# Patient Record
Sex: Female | Born: 1960
Health system: Southern US, Community
[De-identification: ages and names within clinical notes are randomized; demographics above are authoritative.]

## PROBLEM LIST (undated history)

## (undated) DIAGNOSIS — G43909 Migraine, unspecified, not intractable, without status migrainosus: Secondary | ICD-10-CM

## (undated) DIAGNOSIS — K219 Gastro-esophageal reflux disease without esophagitis: Secondary | ICD-10-CM

## (undated) DIAGNOSIS — F5104 Psychophysiologic insomnia: Secondary | ICD-10-CM

## (undated) DIAGNOSIS — IMO0001 Reserved for inherently not codable concepts without codable children: Secondary | ICD-10-CM

## (undated) HISTORY — DX: Reserved for inherently not codable concepts without codable children: IMO0001

## (undated) HISTORY — DX: Migraine, unspecified, not intractable, without status migrainosus: G43.909

## (undated) HISTORY — DX: Psychophysiologic insomnia: F51.04

## (undated) HISTORY — DX: Gastro-esophageal reflux disease without esophagitis: K21.9

## (undated) HISTORY — PX: APPENDECTOMY: SHX54

## (undated) HISTORY — PX: CHOLECYSTECTOMY: SHX55

---

## 2001-01-28 ENCOUNTER — Encounter: Payer: Self-pay | Admitting: Orthopaedic Surgery

## 2001-01-28 ENCOUNTER — Emergency Department (HOSPITAL_COMMUNITY): Admission: EM | Admit: 2001-01-28 | Discharge: 2001-01-28 | Payer: Self-pay | Admitting: *Deleted

## 2001-01-28 ENCOUNTER — Encounter: Payer: Self-pay | Admitting: Internal Medicine

## 2001-05-07 ENCOUNTER — Other Ambulatory Visit: Admission: RE | Admit: 2001-05-07 | Discharge: 2001-05-07 | Payer: Self-pay | Admitting: Obstetrics and Gynecology

## 2002-03-01 ENCOUNTER — Ambulatory Visit (HOSPITAL_COMMUNITY): Admission: RE | Admit: 2002-03-01 | Discharge: 2002-03-01 | Payer: Self-pay | Admitting: Family Medicine

## 2002-03-01 ENCOUNTER — Encounter: Payer: Self-pay | Admitting: Family Medicine

## 2002-03-15 ENCOUNTER — Ambulatory Visit (HOSPITAL_COMMUNITY): Admission: RE | Admit: 2002-03-15 | Discharge: 2002-03-15 | Payer: Self-pay | Admitting: Family Medicine

## 2002-03-15 ENCOUNTER — Encounter: Payer: Self-pay | Admitting: Family Medicine

## 2003-07-26 ENCOUNTER — Encounter: Payer: Self-pay | Admitting: Family Medicine

## 2003-07-26 ENCOUNTER — Ambulatory Visit (HOSPITAL_COMMUNITY): Admission: RE | Admit: 2003-07-26 | Discharge: 2003-07-26 | Payer: Self-pay | Admitting: Family Medicine

## 2004-10-14 HISTORY — PX: VAGINAL HYSTERECTOMY: SUR661

## 2004-10-17 ENCOUNTER — Observation Stay (HOSPITAL_COMMUNITY): Admission: RE | Admit: 2004-10-17 | Discharge: 2004-10-18 | Payer: Self-pay | Admitting: Obstetrics & Gynecology

## 2006-05-06 ENCOUNTER — Ambulatory Visit (HOSPITAL_COMMUNITY): Admission: RE | Admit: 2006-05-06 | Discharge: 2006-05-06 | Payer: Self-pay | Admitting: Family Medicine

## 2006-05-06 IMAGING — CR DG SHOULDER 2+V*L*
3 series · 3 of 3 positions shown · non-contrast
Comparison: none

CLINICAL DATA: Pain in left shoulder. 
 LEFT SHOULDER ? 3 VIEW:

[view not recorded (1 of 3)]
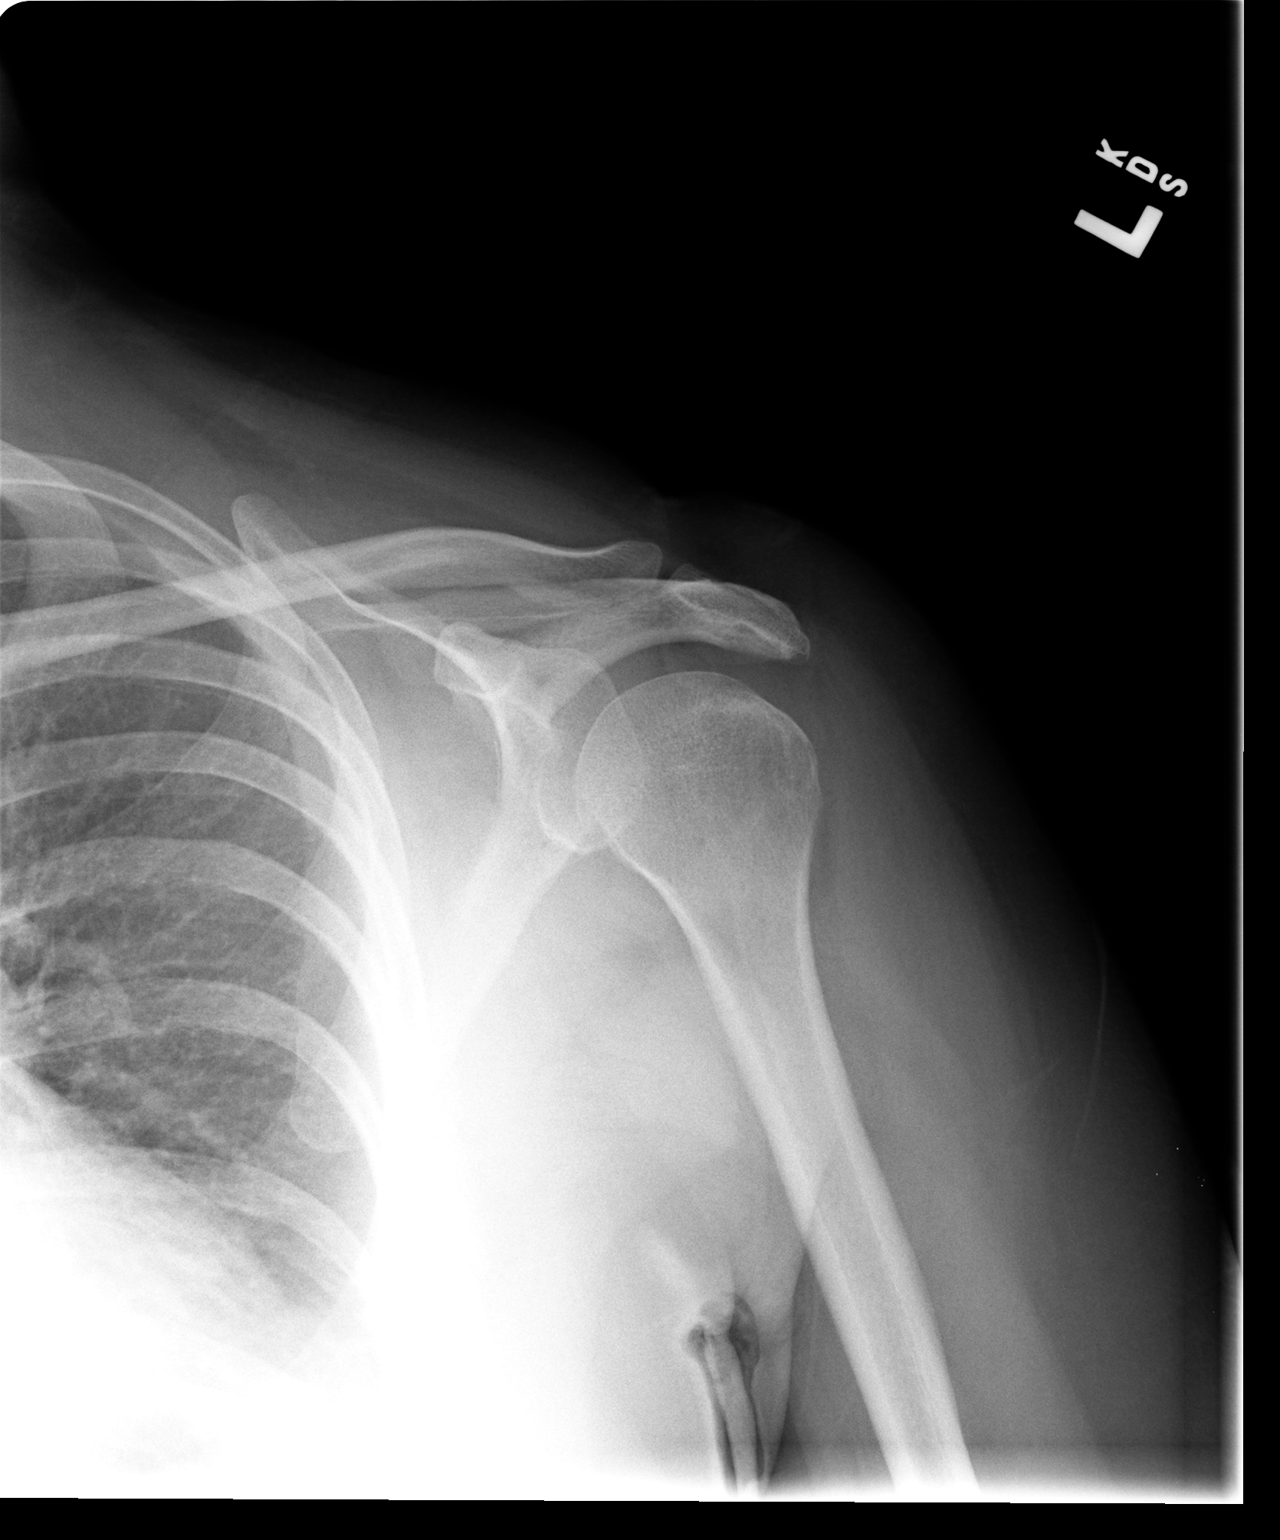

[view not recorded (2 of 3)]
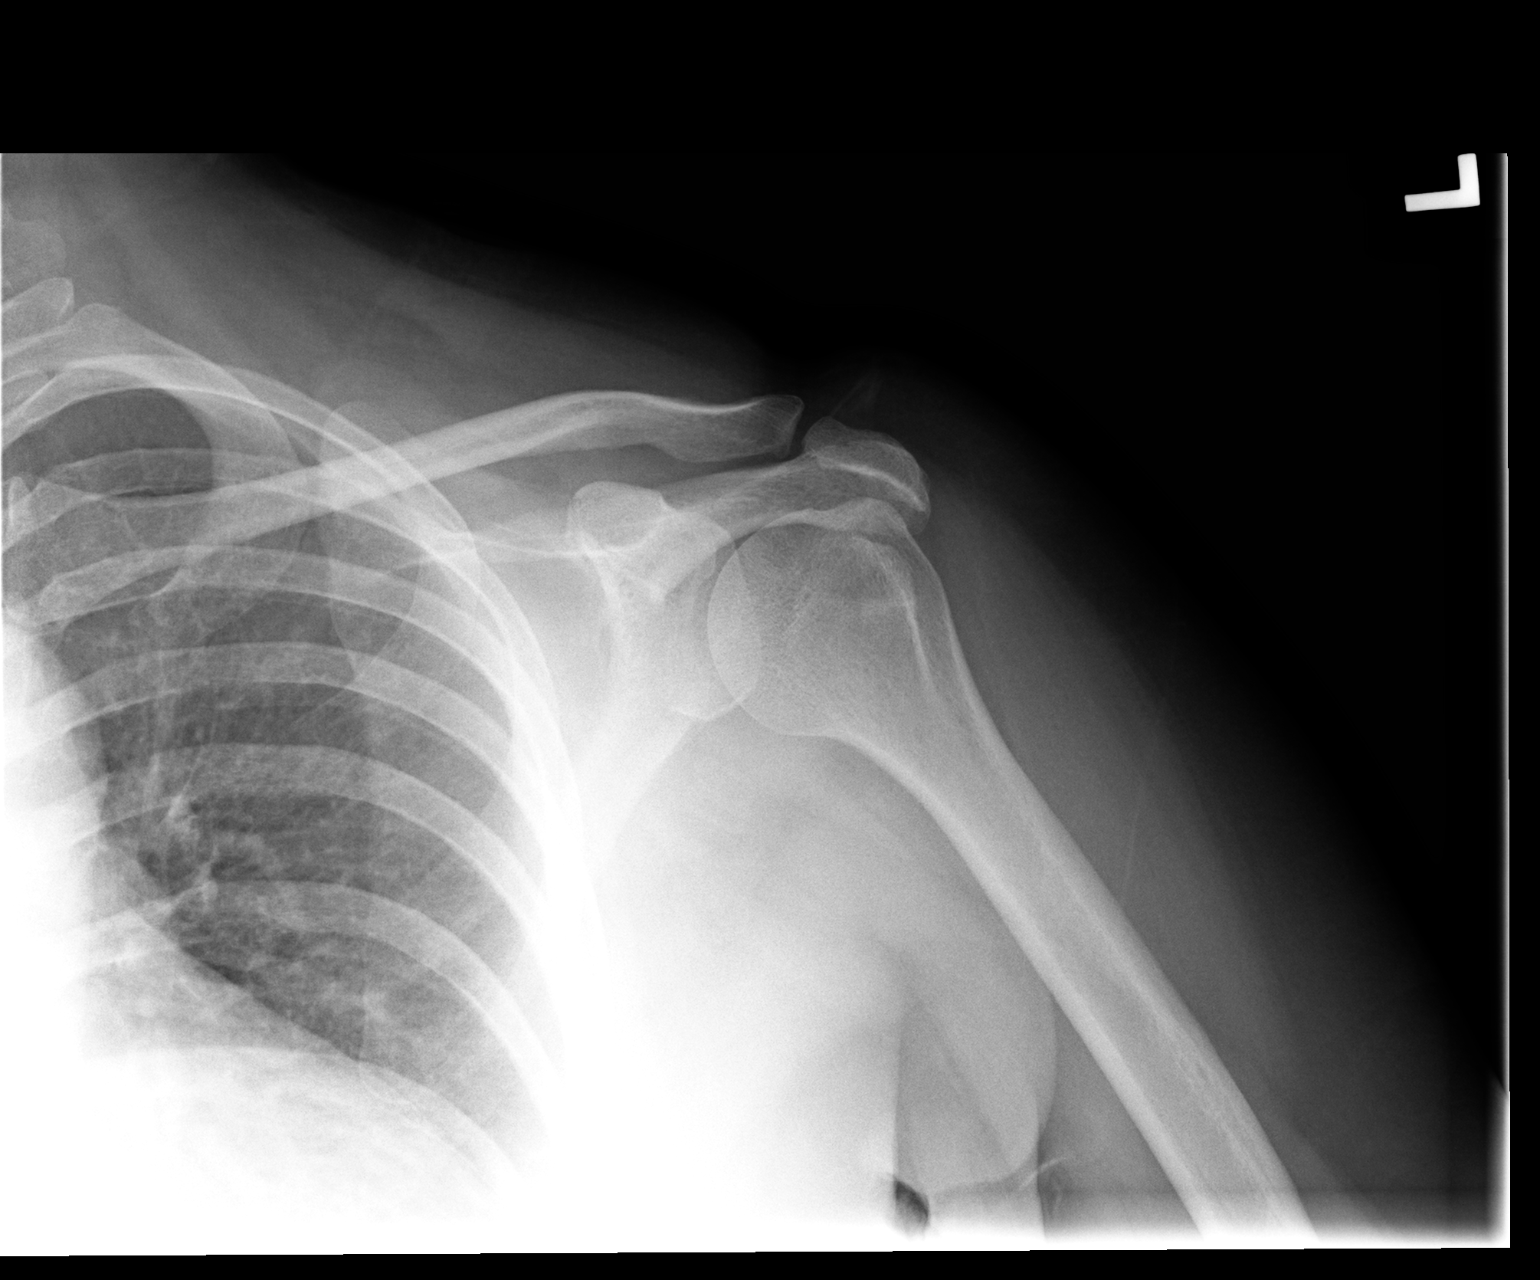

[view not recorded (3 of 3)]
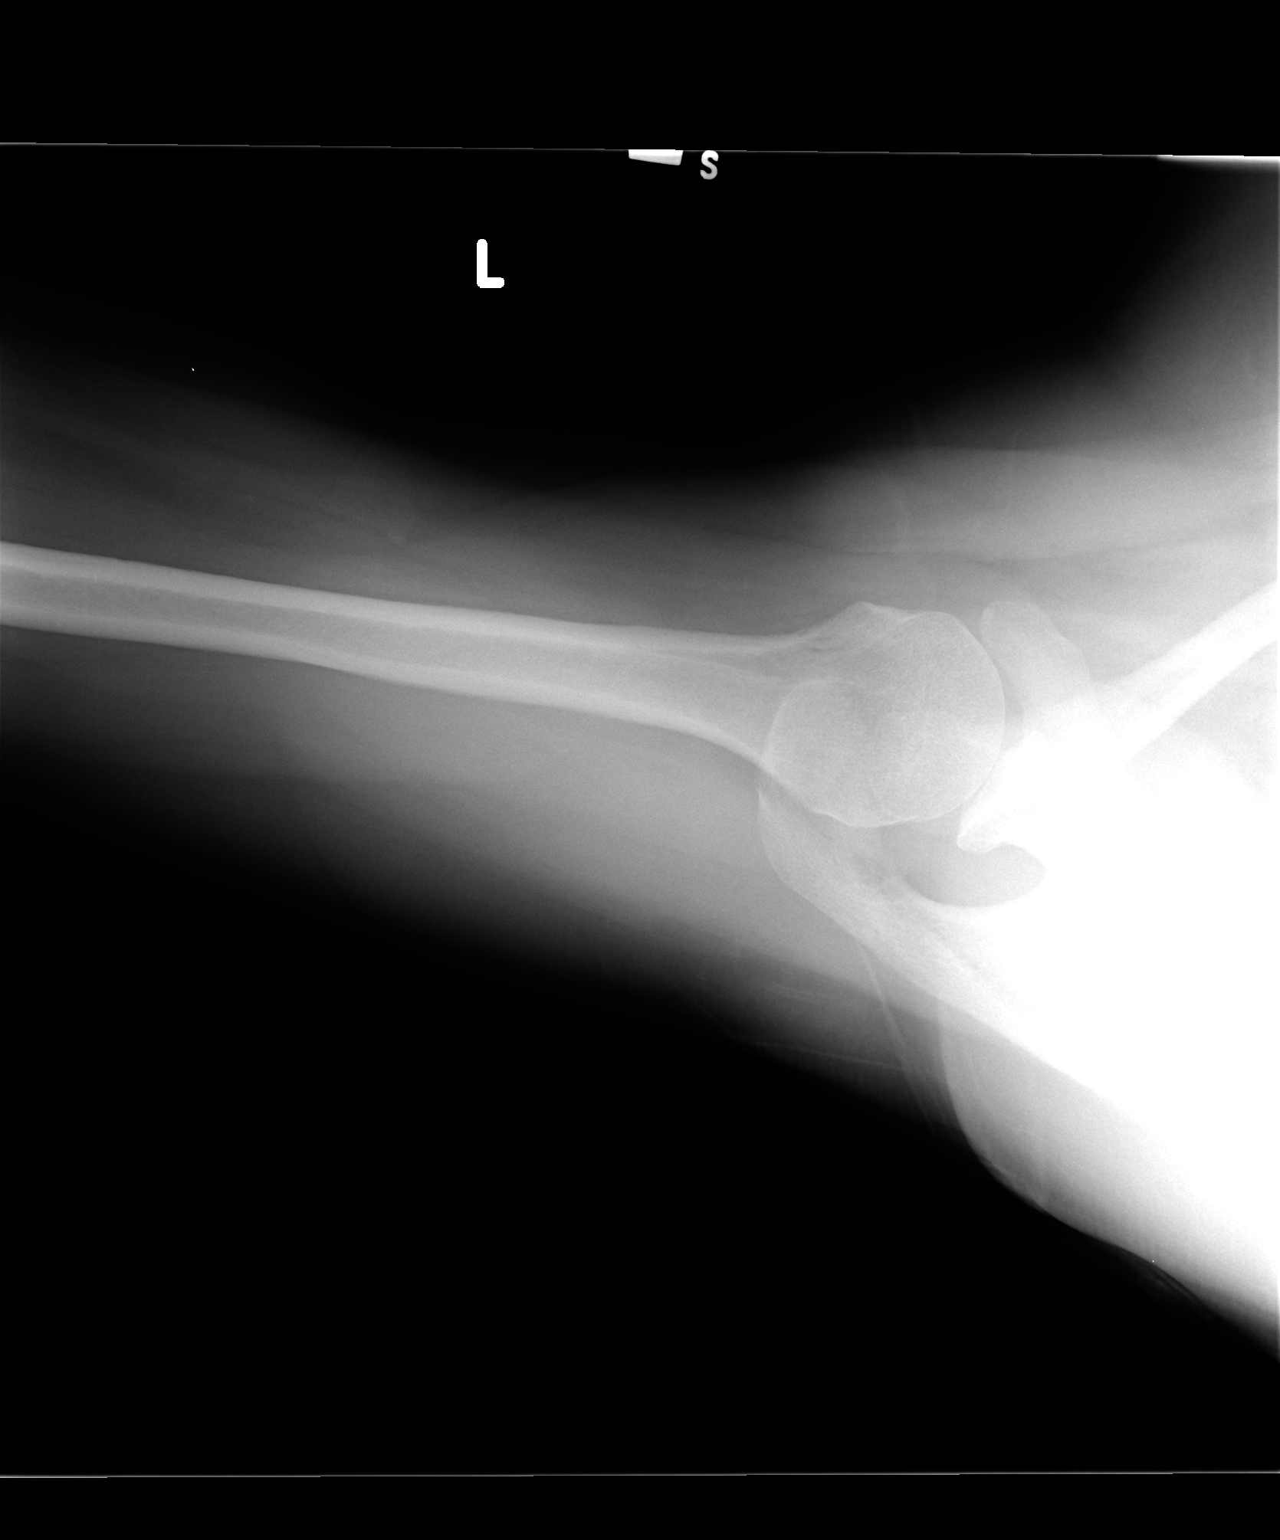

[3 of 3 positions shown; findings below may reference images not displayed]

FINDINGS: The humerus is located.  No fracture is identified.  Imaged ribs and lung are clear.  Some mild sclerosis in the humeral head at the greater tuberosity suggesting rotator cuff disease.  Mild acromioclavicular degenerative changes also noted.
IMPRESSION: No acute finding with mild acromioclavicular degenerative change and evidence of rotator cuff tendinopathy.

## 2006-09-29 ENCOUNTER — Inpatient Hospital Stay (HOSPITAL_COMMUNITY): Admission: EM | Admit: 2006-09-29 | Discharge: 2006-10-01 | Payer: Self-pay | Admitting: Emergency Medicine

## 2006-09-29 IMAGING — US US ABDOMEN COMPLETE
1 series · 14 of 25 positions shown · non-contrast
Comparison: None.

CLINICAL DATA: Chest pain, right upper quadrant pain.  Nausea, rule out gallbladder disease.
 ABDOMEN ULTRASOUND:
TECHNIQUE: Complete abdominal ultrasound examination was performed including evaluation of the liver, gallbladder, bile ducts, pancreas, kidneys, spleen, IVC, and abdominal aorta.

[Series 1: unknown · 0.34mm/px · 14 of 84 slices shown]
[im 1/84]
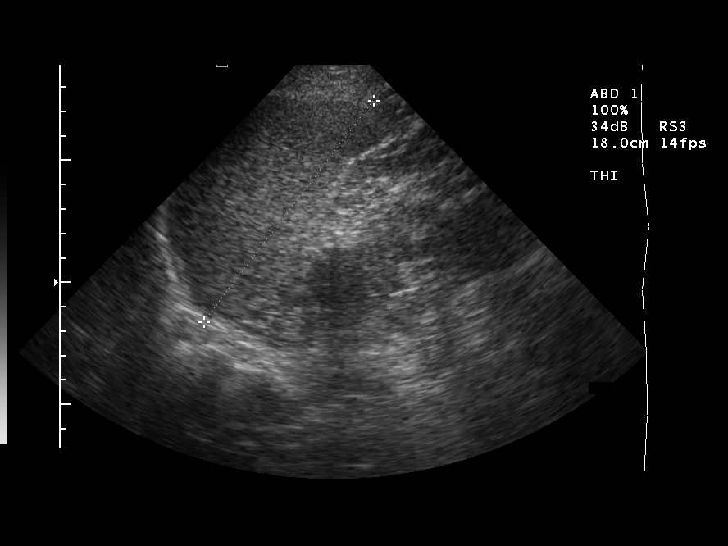
[im 7/84]
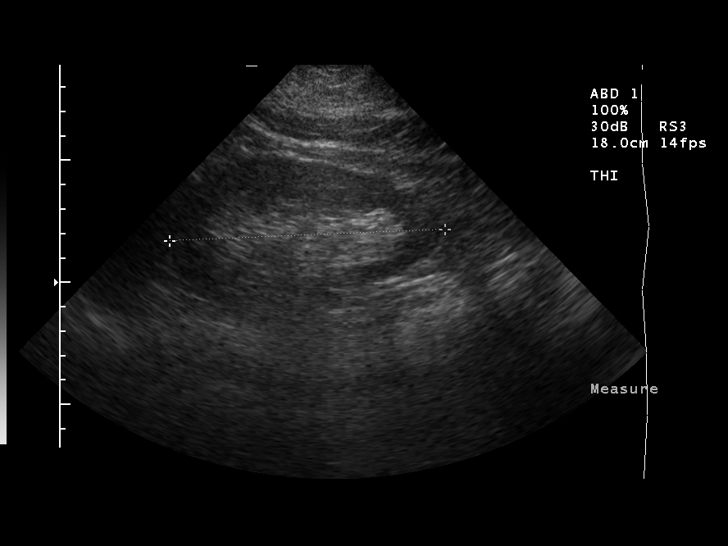
[im 14/84]
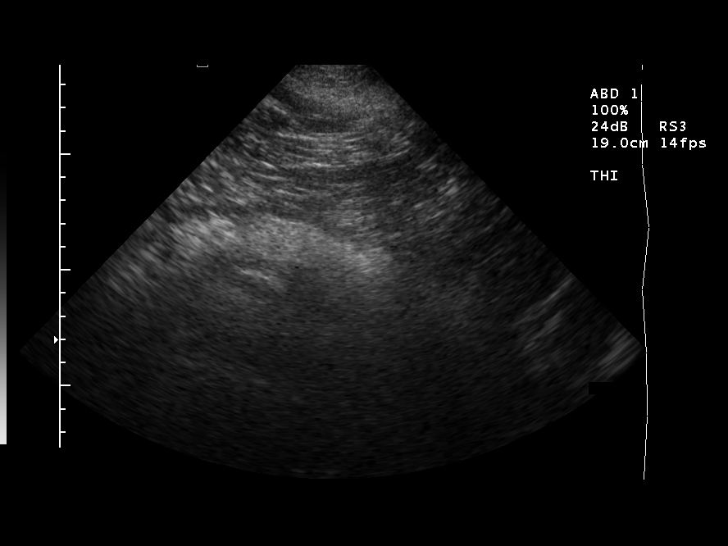
[im 21/84]
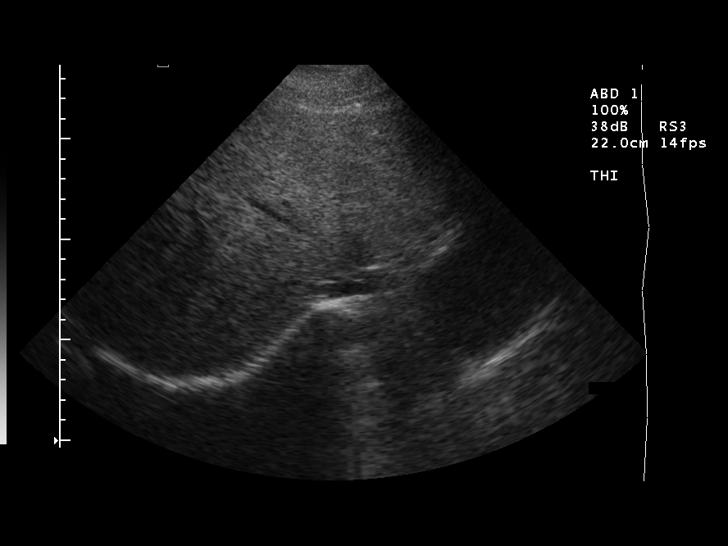
[im 28/84]
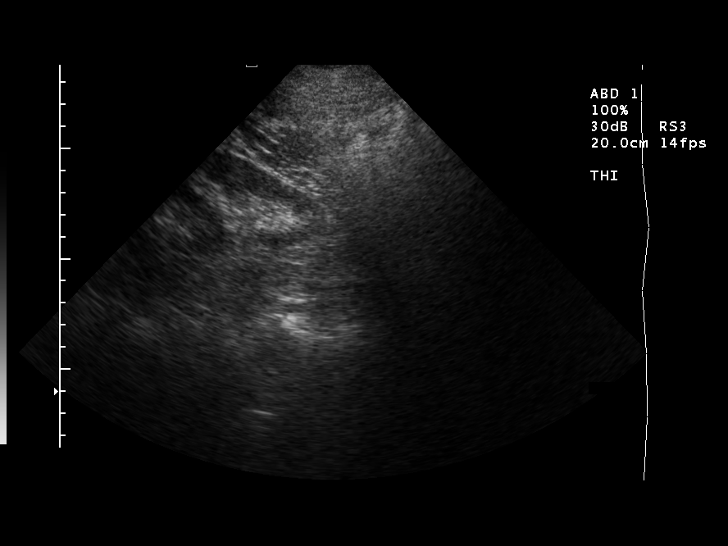
[im 32/84]
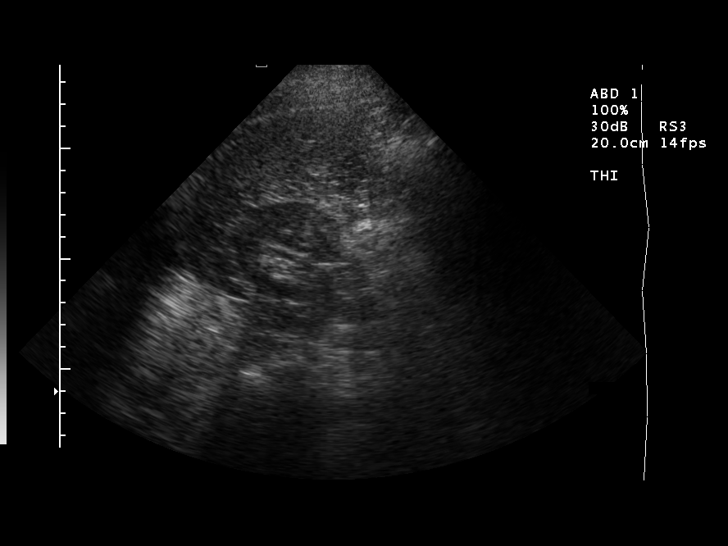
[im 39/84]
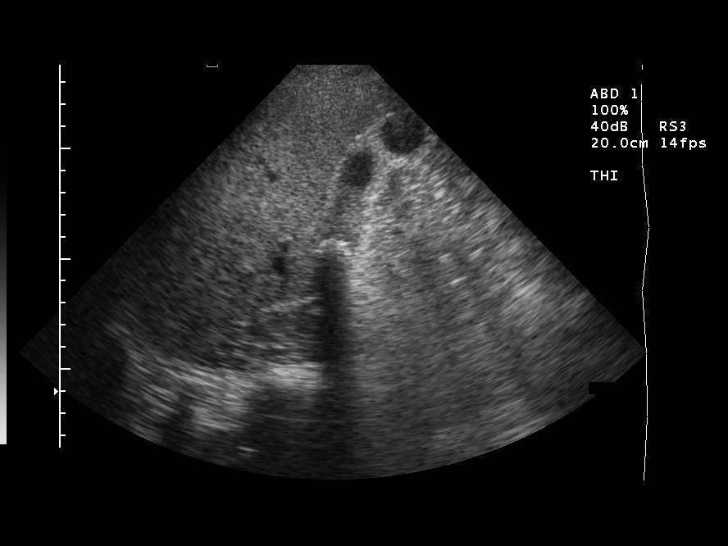
[im 45/84]
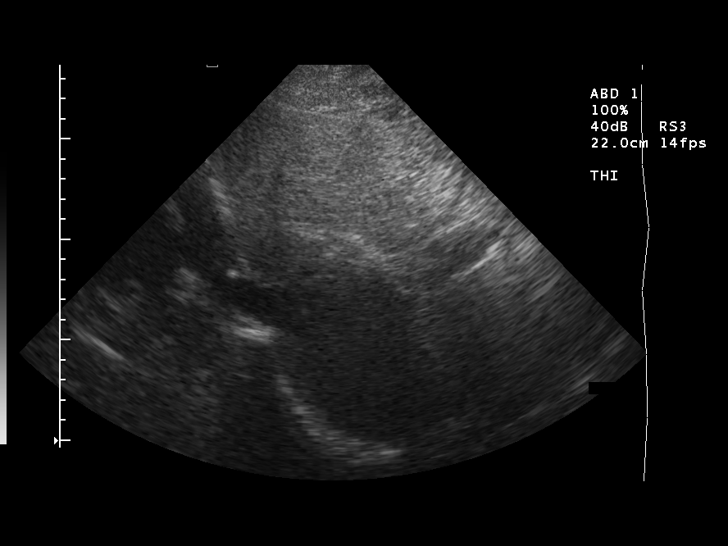
[im 52/84]
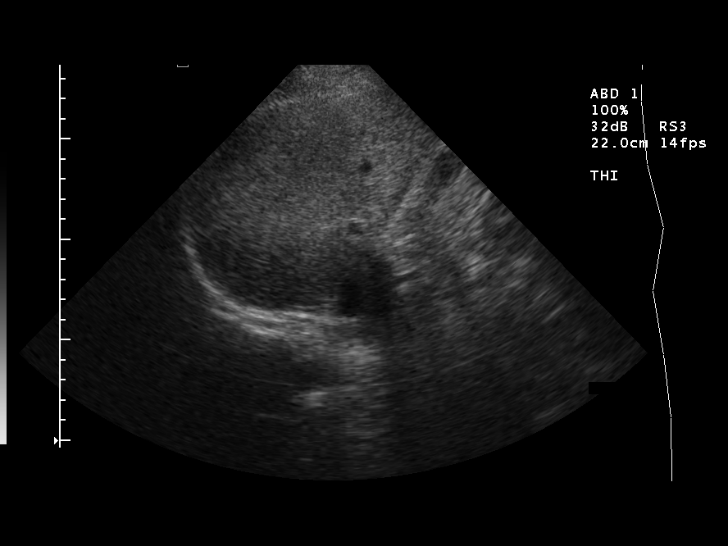
[im 56/84]
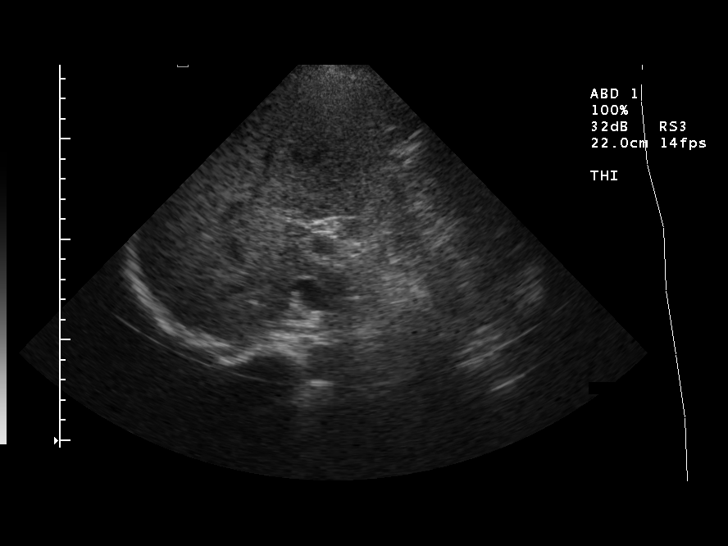
[im 63/84]
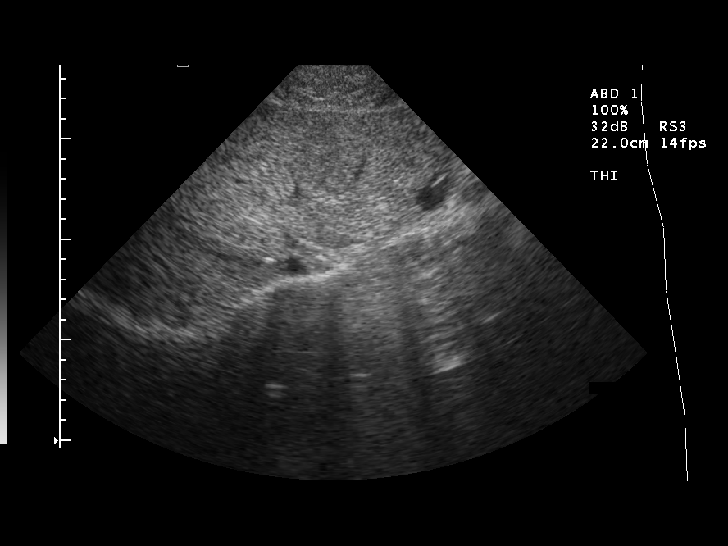
[im 70/84]
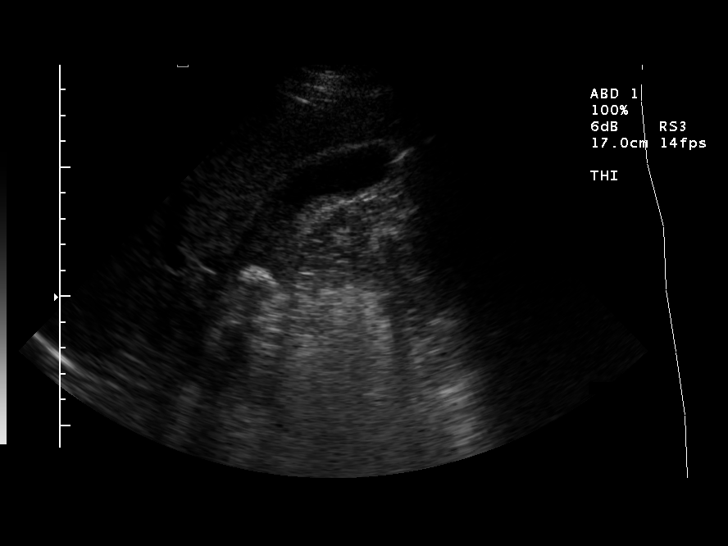
[im 77/84]
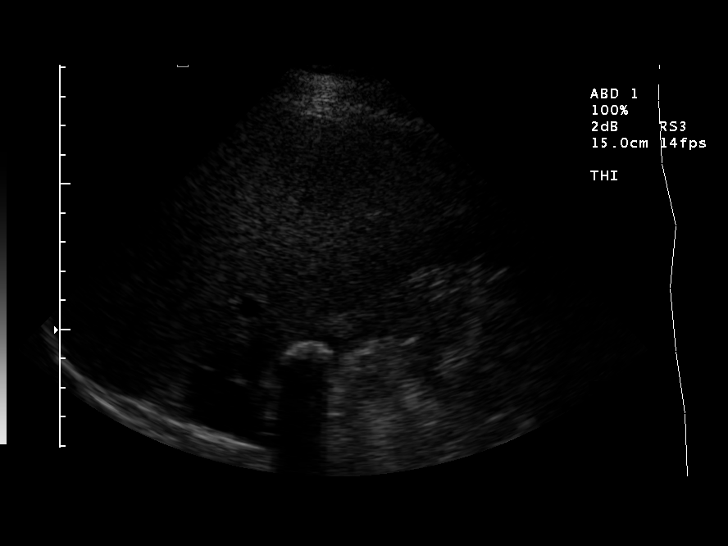
[im 84/84]
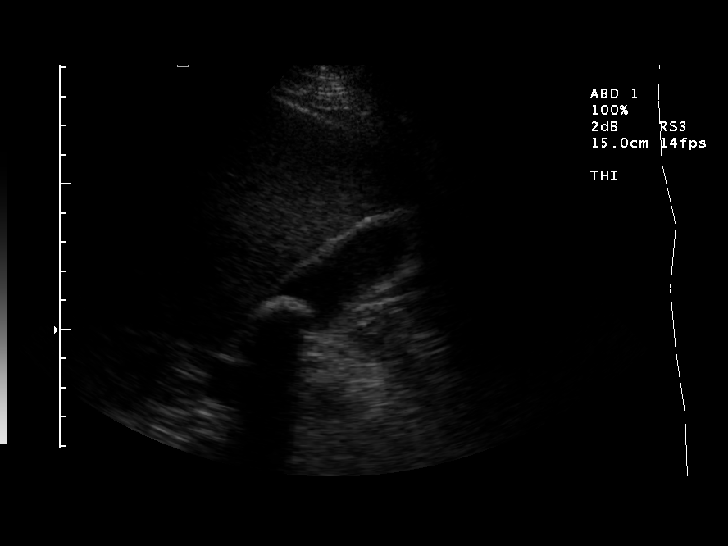

[14 of 25 positions shown; findings below may reference images not displayed]

FINDINGS: There is a large, approximately 2 cm, stone within the gallbladder neck.  There is no evidence of wall thickening, pericholecystic fluid, or sonographic Murphy?s sign. The common duct is at the upper limits of normal measuring 5 mm.
 Liver is of increased echogenicity, consistent with fatty infiltration.  There is left liver lobe 1.7 cm hepatic cyst.  
 The IVC is normal.  Pancreatic head and proximal body are normal.  The pancreatic tail is poorly visualized.  
 The spleen is normal in size and echo texture. 
 The right kidney is 11.0 and the left kidney is 11.3 cm.  No hydronephrosis.  
 The abdominal aorta is non aneurysmal.
IMPRESSION: 1.  Cholelithiasis without cholecystitis.  A 2 cm stone is seen within the gallbladder neck.
 2.  Fatty infiltration of the liver with a left liver lobe cyst.  
 3.  No biliary ductal dilatation.

## 2006-09-29 IMAGING — CR DG CHEST 1V PORT
1 series · 1 of 1 positions shown · non-contrast
Comparison: None available.

CLINICAL DATA: Chest pain.
 PORTABLE CHEST - 1 VIEW [DATE]:

[view not recorded]
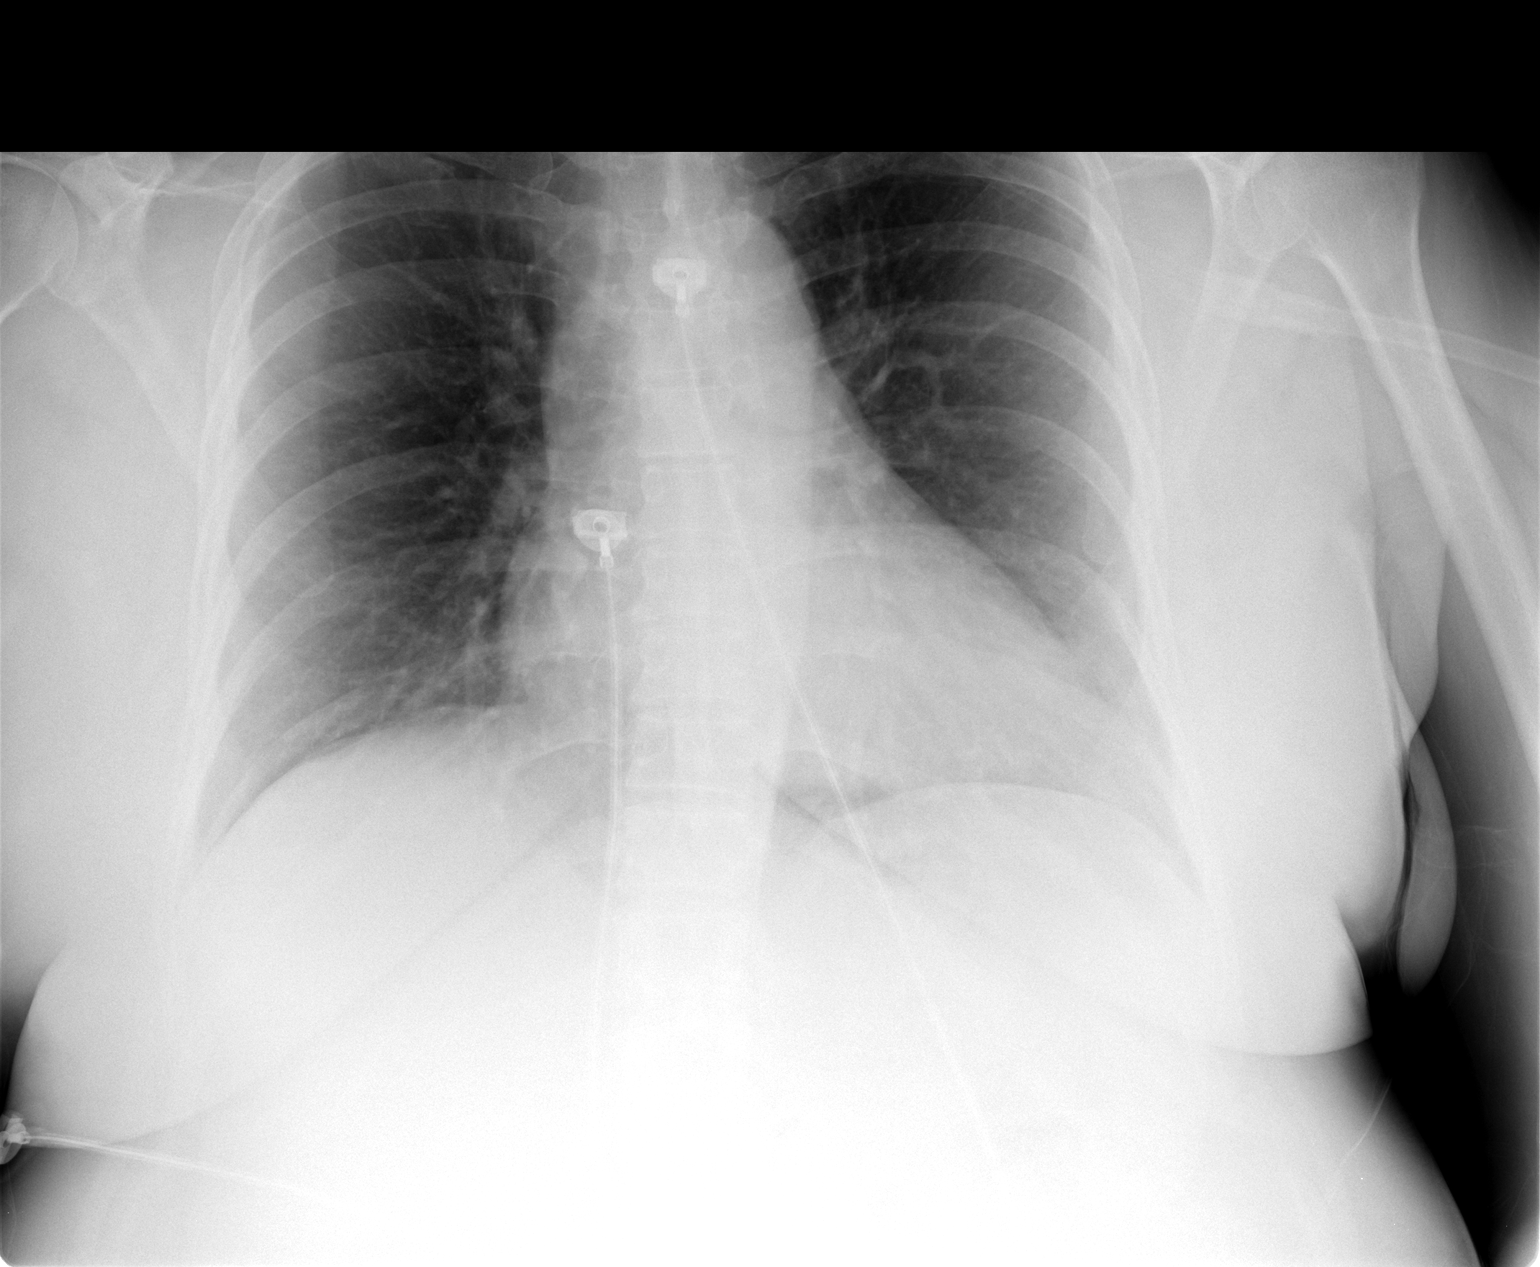

[1 of 1 positions shown; findings below may reference images not displayed]

FINDINGS: The lungs are clear and the costophrenic angles are sharp.  Heart size is normal.  No focal bony abnormality.
IMPRESSION: No acute disease.

## 2011-01-10 ENCOUNTER — Other Ambulatory Visit (HOSPITAL_COMMUNITY): Payer: Self-pay | Admitting: Family Medicine

## 2011-01-10 DIAGNOSIS — D171 Benign lipomatous neoplasm of skin and subcutaneous tissue of trunk: Secondary | ICD-10-CM

## 2011-01-16 ENCOUNTER — Encounter (HOSPITAL_COMMUNITY): Payer: Self-pay

## 2011-01-16 ENCOUNTER — Ambulatory Visit (HOSPITAL_COMMUNITY)
Admission: RE | Admit: 2011-01-16 | Discharge: 2011-01-16 | Disposition: A | Discharge status: Home / Self Care | Payer: Managed Care, Other (non HMO) | Source: Ambulatory Visit | Attending: Family Medicine | Admitting: Family Medicine

## 2011-01-16 ENCOUNTER — Other Ambulatory Visit (HOSPITAL_COMMUNITY): Payer: Self-pay | Admitting: Family Medicine

## 2011-01-16 DIAGNOSIS — D171 Benign lipomatous neoplasm of skin and subcutaneous tissue of trunk: Secondary | ICD-10-CM

## 2011-01-16 DIAGNOSIS — N63 Unspecified lump in unspecified breast: Secondary | ICD-10-CM | POA: Insufficient documentation

## 2011-01-16 IMAGING — US US BREAST*R*
1 series · 11 of 11 positions shown · non-contrast
Comparison: None.

CLINICAL DATA: Due for annual mammogram.   Palpable mass right
lateral chest wall, probable lipoma.

DIGITAL DIAGNOSTIC BILATERAL MAMMOGRAM WITH CAD AND RIGHT BREAST
ULTRASOUND:

[Series 1: us breast*right* · 0.08mm/px · 11 of 11 slices shown]
[im 1/11]
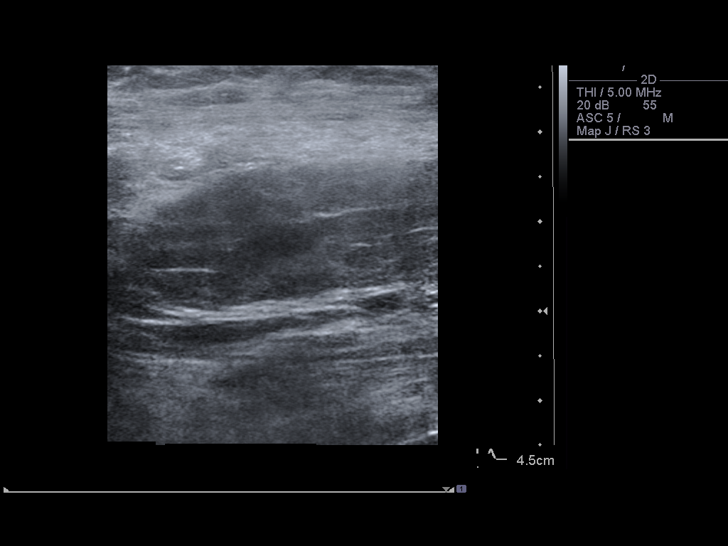
[im 2/11]
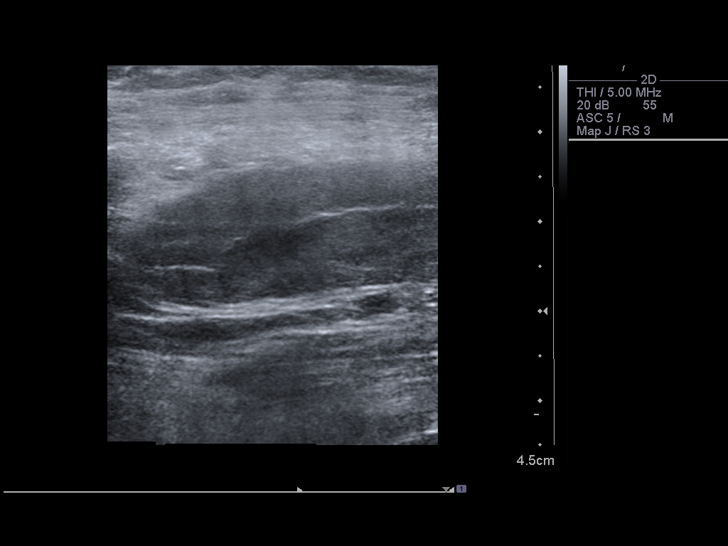
[im 3/11]
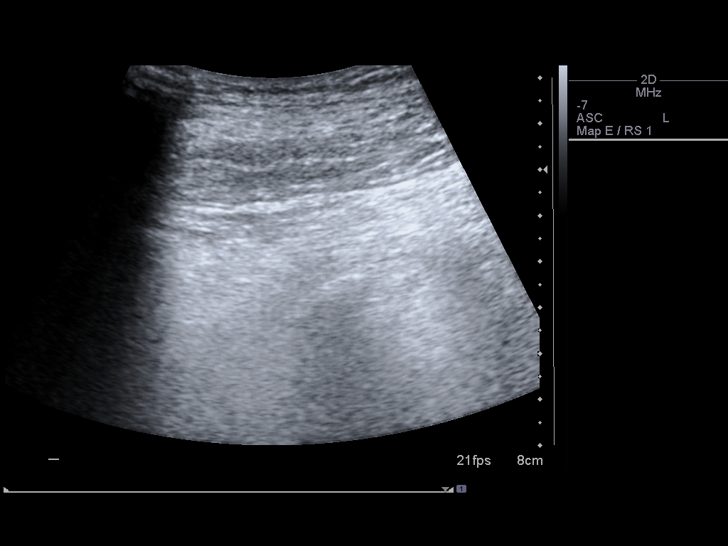
[im 4/11]
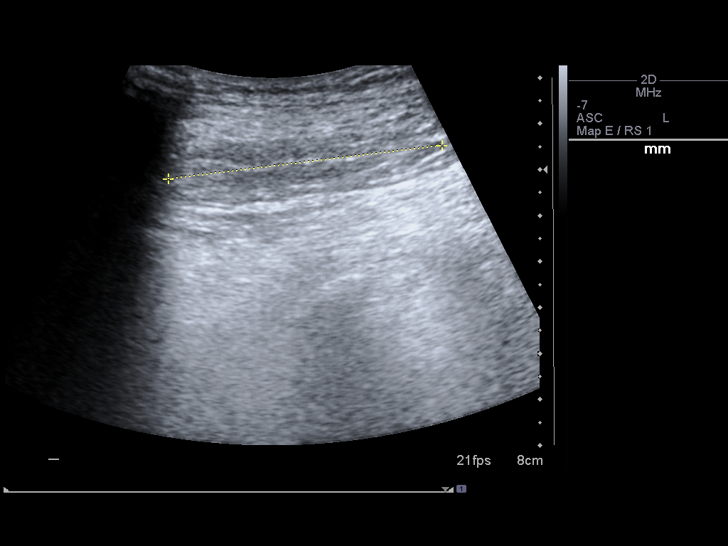
[im 5/11]
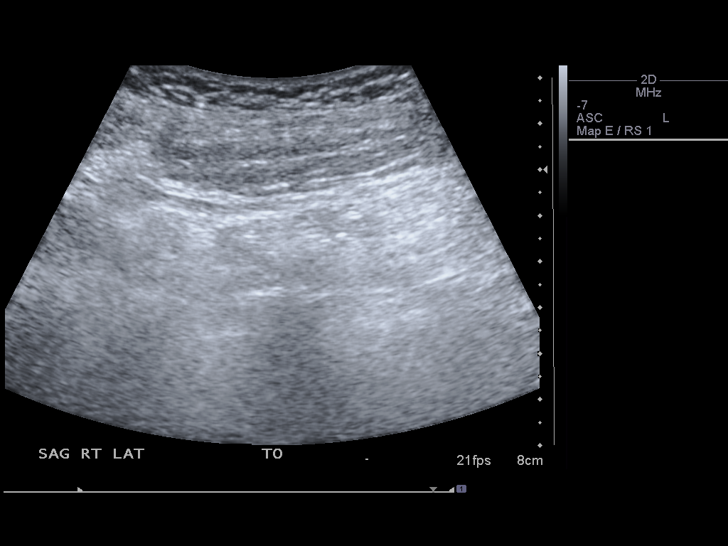
[im 6/11]
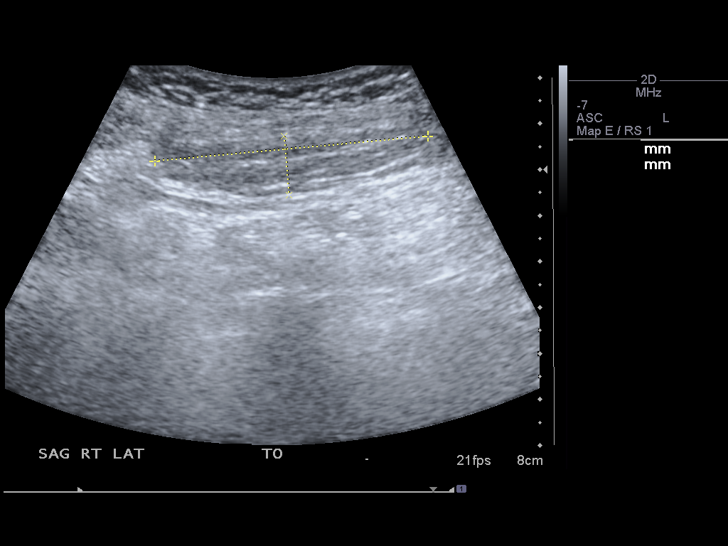
[im 7/11]
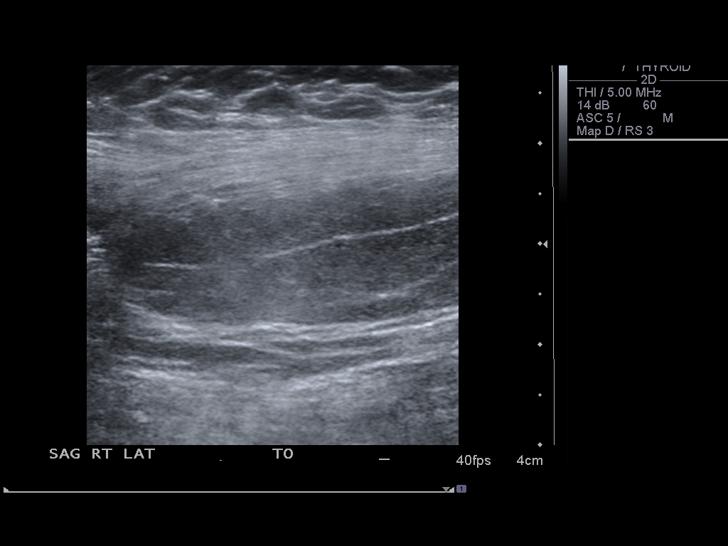
[im 8/11]
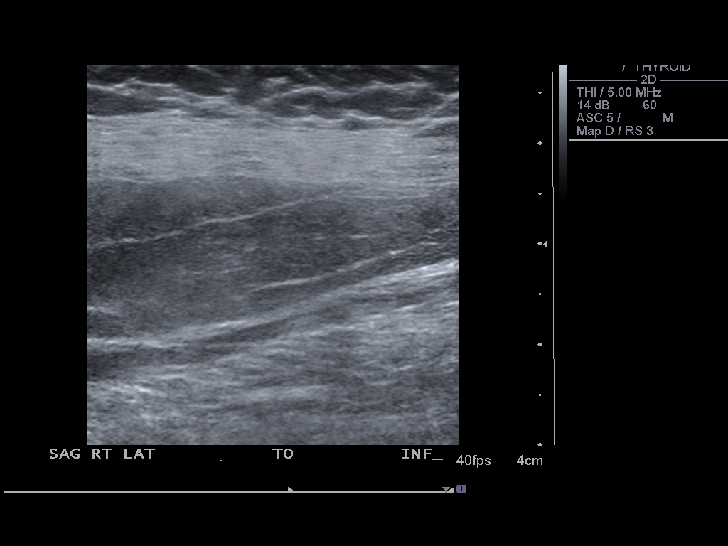
[im 9/11]
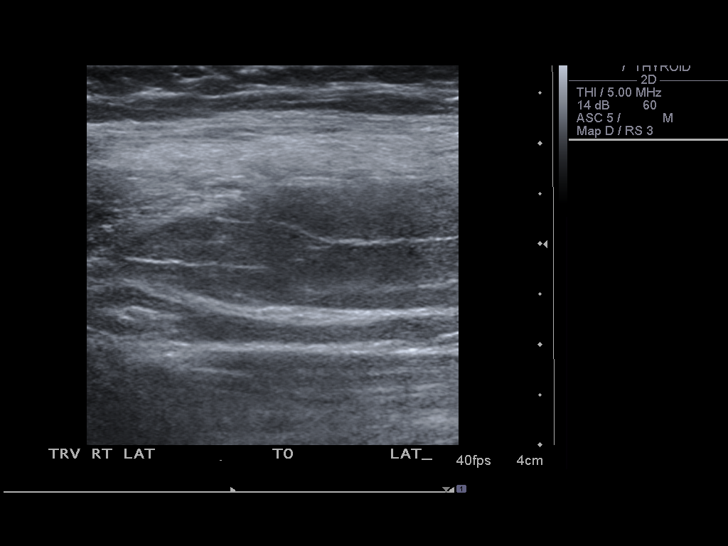
[im 10/11]
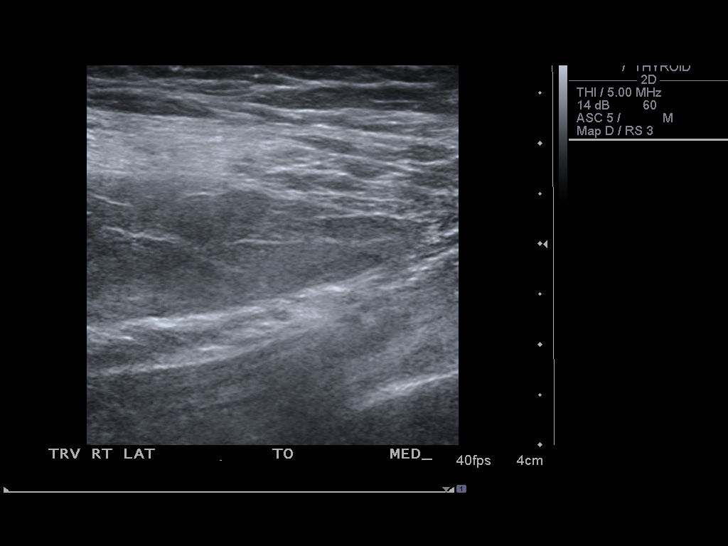
[im 11/11]
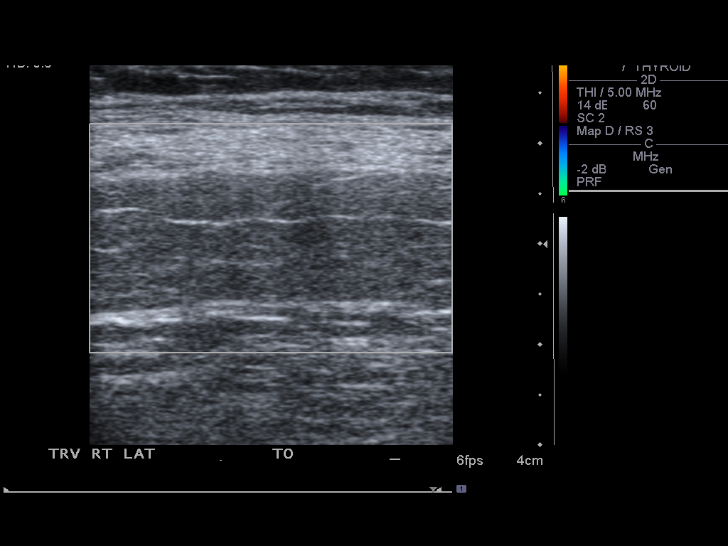

[11 of 11 positions shown; findings below may reference images not displayed]

FINDINGS: The breast parenchyma is heterogeneously dense
bilaterally.  No mass, distortion, or suspicious microcalcification
is identified in either breast to suggest malignancy. The area of
patient palpable concern along the right chest wall is not included
in the mammogram, due to its far lateral position.

Mammographic images were processed with CAD.

On physical exam, I palpate a discrete oval smoothly marginated and
mobile mass in the subcutaneous tissues of the lateral right chest
wall, directly inferior to the lateral right axilla. On physical
examination, the mass measures 6 to 7 cm.

Ultrasound is performed, showing an oval smoothly marginated mass,
isoechoic to subcutaneous fat within the subcutaneous tissues of
the right lateral chest wall.  The mass is oriented parallel to the
chest wall and contains internal echogenic bands.  The mass is
estimated be 6.0 x 6.0 x 1.3 cm.  No internal vascular flow is
identified.
IMPRESSION: 1.  No evidence of malignancy in either breast.
2.  Palpable mass lateral right chest wall measures approximately
6.0 cm and has ultrasound features compatible with a benign lipoma.

Bilateral screening mammogram in one years recommended.

BI-RADS CATEGORY 2:  Benign finding(s).

## 2012-10-14 HISTORY — PX: LAPAROSCOPIC BILATERAL SALPINGO OOPHERECTOMY: SHX5890

## 2013-03-17 ENCOUNTER — Encounter: Payer: Self-pay | Admitting: *Deleted

## 2013-03-18 ENCOUNTER — Encounter: Payer: Self-pay | Admitting: Nurse Practitioner

## 2013-03-18 ENCOUNTER — Ambulatory Visit (INDEPENDENT_AMBULATORY_CARE_PROVIDER_SITE_OTHER): Payer: Managed Care, Other (non HMO) | Admitting: Nurse Practitioner

## 2013-03-18 VITALS — BP 116/80 | Temp 98.8°F | Wt 201.8 lb

## 2013-03-18 DIAGNOSIS — R7301 Impaired fasting glucose: Secondary | ICD-10-CM

## 2013-03-18 DIAGNOSIS — G43909 Migraine, unspecified, not intractable, without status migrainosus: Secondary | ICD-10-CM | POA: Insufficient documentation

## 2013-03-18 DIAGNOSIS — R300 Dysuria: Secondary | ICD-10-CM

## 2013-03-18 DIAGNOSIS — R81 Glycosuria: Secondary | ICD-10-CM

## 2013-03-18 LAB — POCT URINALYSIS DIPSTICK
Glucose, UA: 50
Spec Grav, UA: >=1.03
pH, UA: 5

## 2013-03-18 LAB — POCT UA - MICROSCOPIC ONLY
Bacteria, U Microscopic: 0
Crystals, Ur, HPF, POC: 0
Epithelial cells, urine per micros: 0
Mucus, UA: 0
RBC, urine, microscopic: 0
WBC, Ur, HPF, POC: 0

## 2013-03-18 LAB — GLUCOSE, POCT (MANUAL RESULT ENTRY): POC Glucose: 193 mg/dl — AB (ref 70–99)

## 2013-03-18 LAB — POCT GLYCOSYLATED HEMOGLOBIN (HGB A1C): Hemoglobin A1C: 5.7

## 2013-03-18 MED ORDER — NITROFURANTOIN MONOHYD MACRO 100 MG PO CAPS: 100.0000 mg | ORAL_CAPSULE | Freq: Two times a day (BID) | ORAL | Status: DC

## 2013-03-18 NOTE — Progress Notes (Signed)
Subjective:  Presents with complaints of urinary pressure frequency and urgency that began about a week ago. Patient had some antibiotics at home which she took for several days, symptoms have almost resolved. Thinks the medicine was clindamycin. Some low back pain. No fever. No CVA area tenderness. No history of recent UTI. Same sexual partner. Less than 2 hours ago patient had a chicken wrap and sweet tea. Note she has had an appendectomy.  Objective:   BP 116/80  Temp(Src) 98.8 F (37.1 C) (Oral)  Wt 201 lb 12.8 oz (91.536 kg) NAD. Alert, oriented. Significant central obesity noted. Lungs clear. No CVA area tenderness. Heart regular rate rhythm. Abdomen soft nondistended with suprapubic area tenderness on exam. Urine dipstick shows positive for glucose. Nonfasting blood sugar 193, hemoglobin A1c 5.7. Urine microscopic negative.  Assessment:Dysuria - Plan: POCT urinalysis dipstick, POCT UA - Microscopic Only  Impaired fasting glucose - Plan: POCT glycosylated hemoglobin (Hb A1C), POCT glucose (manual entry)  Glycosuria  possible impaired fasting glucose, recommend FBS Plan: Macrobid 100 mg 1 by mouth twice a day x5 days. Discussed importance of weight loss particularly around the mid section. Increase activity. Healthy diet including decreasing sugar and simple carbs. Recommend checking a fasting blood sugar sometime in the next few months.

## 2013-03-25 ENCOUNTER — Telehealth: Payer: Self-pay | Admitting: *Deleted

## 2013-03-25 NOTE — Telephone Encounter (Signed)
Not available

## 2013-04-08 DIAGNOSIS — Z029 Encounter for administrative examinations, unspecified: Secondary | ICD-10-CM

## 2013-04-22 ENCOUNTER — Emergency Department (HOSPITAL_COMMUNITY)
Admission: EM | Admit: 2013-04-22 | Discharge: 2013-04-22 | Disposition: A | Discharge status: Home / Self Care | Payer: Managed Care, Other (non HMO) | Attending: Emergency Medicine | Admitting: Emergency Medicine

## 2013-04-22 ENCOUNTER — Emergency Department (HOSPITAL_COMMUNITY): Payer: Managed Care, Other (non HMO)

## 2013-04-22 ENCOUNTER — Encounter (HOSPITAL_COMMUNITY): Payer: Self-pay

## 2013-04-22 DIAGNOSIS — Z90710 Acquired absence of both cervix and uterus: Secondary | ICD-10-CM | POA: Insufficient documentation

## 2013-04-22 DIAGNOSIS — R110 Nausea: Secondary | ICD-10-CM | POA: Insufficient documentation

## 2013-04-22 DIAGNOSIS — Z7982 Long term (current) use of aspirin: Secondary | ICD-10-CM | POA: Insufficient documentation

## 2013-04-22 DIAGNOSIS — Z8719 Personal history of other diseases of the digestive system: Secondary | ICD-10-CM | POA: Insufficient documentation

## 2013-04-22 DIAGNOSIS — N83209 Unspecified ovarian cyst, unspecified side: Secondary | ICD-10-CM

## 2013-04-22 DIAGNOSIS — G43909 Migraine, unspecified, not intractable, without status migrainosus: Secondary | ICD-10-CM | POA: Insufficient documentation

## 2013-04-22 DIAGNOSIS — K219 Gastro-esophageal reflux disease without esophagitis: Secondary | ICD-10-CM | POA: Insufficient documentation

## 2013-04-22 DIAGNOSIS — G4700 Insomnia, unspecified: Secondary | ICD-10-CM | POA: Insufficient documentation

## 2013-04-22 DIAGNOSIS — Z79899 Other long term (current) drug therapy: Secondary | ICD-10-CM | POA: Insufficient documentation

## 2013-04-22 LAB — CBC WITH DIFFERENTIAL/PLATELET
Basophils Absolute: 0 10*3/uL (ref 0.0–0.1)
Basophils Relative: 0 % (ref 0–1)
Eosinophils Absolute: 0.1 10*3/uL (ref 0.0–0.7)
Eosinophils Relative: 2 % (ref 0–5)
HCT: 39.4 % (ref 36.0–46.0)
Hemoglobin: 13.4 g/dL (ref 12.0–15.0)
Lymphocytes Relative: 32 % (ref 12–46)
Lymphs Abs: 2.3 10*3/uL (ref 0.7–4.0)
MCH: 29.3 pg (ref 26.0–34.0)
MCHC: 34 g/dL (ref 30.0–36.0)
MCV: 86.2 fL (ref 78.0–100.0)
Monocytes Absolute: 0.4 10*3/uL (ref 0.1–1.0)
Monocytes Relative: 6 % (ref 3–12)
Neutro Abs: 4.4 10*3/uL (ref 1.7–7.7)
Neutrophils Relative %: 60 % (ref 43–77)
Platelets: 310 10*3/uL (ref 150–400)
RBC: 4.57 MIL/uL (ref 3.87–5.11)
RDW: 12.8 % (ref 11.5–15.5)
WBC: 7.3 10*3/uL (ref 4.0–10.5)

## 2013-04-22 LAB — URINALYSIS, ROUTINE W REFLEX MICROSCOPIC
Bilirubin Urine: NEGATIVE
Glucose, UA: NEGATIVE mg/dL
Hgb urine dipstick: NEGATIVE
Ketones, ur: NEGATIVE mg/dL
Leukocytes, UA: NEGATIVE
Nitrite: NEGATIVE
Protein, ur: NEGATIVE mg/dL
Specific Gravity, Urine: >1.03 — ABNORMAL HIGH (ref 1.005–1.030)
Urobilinogen, UA: 0.2 mg/dL (ref 0.0–1.0)
pH: 5.5 (ref 5.0–8.0)

## 2013-04-22 LAB — COMPREHENSIVE METABOLIC PANEL
ALT: 26 U/L (ref 0–35)
AST: 16 U/L (ref 0–37)
Albumin: 3.8 g/dL (ref 3.5–5.2)
Alkaline Phosphatase: 59 U/L (ref 39–117)
BUN: 15 mg/dL (ref 6–23)
CO2: 28 mEq/L (ref 19–32)
Calcium: 9.2 mg/dL (ref 8.4–10.5)
Chloride: 104 mEq/L (ref 96–112)
Creatinine, Ser: 0.68 mg/dL (ref 0.50–1.10)
GFR calc Af Amer: >90 mL/min (ref >90)
GFR calc non Af Amer: >90 mL/min (ref >90)
Glucose, Bld: 117 mg/dL — ABNORMAL HIGH (ref 70–99)
Potassium: 3.7 mEq/L (ref 3.5–5.1)
Sodium: 138 mEq/L (ref 135–145)
Total Bilirubin: 0.5 mg/dL (ref 0.3–1.2)
Total Protein: 7 g/dL (ref 6.0–8.3)

## 2013-04-22 IMAGING — CT CT ABD-PELV W/ CM
2 of 4 series · 17 of 46 positions shown, 19 images · IV contrast (Omnipaque 300)
Comparison: No comparison CT.  [DATE] ultrasound.

CLINICAL DATA: Right lower quadrant pain for 2 weeks.  History
cholecystectomy, appendectomy and hysterectomy.

CT ABDOMEN AND PELVIS WITH CONTRAST
TECHNIQUE: Multidetector CT imaging of the abdomen and pelvis was
performed following the standard protocol during bolus
administration of intravenous contrast.
Contrast: 50mL OMNIPAQUE IOHEXOL 300 MG/ML  SOLN, 100mL OMNIPAQUE
IOHEXOL 300 MG/ML  SOLN

[Series 2: abd_pel_with 5.0 b40f · axial · 0.79mm/px · z∈[-473,-73]mm · 14 of 90 slices shown, 16 images]
[im 5/90  soft-tissue]
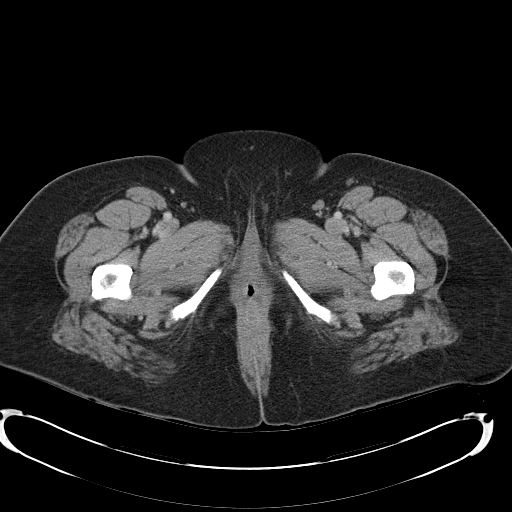
[im 5/90  bone]
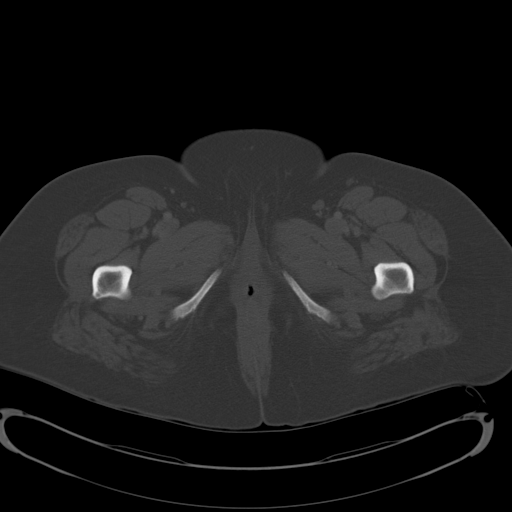
[im 13/90  soft-tissue]
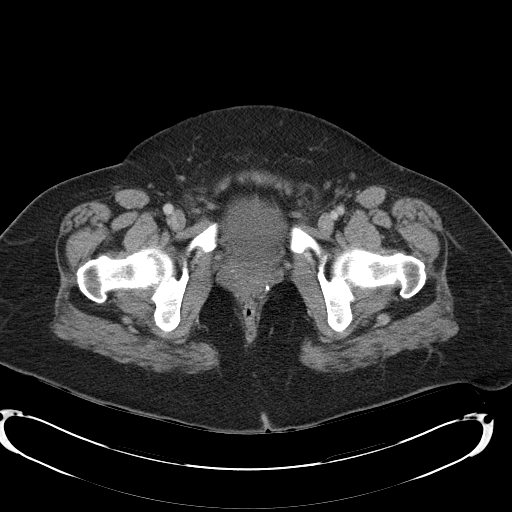
[im 17/90  soft-tissue]
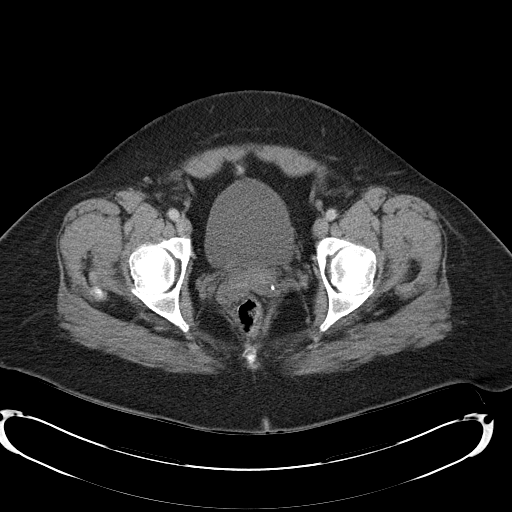
[im 26/90  soft-tissue]
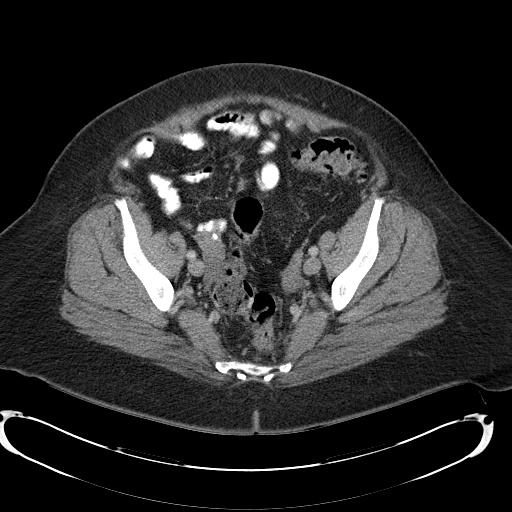
[im 30/90  soft-tissue]
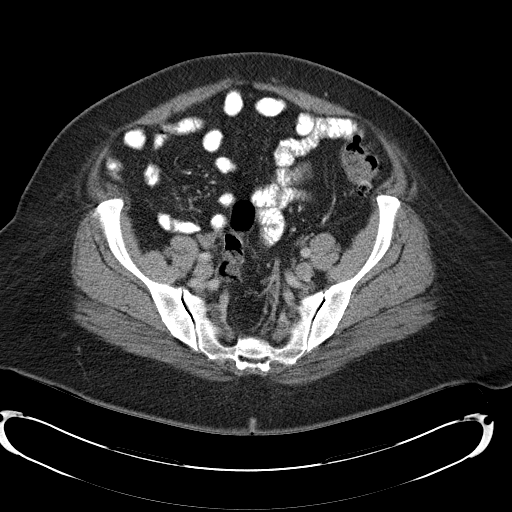
[im 34/90  soft-tissue]
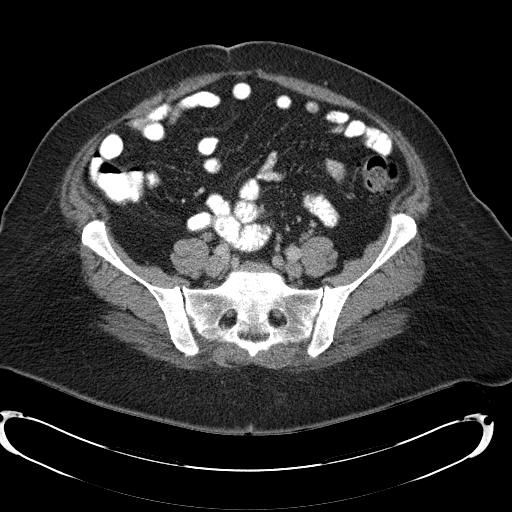
[im 43/90  soft-tissue]
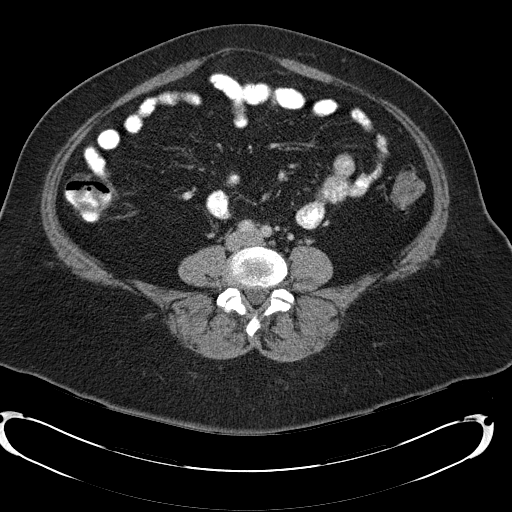
[im 47/90  soft-tissue]
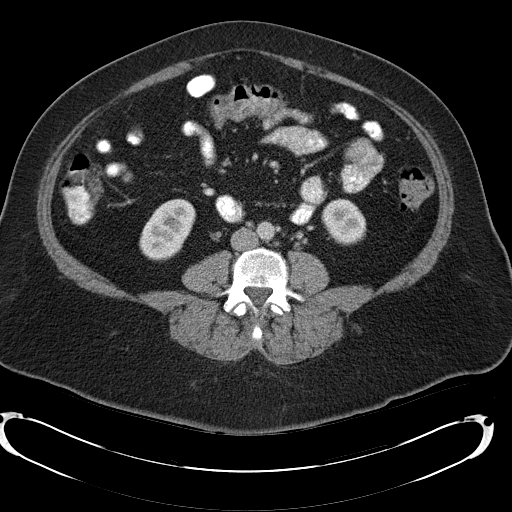
[im 56/90  soft-tissue]
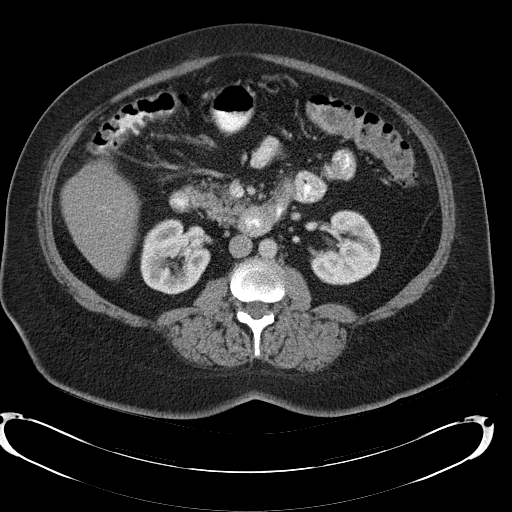
[im 56/90  bone]
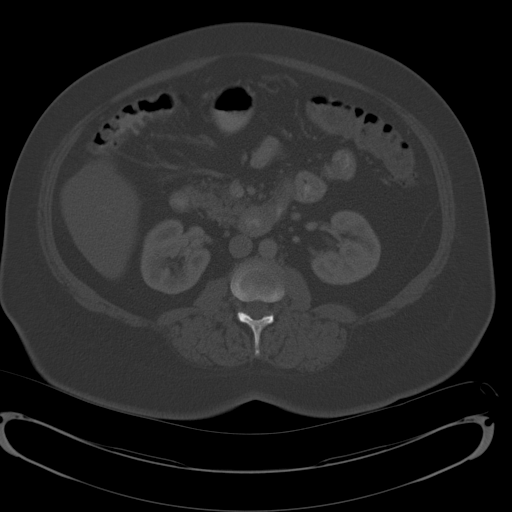
[im 60/90  soft-tissue]
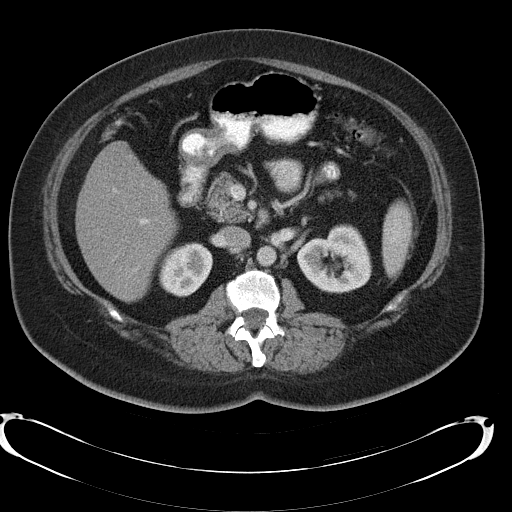
[im 68/90  soft-tissue]
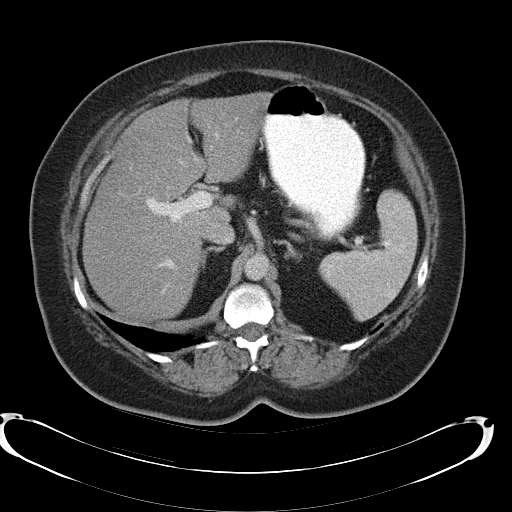
[im 73/90  soft-tissue]
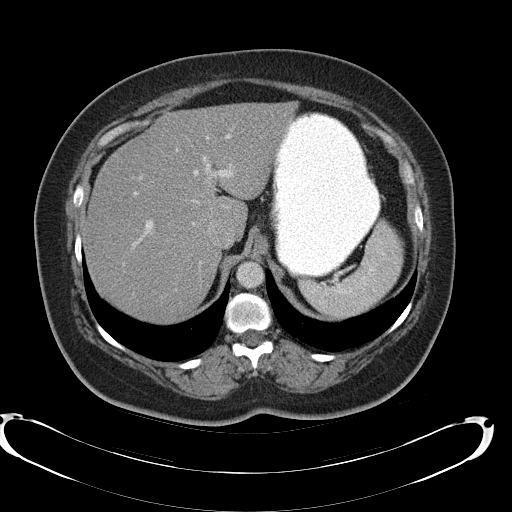
[im 77/90  soft-tissue]
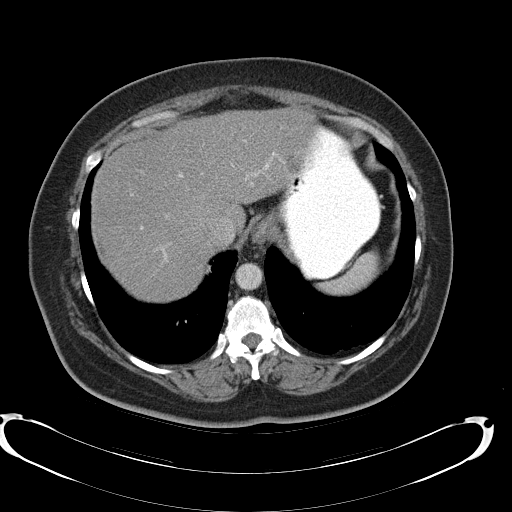
[im 85/90  soft-tissue]
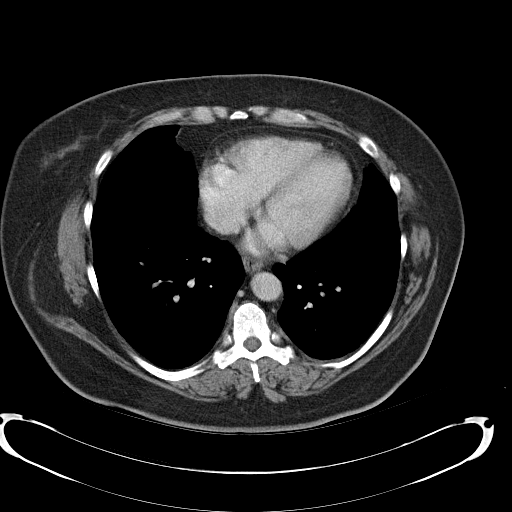

[Series 4: abd_pel_with 3.0 spo cor · coronal · 0.79mm/px · 3 of 93 slices shown]
[im 31/93  soft-tissue]
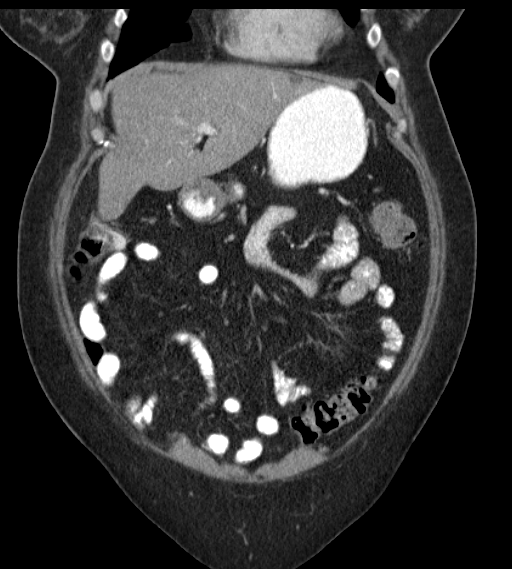
[im 41/93  soft-tissue]
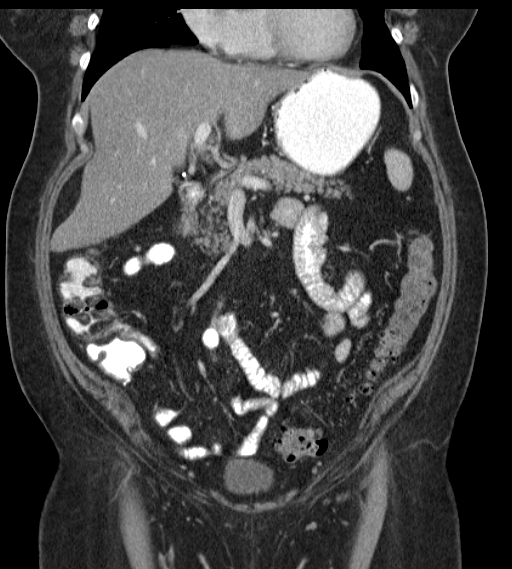
[im 52/93  soft-tissue]
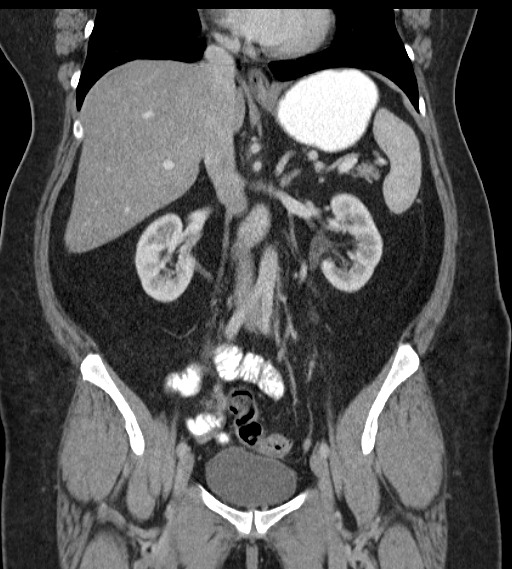

[17 of 46 positions shown; findings below may reference images not displayed]

FINDINGS: Minimal left base pleural thickening otherwise lung bases
clear.

Diffuse fatty infiltration of the liver.  Within the left lobe,
there is a 2.2 cm cyst.

Post cholecystectomy, appendectomy and hysterectomy.

Prominent diverticula most notable sigmoid colon.  No extraluminal
bowel inflammatory process, free fluid or free air.

Ovarian cysts larger on the right measuring up to 3.1 cm.  Small
coarse calcification associated with the right adnexa.

No worrisome splenic, pancreatic, renal or adrenal lesion.

No abdominal aortic aneurysm.

No adenopathy.

No bony destructive lesion.

Noncontrast filled views of the urinary bladder unremarkable.

Mild diastasis rectus muscle without bowel containing hernia.
IMPRESSION: Post cholecystectomy, appendectomy and hysterectomy.

Prominent diverticula most notable sigmoid colon.  No extraluminal
bowel inflammatory process, free fluid or free air.

Ovarian cysts larger on the right measuring up to 3.1 cm.  Small
coarse calcification associated with the right adnexa.

Fatty liver.

## 2013-04-22 MED ORDER — SODIUM CHLORIDE 0.9 % IV SOLN: INTRAVENOUS | Status: DC

## 2013-04-22 MED ORDER — SODIUM CHLORIDE 0.9 % IV BOLUS (SEPSIS)
250.0000 mL | Freq: Once | INTRAVENOUS | Status: AC
  Administered 2013-04-22: 250 mL via INTRAVENOUS

## 2013-04-22 MED ORDER — ONDANSETRON HCL 4 MG/2ML IJ SOLN
4.0000 mg | Freq: Once | INTRAMUSCULAR | Status: AC
  Administered 2013-04-22: 4 mg via INTRAVENOUS
  Filled 2013-04-22: qty 2

## 2013-04-22 MED ORDER — IOHEXOL 300 MG/ML  SOLN
100.0000 mL | Freq: Once | INTRAMUSCULAR | Status: AC | PRN
  Administered 2013-04-22: 100 mL via INTRAVENOUS

## 2013-04-22 MED ORDER — HYDROCODONE-ACETAMINOPHEN 5-325 MG PO TABS: 1.0000 | ORAL_TABLET | Freq: Four times a day (QID) | ORAL | Status: DC | PRN

## 2013-04-22 MED ORDER — TRAMADOL HCL 50 MG PO TABS: 50.0000 mg | ORAL_TABLET | Freq: Four times a day (QID) | ORAL | Status: DC | PRN

## 2013-04-22 MED ORDER — IOHEXOL 300 MG/ML  SOLN
100.0000 mL | Freq: Once | INTRAMUSCULAR | Status: AC | PRN
  Administered 2013-04-22: 50 mL via INTRAVENOUS

## 2013-04-22 MED ORDER — ONDANSETRON 4 MG PO TBDP: 4.0000 mg | ORAL_TABLET | Freq: Three times a day (TID) | ORAL | Status: DC | PRN

## 2013-04-22 NOTE — ED Provider Notes (Signed)
History  This chart was scribed for Shelda Jakes, MD by Bennett Scrape, ED Scribe. This patient was seen in room APA11/[facility]11 and the patient's care was started at 7:14 AM.  CSN: [id number]  Arrival date & time 04/22/13  0253   First MD Initiated Contact with Patient 04/22/13 [phone]     Chief Complaint  Patient presents with  . Abdominal Pain    Patient is a 52 y.o. female presenting with abdominal pain. The history is provided by the patient. No language interpreter was used.  Abdominal Pain This is a new problem. The current episode started more than 1 week ago. The problem occurs constantly (since yesterday). The problem has been gradually worsening. Associated symptoms include abdominal pain. Pertinent negatives include no chest pain, no headaches and no shortness of breath.    HPI Comments: Molly Ryan is a 52 y.o. female who presents to the Emergency Department complaining of 2 weeks of intermittent RLQ abdominal pain described as sharp that became constant yesterday and worse last night. She denies any radiation of the pain and reports associated nausea and questionable fevers that are now resolved. The pain is aggravated by movement, but she denies any alleviating factors. She denies emesis, diarrhea, dysuria and hematuria as associated symptoms.   PCP is Dr. Lilyan Punt  Past Medical History  Diagnosis Date  . Reflux   . Migraine headache   . Chronic insomnia    Past Surgical History  Procedure Laterality Date  . Vaginal hysterectomy  2006  . Appendectomy     Family History  Problem Relation Age of Onset  . Hypertension Brother    History  Substance Use Topics  . Smoking status: Never Smoker   . Smokeless tobacco: Not on file  . Alcohol Use: No   No OB history provided.   Review of Systems  Constitutional: Negative for fever and chills.  HENT: Negative for congestion, sore throat and neck pain.   Eyes: Negative for visual disturbance.   Respiratory: Negative for cough and shortness of breath.   Cardiovascular: Negative for chest pain and leg swelling.  Gastrointestinal: Positive for nausea and abdominal pain. Negative for vomiting and diarrhea.  Genitourinary: Negative for dysuria and hematuria.  Musculoskeletal: Negative for myalgias and back pain.  Skin: Negative for rash.  Neurological: Negative for headaches.  Hematological: Does not bruise/bleed easily.  Psychiatric/Behavioral: Negative for confusion.    Allergies  Morphine and related and Amoxicillin  Home Medications   Current Outpatient Rx  Name  Route  Sig  Dispense  Refill  . aspirin-acetaminophen-caffeine (EXCEDRIN MIGRAINE) 250-250-65 MG per tablet   Oral   Take 2 tablets by mouth every 6 (six) hours as needed for pain.         . citalopram (CELEXA) 20 MG tablet   Oral   Take 20 mg by mouth daily.         . eszopiclone (LUNESTA) 2 MG TABS   Oral   Take 2 mg by mouth at bedtime. Take immediately before bedtime         . rizatriptan (MAXALT) 10 MG tablet   Oral   Take 10 mg by mouth as needed for migraine. May repeat in 2 hours if needed         . HYDROcodone-acetaminophen (NORCO/VICODIN) 5-325 MG per tablet   Oral   Take 1-2 tablets by mouth every 6 (six) hours as needed for pain.   10 tablet   0   . ondansetron (  ZOFRAN ODT) 4 MG disintegrating tablet   Oral   Take 1 tablet (4 mg total) by mouth every 8 (eight) hours as needed.   10 tablet   0   . traMADol (ULTRAM) 50 MG tablet   Oral   Take 1 tablet (50 mg total) by mouth every 6 (six) hours as needed.   20 tablet   0    Triage Vitals: BP 122/77  Pulse 58  Temp(Src) 98.9 F (37.2 C) (Oral)  Resp 18  Ht 5\' 1"  (1.549 m)  Wt 200 lb (90.719 kg)  BMI 37.81 kg/m2  SpO2 100%  Physical Exam  Nursing note and vitals reviewed. Constitutional: She is oriented to person, place, and time. She appears well-developed and well-nourished. No distress.  HENT:  Head:  Normocephalic and atraumatic.  Mouth/Throat: Oropharynx is clear and moist.  Eyes: Conjunctivae and EOM are normal. Pupils are equal, round, and reactive to light.  Sclera are clear  Neck: Neck supple. No tracheal deviation present.  Cardiovascular: Normal rate and regular rhythm.   No murmur heard. Pulses:      Dorsalis pedis pulses are 2+ on the right side, and 2+ on the left side.  Pulmonary/Chest: Effort normal and breath sounds normal. No respiratory distress. She has no wheezes.  Abdominal: Soft. Bowel sounds are normal. She exhibits no distension. There is tenderness (to the RLQ).  Musculoskeletal: Normal range of motion. She exhibits no edema (no ankle swelling).  Lymphadenopathy:    She has no cervical adenopathy.  Neurological: She is alert and oriented to person, place, and time. No cranial nerve deficit.  Pt able to move both sets of fingers and toes  Skin: Skin is warm and dry. No rash noted.  Psychiatric: She has a normal mood and affect. Her behavior is normal.    ED Course  Procedures (including critical care time)  DIAGNOSTIC STUDIES: Oxygen Saturation is 100% on room air, normal by my interpretation.    COORDINATION OF CARE: 7:26 AM-Discussed treatment plan which includes CT of abdomen, medications, CBC panel, CMP and UA with pt at bedside and pt agreed to plan.   9:40 AM-Pt rechecked and informed of computer related pain medication issue. Pt declines pain medication stating that her pain is mild currently.   Labs Reviewed  URINALYSIS, ROUTINE W REFLEX MICROSCOPIC - Abnormal; Notable for the following:    Specific Gravity, Urine >1.030 (*)    All other components within normal limits  COMPREHENSIVE METABOLIC PANEL - Abnormal; Notable for the following:    Glucose, Bld 117 (*)    All other components within normal limits  CBC WITH DIFFERENTIAL   Ct Abdomen Pelvis W Contrast  04/22/2013   *RADIOLOGY REPORT*  Clinical Data: Right lower quadrant pain for 2  weeks.  History cholecystectomy, appendectomy and hysterectomy.  CT ABDOMEN AND PELVIS WITH CONTRAST  Technique:  Multidetector CT imaging of the abdomen and pelvis was performed following the standard protocol during bolus administration of intravenous contrast.  Contrast: 50mL OMNIPAQUE IOHEXOL 300 MG/ML  SOLN, OMNIPAQUE IOHEXOL 300 MG/ML  SOLN  Comparison: No comparison CT.  09/29/2006 ultrasound.  Findings: Minimal left base pleural thickening otherwise lung bases clear.  Diffuse fatty infiltration of the liver.  Within the left lobe, there is a 2.2 cm cyst.  Post cholecystectomy, appendectomy and hysterectomy.  Prominent diverticula most notable sigmoid colon.  No extraluminal bowel inflammatory process, free fluid or free air.  Ovarian cysts larger on the right measuring up to 3.1  cm.  Small coarse calcification associated with the right adnexa.  No worrisome splenic, pancreatic, renal or adrenal lesion.  No abdominal aortic aneurysm.  No adenopathy.  No bony destructive lesion.  Noncontrast filled views of the urinary bladder unremarkable.  Mild diastasis rectus muscle without bowel containing hernia.  IMPRESSION: Post cholecystectomy, appendectomy and hysterectomy.  Prominent diverticula most notable sigmoid colon.  No extraluminal bowel inflammatory process, free fluid or free air.  Ovarian cysts larger on the right measuring up to 3.1 cm.  Small coarse calcification associated with the right adnexa.  Fatty liver.   Original Report Authenticated By: Lacy Duverney, M.D.    Results for orders placed during the hospital encounter of 04/22/13  URINALYSIS, ROUTINE W REFLEX MICROSCOPIC      Result Value Range   Color, Urine YELLOW  YELLOW   APPearance CLEAR  CLEAR   Specific Gravity, Urine >1.030 (*) 1.005 - 1.030   pH 5.5  5.0 - 8.0   Glucose, UA NEGATIVE  NEGATIVE mg/dL   Hgb urine dipstick NEGATIVE  NEGATIVE   Bilirubin Urine NEGATIVE  NEGATIVE   Ketones, ur NEGATIVE  NEGATIVE mg/dL    Protein, ur NEGATIVE  NEGATIVE mg/dL   Urobilinogen, UA 0.2  0.0 - 1.0 mg/dL   Nitrite NEGATIVE  NEGATIVE   Leukocytes, UA NEGATIVE  NEGATIVE  CBC WITH DIFFERENTIAL      Result Value Range   WBC 7.3  4.0 - 10.5 K/uL   RBC 4.57  3.87 - 5.11 MIL/uL   Hemoglobin 13.4  12.0 - 15.0 g/dL   HCT 78.2  95.6 - 21.3 %   MCV 86.2  78.0 - 100.0 fL   MCH 29.3  26.0 - 34.0 pg   MCHC 34.0  30.0 - 36.0 g/dL   RDW 08.6  57.8 - 46.9 %   Platelets 310  150 - 400 K/uL   Neutrophils Relative % 60  43 - 77 %   Neutro Abs 4.4  1.7 - 7.7 K/uL   Lymphocytes Relative 32  12 - 46 %   Lymphs Abs 2.3  0.7 - 4.0 K/uL   Monocytes Relative 6  3 - 12 %   Monocytes Absolute 0.4  0.1 - 1.0 K/uL   Eosinophils Relative 2  0 - 5 %   Eosinophils Absolute 0.1  0.0 - 0.7 K/uL   Basophils Relative 0  0 - 1 %   Basophils Absolute 0.0  0.0 - 0.1 K/uL  COMPREHENSIVE METABOLIC PANEL      Result Value Range   Sodium 138  135 - 145 mEq/L   Potassium 3.7  3.5 - 5.1 mEq/L   Chloride 104  96 - 112 mEq/L   CO2 28  19 - 32 mEq/L   Glucose, Bld 117 (*) 70 - 99 mg/dL   BUN 15  6 - 23 mg/dL   Creatinine, Ser 6.29  0.50 - 1.10 mg/dL   Calcium 9.2  8.4 - 52.8 mg/dL   Total Protein 7.0  6.0 - 8.3 g/dL   Albumin 3.8  3.5 - 5.2 g/dL   AST 16  0 - 37 U/L   ALT 26  0 - 35 U/L   Alkaline Phosphatase 59  39 - 117 U/L   Total Bilirubin 0.5  0.3 - 1.2 mg/dL   GFR calc non Af Amer >90  >90 mL/min   GFR calc Af Amer >90  >90 mL/min     1. Ovarian cyst     MDM  The patient with bilateral ovarian cysts right greater than left symptoms are predominantly on the right. Treat with pain medication patient has followup with primary care Dr. also followup on Tuesday with [facility] here locally they can follow the cyst and make sure that it resolves it is just physiologic and not anything of many more concerned.  I personally performed the services described in this documentation, which was scribed in my presence. The recorded  information has been reviewed and is accurate.     Shelda Jakes, MD 04/22/13 [phone]

## 2013-04-22 NOTE — ED Notes (Signed)
Pain to right lower abd and at times shooting pains.

## 2013-04-27 ENCOUNTER — Encounter: Payer: Self-pay | Admitting: Obstetrics & Gynecology

## 2013-04-27 ENCOUNTER — Ambulatory Visit (INDEPENDENT_AMBULATORY_CARE_PROVIDER_SITE_OTHER): Payer: Managed Care, Other (non HMO) | Admitting: Obstetrics & Gynecology

## 2013-04-27 VITALS — BP 132/90 | Ht 61.0 in | Wt 198.0 lb

## 2013-04-27 DIAGNOSIS — R1031 Right lower quadrant pain: Secondary | ICD-10-CM

## 2013-04-27 DIAGNOSIS — N83209 Unspecified ovarian cyst, unspecified side: Secondary | ICD-10-CM

## 2013-04-27 NOTE — Progress Notes (Signed)
Patient ID: Molly Ryan, female   DOB: 07-Jun-1961, 52 y.o.   MRN: [id number] Mikisha is an for followup from the emergency room She is having a right lower quadrant pain over the past couple weeks and it got significantly worse on the 10th Ultimately she had a CT scan which shows a 3.1 cm right ovarian cyst appears to be simple but of course limited because of CT There is no free fluid or any other intraperitoneal abnormalities She's had her appendix out She's had a hysterectomy  I evaluated the labs in the CT scan we did do a CA 125 and a sonogram in the office for evaluation Then regroup and decide what to do She is also having pain with intercourse which makes me think this is probably adherent right ovary That is becoming increasingly symptomatic at this point She denies having any other episodes similar to this in the last 8 years or since her hysterectomy which I did  CA 125 was drawn the day See her back next week for an ultrasound and evaluation of both of those pieces of data Probably proceed with laparoscopic removal of ovaries and lysis of adhesions

## 2013-04-27 NOTE — Patient Instructions (Addendum)
Ovarian Cyst  The ovaries are small organs that are on each side of the uterus. The ovaries are the organs that produce the female hormones, estrogen and progesterone. An ovarian cyst is a sac filled with fluid that can vary in its size. It is normal for a small cyst to form in women who are in the childbearing age and who have menstrual periods. This type of cyst is called a follicle cyst that becomes an ovulation cyst (corpus luteum cyst) after it produces the women's egg. It later goes away on its own if the woman does not become pregnant. There are other kinds of ovarian cysts that may cause problems and may need to be treated. The most serious problem is a cyst with cancer. It should be noted that menopausal women who have an ovarian cyst are at a higher risk of it being a cancer cyst. They should be evaluated very quickly, thoroughly and followed closely. This is especially true in menopausal women because of the high rate of ovarian cancer in women in menopause.  CAUSES AND TYPES OF OVARIAN CYSTS:   FUNCTIONAL CYST: The follicle/corpus luteum cyst is a functional cyst that occurs every month during ovulation with the menstrual cycle. They go away with the next menstrual cycle if the woman does not get pregnant. Usually, there are no symptoms with a functional cyst.   ENDOMETRIOMA CYST: This cyst develops from the lining of the uterus tissue. This cyst gets in or on the ovary. It grows every month from the bleeding during the menstrual period. It is also called a "chocolate cyst" because it becomes filled with blood that turns brown. This cyst can cause pain in the lower abdomen during intercourse and with your menstrual period.   CYSTADENOMA CYST: This cyst develops from the cells on the outside of the ovary. They usually are not cancerous. They can get very big and cause lower abdomen pain and pain with intercourse. This type of cyst can twist on itself, cut off its blood supply and cause severe pain. It  also can easily rupture and cause a lot of pain.   DERMOID CYST: This type of cyst is sometimes found in both ovaries. They are found to have different kinds of body tissue in the cyst. The tissue includes skin, teeth, hair, and/or cartilage. They usually do not have symptoms unless they get very big. Dermoid cysts are rarely cancerous.   POLYCYSTIC OVARY: This is a rare condition with hormone problems that produces many small cysts on both ovaries. The cysts are follicle-like cysts that never produce an egg and become a corpus luteum. It can cause an increase in body weight, infertility, acne, increase in body and facial hair and lack of menstrual periods or rare menstrual periods. Many women with this problem develop type 2 diabetes. The exact cause of this problem is unknown. A polycystic ovary is rarely cancerous.   THECA LUTEIN CYST: Occurs when too much hormone (human chorionic gonadotropin) is produced and over-stimulates the ovaries to produce an egg. They are frequently seen when doctors stimulate the ovaries for invitro-fertilization (test tube babies).   LUTEOMA CYST: This cyst is seen during pregnancy. Rarely it can cause an obstruction to the birth canal during labor and delivery. They usually go away after delivery.  SYMPTOMS    Pelvic pain or pressure.   Pain during sexual intercourse.   Increasing girth (swelling) of the abdomen.   Abnormal menstrual periods.   Increasing pain with menstrual periods.     You stop having menstrual periods and you are not pregnant.  DIAGNOSIS   The diagnosis can be made during:   Routine or annual pelvic examination (common).   Ultrasound.   X-ray of the pelvis.   CT Scan.   MRI.   Blood tests.  TREATMENT    Treatment may only be to follow the cyst monthly for 2 to 3 months with your caregiver. Many go away on their own, especially functional cysts.   May be aspirated (drained) with a long needle with ultrasound, or by laparoscopy (inserting a tube into  the pelvis through a small incision).   The whole cyst can be removed by laparoscopy.   Sometimes the cyst may need to be removed through an incision in the lower abdomen.   Hormone treatment is sometimes used to help dissolve certain cysts.   Birth control pills are sometimes used to help dissolve certain cysts.  HOME CARE INSTRUCTIONS   Follow your caregiver's advice regarding:   Medicine.   Follow up visits to evaluate and treat the cyst.   You may need to come back or make an appointment with another caregiver, to find the exact cause of your cyst, if your caregiver is not a gynecologist.   Get your yearly and recommended pelvic examinations and Pap tests.   Let your caregiver know if you have had an ovarian cyst in the past.  SEEK MEDICAL CARE IF:    Your periods are late, irregular, they stop, or are painful.   Your stomach (abdomen) or pelvic pain does not go away.   Your stomach becomes larger or swollen.   You have pressure on your bladder or trouble emptying your bladder completely.   You have painful sexual intercourse.   You have feelings of fullness, pressure, or discomfort in your stomach.   You lose weight for no apparent reason.   You feel generally ill.   You become constipated.   You lose your appetite.   You develop acne.   You have an increase in body and facial hair.   You are gaining weight, without changing your exercise and eating habits.   You think you are pregnant.  SEEK IMMEDIATE MEDICAL CARE IF:    You have increasing abdominal pain.   You feel sick to your stomach (nausea) and/or vomit.   You develop a fever that comes on suddenly.   You develop abdominal pain during a bowel movement.   Your menstrual periods become heavier than usual.  Document Released: 09/30/2005 Document Revised: 12/23/2011 Document Reviewed: 08/03/2009  [employer] Patient Information 2014 [employer].

## 2013-04-30 ENCOUNTER — Ambulatory Visit (INDEPENDENT_AMBULATORY_CARE_PROVIDER_SITE_OTHER): Payer: Managed Care, Other (non HMO) | Admitting: Obstetrics & Gynecology

## 2013-04-30 ENCOUNTER — Ambulatory Visit (INDEPENDENT_AMBULATORY_CARE_PROVIDER_SITE_OTHER): Payer: Managed Care, Other (non HMO)

## 2013-04-30 ENCOUNTER — Encounter: Payer: Self-pay | Admitting: Obstetrics & Gynecology

## 2013-04-30 VITALS — BP 120/80 | Wt 198.0 lb

## 2013-04-30 DIAGNOSIS — R1031 Right lower quadrant pain: Secondary | ICD-10-CM

## 2013-04-30 DIAGNOSIS — N83209 Unspecified ovarian cyst, unspecified side: Secondary | ICD-10-CM

## 2013-04-30 NOTE — Patient Instructions (Signed)
Laparoscopic Ovarian Torsion Surgery °The ovaries are female reproductive organs that produce eggs. Ovarian torsion is when an ovary becomes twisted and cuts off its own blood supply. If the ovary becomes twisted, it cannot get blood and the ovary swells. This swelling can cause extreme pelvic pain that can come and go. Laparoscopic ovarian torsion surgery is needed to untwist the ovary and restore blood flow to the ovary.  °LET YOUR CAREGIVER KNOW ABOUT:  °· Allergies to food or medicine. °· Medicines taken, including vitamins, herbs, eyedrops, over-the-counter medicines, and creams. °· Use of steroids (by mouth or creams). °· Previous problems with anesthetics or numbing medicines. °· History of bleeding problems or blood clots. °· Previous surgery. °· Other health problems, including hypertension, diabetes, and kidney problems. °· Possibility of pregnancy. °RISKS AND COMPLICATIONS °· Allergies to medicines. °· Difficulty breathing. °· Bleeding. °· Infection. °· Damage to other structures near the ovaries. °BEFORE THE PROCEDURE °· If possible, ask your caregiver about changing or stopping your regular medicines. °· If possible, do not eat or drink anything for at least 8 hours before your surgery. °· If possible, stop smoking (if you smoke). Stopping will improve your health after surgery. °· Arrange for a ride home after surgery and for help at home during recovery. °PROCEDURE °· An intravenous (IV) access tube will be put into one of your vein in order to give you fluids and medicines. °· You will receive medicines to relax you and medicines that make you sleep (general anesthetic). °· You may have a flexible tube (catheter) put into your bladder to drain urine. °· You may have a tube put through your nose or mouth into your stomach (nasogastric tube). The nasogastric tube removes digestive fluids and prevents you from feeling sick to your stomach (nauseous) and throwing up (vomiting). °· Three to four small  incisions (laparoscopically) or an open incision will be made in the abdomen. Your surgeon will decide the appropriate approach. °· The ovary will be untwisted with small instruments inserted through the laparoscopic incisions. °· The ovary is then observed to see if blood flow returns. If blood flow cannot be restored to the ovary, it may have to be surgically removed. °AFTER THE PROCEDURE  °· Plan to be in the hospital for 1 day or less. °· You may have abdominal cramping and a sore throat. Your pain will be controlled with medicine. °· You may have a liquid diet temporarily. You will most likely return to, and tolerate, your usual diet the day after surgery. °· You will be passing urine through a catheter. The catheter will be removed after surgery. °· Your temperature, breathing rate, heart rate, blood pressure, and oxygen level will be monitored regularly. °· You will still wear compression stockings on your legs until you are able to move around. °· You will use a device or do breathing exercises to keep your lungs clear. °· You will be encouraged to walk as soon as possible. °· Expect a full recovery in 4 to 6 weeks after surgery. °Document Released: 12/23/2011 Document Reviewed: 12/23/2011 °[employer]® Patient Information ©2014 [employer]. ° °

## 2013-04-30 NOTE — Progress Notes (Signed)
Patient ID: Molly Ryan, female   DOB: March 18, 1961, 52 y.o.   MRN: [id number] Patient continued to have from the bothersome is right lower quadrant pain Sonogram today reveals a 2-1/2 cm right ovarian cyst which is at all thank is probably the source of her pain placed and the way it hurting am thinking is due to adhesions of the right adnexa may BE to the bowel We discussed conservative management versus laparoscopic removal and Molly Ryan wants to proceed with a laparoscopic removal of both ovaries That is planned for August 13 We will be as scheduled in call her and let her know the preoperative dates for that

## 2013-05-01 LAB — CA 125: CA 125: 11.1 U/mL (ref 0.0–30.2)

## 2013-05-06 ENCOUNTER — Telehealth: Payer: Self-pay

## 2013-05-06 NOTE — Telephone Encounter (Signed)
The patient called and stated that she would have to cancel her surgery scheduled on 05/26/13 due to out of network benefits, she works for [employer].  She will let us know who she chooses as an Therapist, sports provider so we can provide them with her medical records.  I called Clydie Braun, the surgery scheduler at [facility] and cancelled surgery and preoperative appointment.

## 2013-05-21 ENCOUNTER — Other Ambulatory Visit (HOSPITAL_COMMUNITY): Payer: Managed Care, Other (non HMO)

## 2013-05-26 ENCOUNTER — Ambulatory Visit: Admit: 2013-05-26 | Payer: Managed Care, Other (non HMO) | Admitting: Obstetrics & Gynecology

## 2013-05-26 SURGERY — SALPINGO-OOPHORECTOMY, LAPAROSCOPIC
Anesthesia: General | Site: Vagina | Laterality: Bilateral

## 2013-06-02 ENCOUNTER — Encounter: Payer: Managed Care, Other (non HMO) | Admitting: Obstetrics & Gynecology

## 2013-07-12 ENCOUNTER — Encounter: Payer: Self-pay | Admitting: Nurse Practitioner

## 2013-07-12 ENCOUNTER — Ambulatory Visit (INDEPENDENT_AMBULATORY_CARE_PROVIDER_SITE_OTHER): Payer: Managed Care, Other (non HMO) | Admitting: Nurse Practitioner

## 2013-07-12 ENCOUNTER — Encounter: Payer: Self-pay | Admitting: Family Medicine

## 2013-07-12 VITALS — BP 124/84 | Ht 61.0 in | Wt 198.8 lb

## 2013-07-12 DIAGNOSIS — G43909 Migraine, unspecified, not intractable, without status migrainosus: Secondary | ICD-10-CM

## 2013-07-12 DIAGNOSIS — G4700 Insomnia, unspecified: Secondary | ICD-10-CM

## 2013-07-12 DIAGNOSIS — F5104 Psychophysiologic insomnia: Secondary | ICD-10-CM

## 2013-07-12 DIAGNOSIS — F341 Dysthymic disorder: Secondary | ICD-10-CM

## 2013-07-12 DIAGNOSIS — G43109 Migraine with aura, not intractable, without status migrainosus: Secondary | ICD-10-CM

## 2013-07-12 DIAGNOSIS — F418 Other specified anxiety disorders: Secondary | ICD-10-CM

## 2013-07-12 MED ORDER — ESZOPICLONE 2 MG PO TABS: 2.0000 mg | ORAL_TABLET | Freq: Every day | ORAL | Status: DC

## 2013-07-12 MED ORDER — RIZATRIPTAN BENZOATE 10 MG PO TBDP: 10.0000 mg | ORAL_TABLET | ORAL | Status: DC | PRN

## 2013-07-12 MED ORDER — CITALOPRAM HYDROBROMIDE 20 MG PO TABS: 20.0000 mg | ORAL_TABLET | Freq: Every day | ORAL | Status: DC

## 2013-07-12 NOTE — Patient Instructions (Signed)
Neurontin 300 mg at bedtime

## 2013-07-15 ENCOUNTER — Encounter: Payer: Self-pay | Admitting: Nurse Practitioner

## 2013-07-15 DIAGNOSIS — G43109 Migraine with aura, not intractable, without status migrainosus: Secondary | ICD-10-CM | POA: Insufficient documentation

## 2013-07-15 DIAGNOSIS — F5104 Psychophysiologic insomnia: Secondary | ICD-10-CM | POA: Insufficient documentation

## 2013-07-15 DIAGNOSIS — F418 Other specified anxiety disorders: Secondary | ICD-10-CM | POA: Insufficient documentation

## 2013-07-15 NOTE — Assessment & Plan Note (Signed)
.   eszopiclone (LUNESTA) 2 MG TABS tablet    Sig: Take 1 tablet (2 mg total) by mouth at bedtime. Take immediately before bedtime    Dispense:  30 tablet    Refill:  5    Dispense name brand only    Order Specific Question:  Supervising Provider    Answer:  LUKING, WILLIAM S [[id number]]  . rizatriptan (MAXALT-MLT) 10 MG disintegrating tablet    Sig: Take 1 tablet (10 mg total) by mouth as needed for migraine. May repeat in 2 hours if needed; max 2 per 24 hrs.    Dispense:  10 tablet    Refill:  2    Order Specific Question:  Supervising Provider    Answer:  LUKING, WILLIAM S [[id number]]  . citalopram (CELEXA) 20 MG tablet    Sig: Take 1 tablet (20 mg total) by mouth daily.    Dispense:  30 tablet    Refill:  5    Order Specific Question:  Supervising Provider    Answer:  LUKING, WILLIAM S [[id number]]   Recheck in 6 months, plan routine lab work at that time. 

## 2013-07-15 NOTE — Assessment & Plan Note (Signed)
.   eszopiclone (LUNESTA) 2 MG TABS tablet    Sig: Take 1 tablet (2 mg total) by mouth at bedtime. Take immediately before bedtime    Dispense:  30 tablet    Refill:  5    Dispense name brand only    Order Specific Question:  Supervising Provider    Answer:  Merlyn Albert [[id number]]  . rizatriptan (MAXALT-MLT) 10 MG disintegrating tablet    Sig: Take 1 tablet (10 mg total) by mouth as needed for migraine. May repeat in 2 hours if needed; max 2 per 24 hrs.    Dispense:  10 tablet    Refill:  2    Order Specific Question:  Supervising Provider    Answer:  Merlyn Albert [[id number]]  . citalopram (CELEXA) 20 MG tablet    Sig: Take 1 tablet (20 mg total) by mouth daily.    Dispense:  30 tablet    Refill:  5    Order Specific Question:  Supervising Provider    Answer:  Merlyn Albert [[id number]]   Recheck in 6 months, plan routine lab work at that time.

## 2013-07-15 NOTE — Progress Notes (Signed)
Subjective:  Presents for routine followup. Celexa 20 mg is doing well. Patient recently had bilateral oophorectomy. Is working with her gynecologist on her hormones. Has been having lots of hot flashes. Taking Lunesta for sleep. This continues to work well. Occasional migraine, had one last week usually Maxalt works well. Takes occasional BC powder. No change in her migraine symptomatology. Gets flu vaccine at work.  Objective:   BP 124/84  Ht 5\' 1"  (1.549 m)  Wt 198 lb 12.8 oz (90.175 kg)  BMI 37.58 kg/m2 NAD. Alert, oriented. Lungs clear. Heart regular rate rhythm.  Assessment:Migraine headache with aura  Chronic insomnia  Depression with anxiety  Plan:  Meds ordered this encounter  Medications  . eszopiclone (LUNESTA) 2 MG TABS tablet    Sig: Take 1 tablet (2 mg total) by mouth at bedtime. Take immediately before bedtime    Dispense:  30 tablet    Refill:  5    Dispense name brand only    Order Specific Question:  Supervising Provider    Answer:  Merlyn Albert [[id number]]  . rizatriptan (MAXALT-MLT) 10 MG disintegrating tablet    Sig: Take 1 tablet (10 mg total) by mouth as needed for migraine. May repeat in 2 hours if needed; max 2 per 24 hrs.    Dispense:  10 tablet    Refill:  2    Order Specific Question:  Supervising Provider    Answer:  Merlyn Albert [[id number]]  . citalopram (CELEXA) 20 MG tablet    Sig: Take 1 tablet (20 mg total) by mouth daily.    Dispense:  30 tablet    Refill:  5    Order Specific Question:  Supervising Provider    Answer:  Merlyn Albert [[id number]]   Recheck in 6 months, plan routine lab work at that time. Recommend that she discuss with her gynecologist use of Neurontin 300 mg at bedtime for her hot flashes.

## 2014-01-18 ENCOUNTER — Telehealth: Payer: Self-pay | Admitting: *Deleted

## 2014-01-18 ENCOUNTER — Encounter: Payer: Self-pay | Admitting: Family Medicine

## 2014-01-18 ENCOUNTER — Ambulatory Visit (INDEPENDENT_AMBULATORY_CARE_PROVIDER_SITE_OTHER): Payer: Managed Care, Other (non HMO) | Admitting: Family Medicine

## 2014-01-18 VITALS — BP 142/94 | Ht 61.0 in | Wt 205.0 lb

## 2014-01-18 DIAGNOSIS — F5104 Psychophysiologic insomnia: Secondary | ICD-10-CM

## 2014-01-18 DIAGNOSIS — Z13220 Encounter for screening for lipoid disorders: Secondary | ICD-10-CM

## 2014-01-18 DIAGNOSIS — F418 Other specified anxiety disorders: Secondary | ICD-10-CM

## 2014-01-18 DIAGNOSIS — F341 Dysthymic disorder: Secondary | ICD-10-CM

## 2014-01-18 DIAGNOSIS — G4700 Insomnia, unspecified: Secondary | ICD-10-CM

## 2014-01-18 DIAGNOSIS — Z131 Encounter for screening for diabetes mellitus: Secondary | ICD-10-CM

## 2014-01-18 DIAGNOSIS — R5381 Other malaise: Secondary | ICD-10-CM

## 2014-01-18 DIAGNOSIS — R5383 Other fatigue: Secondary | ICD-10-CM

## 2014-01-18 MED ORDER — CITALOPRAM HYDROBROMIDE 20 MG PO TABS: 20.0000 mg | ORAL_TABLET | Freq: Every day | ORAL | Status: DC

## 2014-01-18 MED ORDER — ESZOPICLONE 2 MG PO TABS: 2.0000 mg | ORAL_TABLET | Freq: Every day | ORAL | Status: DC

## 2014-01-18 NOTE — Telephone Encounter (Signed)
Patient forgot to mention to you in her visit that she would also like her TSH checked b/c she works in the [employer] as a [employer] certified [employer]. Can we please add that to the existing BW that was ordered?

## 2014-01-18 NOTE — Telephone Encounter (Signed)
May add TSH with diagnosis of fatigue

## 2014-01-18 NOTE — Progress Notes (Signed)
   Subjective:    Patient ID: Molly Ryan, female    DOB: 05-Dec-1960, 53 y.o.   MRN: [id number]  HPI Patient is here today to get a refill on her meds.  She needs a refill on Lunesta and Celexa. She denies being depressed. She does have chronic insomnia Lunesta as the only medication it seems to help her. She denies abusing it. She states the Celexa helps her with her moods. She also states she has off and on chronic neck pain that causes the head to hurt the back of her head she uses Excedrin Migraine for it. I talked with her extensively about limiting Excedrin Migraine to no more than once or twice a week.  No other concerns.  Patient states that she doesn't get a chance exercise much. She also states that she accidentally stabbed herself with a clean scalpel at work she will be getting a tetanus shot bear. The cut has healed.   Review of Systems  Constitutional: Negative for activity change, appetite change and fatigue.  Respiratory: Negative for cough.   Cardiovascular: Negative for chest pain.  Gastrointestinal: Negative for abdominal pain.  Endocrine: Negative for polydipsia and polyphagia.  Genitourinary: Negative for frequency.  Neurological: Negative for weakness.  Psychiatric/Behavioral: Negative for confusion.       Objective:   Physical Exam  Vitals reviewed. Constitutional: She appears well-nourished. No distress.  Cardiovascular: Normal rate, regular rhythm and normal heart sounds.   No murmur heard. Pulmonary/Chest: Effort normal and breath sounds normal. No respiratory distress.  Musculoskeletal: She exhibits no edema.  Lymphadenopathy:    She has no cervical adenopathy.  Neurological: She is alert. She exhibits normal muscle tone.  Psychiatric: Her behavior is normal.          Assessment & Plan:  Chronic neck pain-try to limit Excedrin Migraine to once or twice per week if possible Stress/history of depression-continue Celexa does well on  that. Chronic insomnia Lunesta does well with this. Continue it. Lipid and glucose for screening purposes today Information sheet on colonoscopy given.

## 2014-01-22 LAB — GLUCOSE, RANDOM: Glucose, Bld: 156 mg/dL — ABNORMAL HIGH (ref 70–99)

## 2014-01-22 LAB — TSH: TSH: 1.073 u[IU]/mL (ref 0.350–4.500)

## 2014-01-22 LAB — LIPID PANEL
Cholesterol: 163 mg/dL (ref 0–200)
HDL: 57 mg/dL (ref >39)
LDL Cholesterol: 83 mg/dL (ref 0–99)
Total CHOL/HDL Ratio: 2.9 Ratio
Triglycerides: 115 mg/dL (ref <150)
VLDL: 23 mg/dL (ref 0–40)

## 2014-03-21 ENCOUNTER — Ambulatory Visit: Payer: Managed Care, Other (non HMO) | Admitting: Family Medicine

## 2014-07-13 ENCOUNTER — Encounter: Payer: Self-pay | Admitting: Nurse Practitioner

## 2014-07-13 ENCOUNTER — Ambulatory Visit (INDEPENDENT_AMBULATORY_CARE_PROVIDER_SITE_OTHER): Payer: Managed Care, Other (non HMO) | Admitting: Nurse Practitioner

## 2014-07-13 VITALS — BP 136/92 | Ht 61.0 in | Wt 199.0 lb

## 2014-07-13 DIAGNOSIS — F5104 Psychophysiologic insomnia: Secondary | ICD-10-CM

## 2014-07-13 DIAGNOSIS — F341 Dysthymic disorder: Secondary | ICD-10-CM

## 2014-07-13 DIAGNOSIS — G4700 Insomnia, unspecified: Secondary | ICD-10-CM

## 2014-07-13 DIAGNOSIS — R7309 Other abnormal glucose: Secondary | ICD-10-CM

## 2014-07-13 DIAGNOSIS — R7303 Prediabetes: Secondary | ICD-10-CM

## 2014-07-13 DIAGNOSIS — F418 Other specified anxiety disorders: Secondary | ICD-10-CM

## 2014-07-13 LAB — POCT GLYCOSYLATED HEMOGLOBIN (HGB A1C): Hemoglobin A1C: 6.3

## 2014-07-13 MED ORDER — ESZOPICLONE 2 MG PO TABS: 2.0000 mg | ORAL_TABLET | Freq: Every day | ORAL | Status: DC

## 2014-07-13 MED ORDER — CITALOPRAM HYDROBROMIDE 20 MG PO TABS: 20.0000 mg | ORAL_TABLET | Freq: Every day | ORAL | Status: DC

## 2014-07-14 ENCOUNTER — Encounter: Payer: Self-pay | Admitting: Nurse Practitioner

## 2014-07-14 NOTE — Progress Notes (Signed)
Subjective:  Presents for recheck. Just completed a course of prednisone which she thinks raised her blood sugar. No regular exercise. Has cut out regular sodas and sweetened tea from her diet. Gets regular preventive health physicals with her gynecologist. Gets flu vaccine at work. No chest pain/ischemic type pain shortness of breath or edema. Continues to have significant stress mainly due to work. Lunesta working well for sleep.  Objective:   BP 136/92  Ht 5\' 1"  (1.549 m)  Wt 199 lb (90.266 kg)  BMI 37.62 kg/m2 NAD. Alert, oriented. Cheerful affect. Lungs clear. Heart regular rate rhythm. Lower extremities no edema. Significant central obesity. Results for orders placed in visit on 07/13/14  POCT GLYCOSYLATED HEMOGLOBIN (HGB A1C)      Result Value Ref Range   Hemoglobin A1C 6.3       Assessment:  Problem List Items Addressed This Visit     Endocrine   Pre-diabetes - Primary   Relevant Orders      POCT glycosylated hemoglobin (Hb A1C) (Completed)     Other   Chronic insomnia   Depression with anxiety       Plan:  Meds ordered this encounter  Medications  . eszopiclone (LUNESTA) 2 MG TABS tablet    Sig: Take 1 tablet (2 mg total) by mouth at bedtime. Take immediately before bedtime    Dispense:  30 tablet    Refill:  5    Order Specific Question:  Supervising Provider    Answer:  Mikey Kirschner [[id number]]  . citalopram (CELEXA) 20 MG tablet    Sig: Take 1 tablet (20 mg total) by mouth daily.    Dispense:  30 tablet    Refill:  5    Order Specific Question:  Supervising Provider    Answer:  Mikey Kirschner [[id number]]  . Cyanocobalamin (VITAMIN B-12 CR) 1000 MCG TBCR    Sig: Take 1,000 mcg by mouth.   Hemoglobin A1c may have been slightly elevated due to recent prednisone. Encouraged regular activity healthy diet and weight loss particularly around the abdominal area. Patient defers medications at this point. Recheck hemoglobin A1c at her next visit. Call back sooner if  any problems. Return in about 6 months (around 01/11/2015).

## 2015-01-16 ENCOUNTER — Other Ambulatory Visit: Payer: Self-pay | Admitting: Nurse Practitioner

## 2015-02-23 ENCOUNTER — Ambulatory Visit (INDEPENDENT_AMBULATORY_CARE_PROVIDER_SITE_OTHER): Payer: BLUE CROSS/BLUE SHIELD | Admitting: Family Medicine

## 2015-02-23 ENCOUNTER — Encounter: Payer: Self-pay | Admitting: Family Medicine

## 2015-02-23 VITALS — BP 128/88 | Ht 61.0 in | Wt 204.0 lb

## 2015-02-23 DIAGNOSIS — G43009 Migraine without aura, not intractable, without status migrainosus: Secondary | ICD-10-CM

## 2015-02-23 DIAGNOSIS — R7303 Prediabetes: Secondary | ICD-10-CM

## 2015-02-23 DIAGNOSIS — G4700 Insomnia, unspecified: Secondary | ICD-10-CM

## 2015-02-23 DIAGNOSIS — E119 Type 2 diabetes mellitus without complications: Secondary | ICD-10-CM

## 2015-02-23 DIAGNOSIS — F5104 Psychophysiologic insomnia: Secondary | ICD-10-CM

## 2015-02-23 DIAGNOSIS — R7309 Other abnormal glucose: Secondary | ICD-10-CM

## 2015-02-23 DIAGNOSIS — Z13220 Encounter for screening for lipoid disorders: Secondary | ICD-10-CM

## 2015-02-23 DIAGNOSIS — Z79899 Other long term (current) drug therapy: Secondary | ICD-10-CM | POA: Diagnosis not present

## 2015-02-23 LAB — POCT GLYCOSYLATED HEMOGLOBIN (HGB A1C): Hemoglobin A1C: 7.6

## 2015-02-23 MED ORDER — CITALOPRAM HYDROBROMIDE 20 MG PO TABS
20.0000 mg | ORAL_TABLET | Freq: Every day | ORAL | Status: DC
Start: 2015-02-23 — End: 2015-09-11

## 2015-02-23 MED ORDER — METFORMIN HCL 500 MG PO TABS: ORAL_TABLET | ORAL | Status: DC

## 2015-02-23 MED ORDER — ESZOPICLONE 2 MG PO TABS: 2.0000 mg | ORAL_TABLET | Freq: Every day | ORAL | Status: DC

## 2015-02-23 NOTE — Patient Instructions (Addendum)
Diabetes Mellitus and Food It is important for you to manage your blood sugar (glucose) level. Your blood glucose level can be greatly affected by what you eat. Eating healthier foods in the appropriate amounts throughout the day at about the same time each day will help you control your blood glucose level. It can also help slow or prevent worsening of your diabetes mellitus. Healthy eating may even help you improve the level of your blood pressure and reach or maintain a healthy weight.  HOW CAN FOOD AFFECT ME? Carbohydrates Carbohydrates affect your blood glucose level more than any other type of food. Your dietitian will help you determine how many carbohydrates to eat at each meal and teach you how to count carbohydrates. Counting carbohydrates is important to keep your blood glucose at a healthy level, especially if you are using insulin or taking certain medicines for diabetes mellitus. Alcohol Alcohol can cause sudden decreases in blood glucose (hypoglycemia), especially if you use insulin or take certain medicines for diabetes mellitus. Hypoglycemia can be a life-threatening condition. Symptoms of hypoglycemia (sleepiness, dizziness, and disorientation) are similar to symptoms of having too much alcohol.  If your health care provider has given you approval to drink alcohol, do so in moderation and use the following guidelines:  Women should not have more than one drink per day, and men should not have more than two drinks per day. One drink is equal to:  12 oz of beer.  5 oz of wine.  1 oz of hard liquor.  Do not drink on an empty stomach.  Keep yourself hydrated. Have water, diet soda, or unsweetened iced tea.  Regular soda, juice, and other mixers might contain a lot of carbohydrates and should be counted. WHAT FOODS ARE NOT RECOMMENDED? As you make food choices, it is important to remember that all foods are not the same. Some foods have fewer nutrients per serving than other  foods, even though they might have the same number of calories or carbohydrates. It is difficult to get your body what it needs when you eat foods with fewer nutrients. Examples of foods that you should avoid that are high in calories and carbohydrates but low in nutrients include:  Trans fats (most processed foods list trans fats on the Nutrition Facts label).  Regular soda.  Juice.  Candy.  Sweets, such as cake, pie, doughnuts, and cookies.  Fried foods. WHAT FOODS CAN I EAT? Have nutrient-rich foods, which will nourish your body and keep you healthy. The food you should eat also will depend on several factors, including:  The calories you need.  The medicines you take.  Your weight.  Your blood glucose level.  Your blood pressure level.  Your cholesterol level. You also should eat a variety of foods, including:  Protein, such as meat, poultry, fish, tofu, nuts, and seeds (lean animal proteins are best).  Fruits.  Vegetables.  Dairy products, such as milk, cheese, and yogurt (low fat is best).  Breads, grains, pasta, cereal, rice, and beans.  Fats such as olive oil, trans fat-free margarine, canola oil, avocado, and olives. DOES EVERYONE WITH DIABETES MELLITUS HAVE THE SAME MEAL PLAN? Because every person with diabetes mellitus is different, there is not one meal plan that works for everyone. It is very important that you meet with a dietitian who will help you create a meal plan that is just right for you. Document Released: 06/27/2005 Document Revised: 10/05/2013 Document Reviewed: 08/27/2013 Exit[employer] Patient Information 2015 [employer]. This   information is not intended to replace advice given to you by your health care provider. Make sure you discuss any questions you have with your health care provider.   Dear Patient,  It has been recommended to you that you have a colonoscopy. It is your responsibility to carry through with this recommendation.   Did you  realize that colon cancer is the second leading cancer killer in the [place]. One in every 20 adults will get colon cancer. If all adults would go through the recommended screening for colon cancer (getting a colonoscopy), then there would be a 60% reduction in the number of people dying from colon cancer.  Colon cancer just doesn't come out of the blue. It starts off as a small polyp which over time grows into a cancer. A colonoscopy can prevent cancer and in many cases detected when it is at a very treatable phase. Small colon cancers can have cure rates of 95%. Advanced colon cancer, which often occurs in people who do not do their screenings, have cure rates less than 20%. The risk of colon cancer advances with age. Most adults should have regular colonoscopies every 10 years starting at age 51. This recommendation can vary depending on a person's medical history.  Health-care laws now allow for you to call the gastroenterologist office directly in order to set yourself up for this very important tests. Today we have recommended to you that you do this test. This test may save your life. Failure to do this test puts you at risk for premature death from colon cancer. Do the right thing and schedule this test now.  Here as a list of specialists we recommend in the surrounding area. When you call their office let them know that you are a patient of our practice in your interested in doing a screening colonoscopy. They should assist you without problems. You will need the following information when you called them: 1-name of which Dr. you see, 2-your insurance information, 3-a list of medications that you currently take, 4-any allergies you have to medications.  [place] gastroenterologist Dr. Milton Ferguson, Dr Felicie Morn gastroenterologist   [place] [place]   [phone]  [facility] gastroenterology (Dr.  Garnetta Buddy and [place]) [phone]  [facility] gastroenterology (Dr. Leia Alf, Harrietta Guardian, [place]) [phone]  Each group of specialists has assured [place] that when you called them they will help you get your colonoscopy set up. Should you have problems please let us know. Be sure to call soon. Sincerely, Pearson Forster, Dr Mickie Hillier, [place]

## 2015-02-23 NOTE — Progress Notes (Signed)
   Subjective:    Patient ID: Molly Ryan, female    DOB: 1961/10/08, 54 y.o.   MRN: [id number]  HPIMed check up. Needs refills on celexa and lunesta. No side effects or problems with the meds.  Prediabetes. A1C today. 7.6. Pt states no concerns today.   Had ovaries removed 2 years ago Not watching diet This patient not watching her diet consuming too many sodas not exercising she denies excessive thirst or urination. Denies high fever chills. She states her moods are overall doing good. Review of Systems  Constitutional: Negative for activity change, appetite change and fatigue.  HENT: Negative for congestion.   Respiratory: Negative for cough.   Cardiovascular: Negative for chest pain.  Gastrointestinal: Negative for abdominal pain.  Endocrine: Negative for polydipsia and polyphagia.  Neurological: Negative for weakness.  Psychiatric/Behavioral: Negative for confusion.       Objective:   Physical Exam  Constitutional: She appears well-nourished. No distress.  Cardiovascular: Normal rate, regular rhythm and normal heart sounds.   No murmur heard. Pulmonary/Chest: Effort normal and breath sounds normal. No respiratory distress.  Musculoskeletal: She exhibits no edema.  Lymphadenopathy:    She has no cervical adenopathy.  Neurological: She is alert. She exhibits normal muscle tone.  Psychiatric: Her behavior is normal.  Vitals reviewed.         Assessment & Plan:  1. Prediabetes Her A1c reflects diabetes - POCT glycosylated hemoglobin (Hb A1C)  2. Chronic insomnia Refills on medication given  3. Nonintractable migraine, unspecified migraine type Migraine stable uses Maxalt when necessary  4. Type 2 diabetes mellitus without complication Has type 2 diabetes education regarding diet given education regarding exercise taking medication metformin half tablet twice a Molly - Microalbumin, urine  5. Encounter for screening for lipid disorder Check lab work may  need to be on a statin because of diabetes - Lipid panel  6. High risk medication use Check lab work - Hepatic function panel - Basic metabolic panel Patient was advised to follow-up 3-4 months recheck A1c if any side effects medication medicine

## 2015-03-18 LAB — HEPATIC FUNCTION PANEL
ALT: 33 IU/L — ABNORMAL HIGH (ref 0–32)
AST: 28 IU/L (ref 0–40)
Albumin: 4.4 g/dL (ref 3.5–5.5)
Alkaline Phosphatase: 82 IU/L (ref 39–117)
Bilirubin Total: 0.5 mg/dL (ref 0.0–1.2)
Bilirubin, Direct: 0.15 mg/dL (ref 0.00–0.40)
Total Protein: 6.9 g/dL (ref 6.0–8.5)

## 2015-03-18 LAB — BASIC METABOLIC PANEL
BUN/Creatinine Ratio: 14 (ref 9–23)
BUN: 10 mg/dL (ref 6–24)
CO2: 25 mmol/L (ref 18–29)
Calcium: 9.5 mg/dL (ref 8.7–10.2)
Chloride: 99 mmol/L (ref 97–108)
Creatinine, Ser: 0.69 mg/dL (ref 0.57–1.00)
GFR calc Af Amer: 115 mL/min/{1.73_m2} (ref >59)
GFR calc non Af Amer: 100 mL/min/{1.73_m2} (ref >59)
Glucose: 173 mg/dL — ABNORMAL HIGH (ref 65–99)
Potassium: 4.2 mmol/L (ref 3.5–5.2)
Sodium: 140 mmol/L (ref 134–144)

## 2015-03-18 LAB — LIPID PANEL
Chol/HDL Ratio: 2.7 ratio units (ref 0.0–4.4)
Cholesterol, Total: 161 mg/dL (ref 100–199)
HDL: 60 mg/dL (ref >39)
LDL Calculated: 81 mg/dL (ref 0–99)
Triglycerides: 99 mg/dL (ref 0–149)
VLDL Cholesterol Cal: 20 mg/dL (ref 5–40)

## 2015-03-18 LAB — MICROALBUMIN, URINE: Microalbumin, Urine: 4.9 ug/mL

## 2015-03-19 ENCOUNTER — Encounter: Payer: Self-pay | Admitting: Family Medicine

## 2015-03-29 ENCOUNTER — Ambulatory Visit: Payer: BLUE CROSS/BLUE SHIELD | Admitting: Nurse Practitioner

## 2015-03-30 ENCOUNTER — Encounter: Payer: Self-pay | Admitting: Nurse Practitioner

## 2015-03-30 ENCOUNTER — Ambulatory Visit (HOSPITAL_COMMUNITY)
Admission: RE | Admit: 2015-03-30 | Discharge: 2015-03-30 | Disposition: A | Discharge status: Home / Self Care | Payer: BLUE CROSS/BLUE SHIELD | Source: Ambulatory Visit | Attending: Nurse Practitioner | Admitting: Nurse Practitioner

## 2015-03-30 ENCOUNTER — Ambulatory Visit (INDEPENDENT_AMBULATORY_CARE_PROVIDER_SITE_OTHER): Payer: BLUE CROSS/BLUE SHIELD | Admitting: Nurse Practitioner

## 2015-03-30 VITALS — BP 130/88 | Temp 98.5°F | Wt 199.0 lb

## 2015-03-30 DIAGNOSIS — K5732 Diverticulitis of large intestine without perforation or abscess without bleeding: Secondary | ICD-10-CM

## 2015-03-30 DIAGNOSIS — R319 Hematuria, unspecified: Secondary | ICD-10-CM | POA: Diagnosis present

## 2015-03-30 DIAGNOSIS — R630 Anorexia: Secondary | ICD-10-CM | POA: Diagnosis not present

## 2015-03-30 DIAGNOSIS — K5730 Diverticulosis of large intestine without perforation or abscess without bleeding: Secondary | ICD-10-CM | POA: Diagnosis not present

## 2015-03-30 DIAGNOSIS — K449 Diaphragmatic hernia without obstruction or gangrene: Secondary | ICD-10-CM | POA: Diagnosis not present

## 2015-03-30 DIAGNOSIS — K5790 Diverticulosis of intestine, part unspecified, without perforation or abscess without bleeding: Secondary | ICD-10-CM | POA: Insufficient documentation

## 2015-03-30 DIAGNOSIS — R1032 Left lower quadrant pain: Secondary | ICD-10-CM

## 2015-03-30 DIAGNOSIS — R109 Unspecified abdominal pain: Secondary | ICD-10-CM | POA: Insufficient documentation

## 2015-03-30 DIAGNOSIS — R1012 Left upper quadrant pain: Secondary | ICD-10-CM | POA: Diagnosis not present

## 2015-03-30 DIAGNOSIS — K439 Ventral hernia without obstruction or gangrene: Secondary | ICD-10-CM | POA: Diagnosis not present

## 2015-03-30 DIAGNOSIS — K760 Fatty (change of) liver, not elsewhere classified: Secondary | ICD-10-CM | POA: Diagnosis not present

## 2015-03-30 DIAGNOSIS — R5383 Other fatigue: Secondary | ICD-10-CM

## 2015-03-30 IMAGING — CT CT ABD-PELV W/ CM
2 of 4 series · 16 of 46 positions shown, 18 images · IV contrast (Omnipaque 300)
Comparison: [DATE]

CLINICAL DATA: Two day history of left abdominal pain.  Hematuria

EXAM:
CT ABDOMEN AND PELVIS WITH CONTRAST
TECHNIQUE: Multidetector CT imaging of the abdomen and pelvis was performed
using the standard protocol following bolus administration of
intravenous contrast. Oral contrast was also administered.
CONTRAST:  100mL OMNIPAQUE IOHEXOL 300 MG/ML  SOLN

[Series 2: abd_pel_with 5.0 b40f · axial · 0.76mm/px · z∈[-497,-77]mm · 13 of 94 slices shown, 15 images]
[im 5/94  soft-tissue]
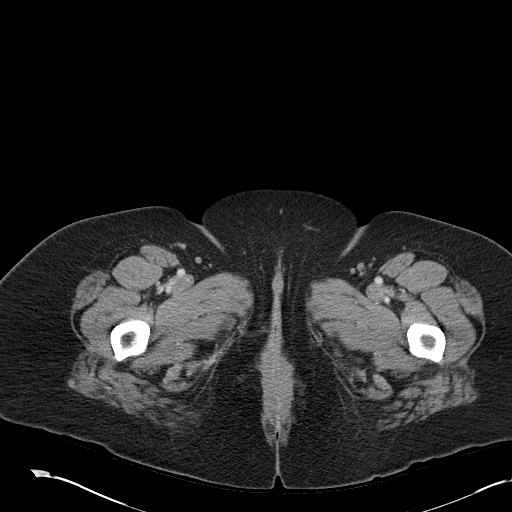
[im 5/94  bone]
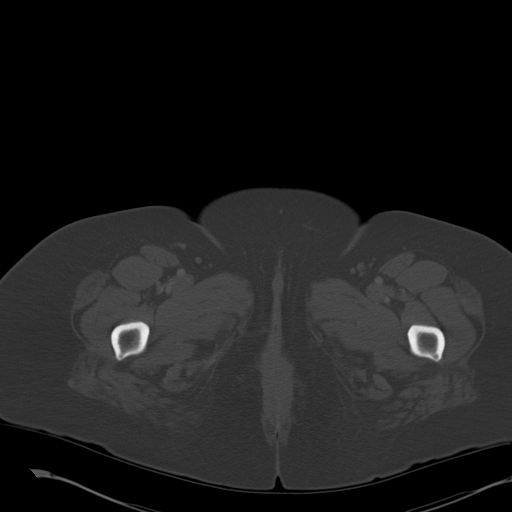
[im 14/94  soft-tissue]
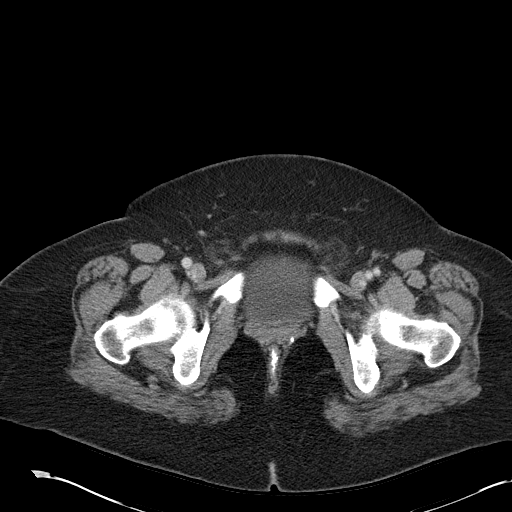
[im 18/94  soft-tissue]
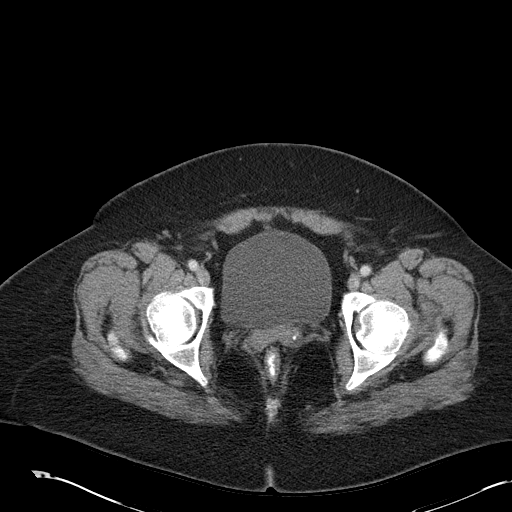
[im 27/94  soft-tissue]
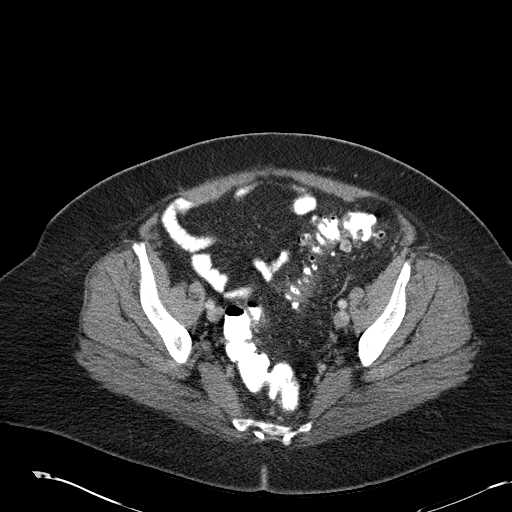
[im 32/94  soft-tissue]
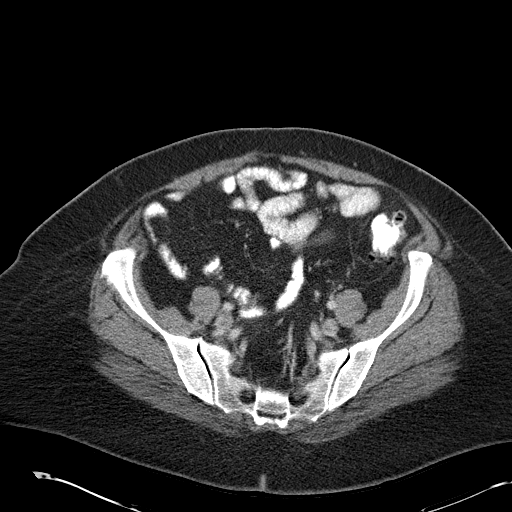
[im 40/94  soft-tissue]
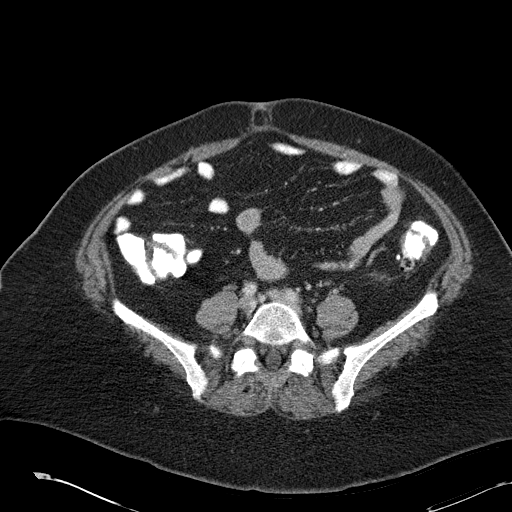
[im 49/94  soft-tissue]
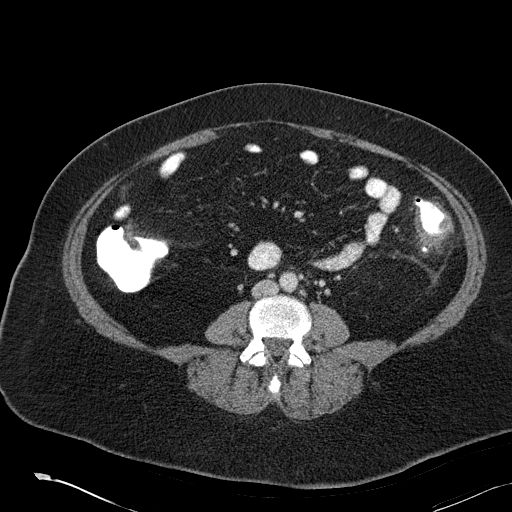
[im 54/94  soft-tissue]
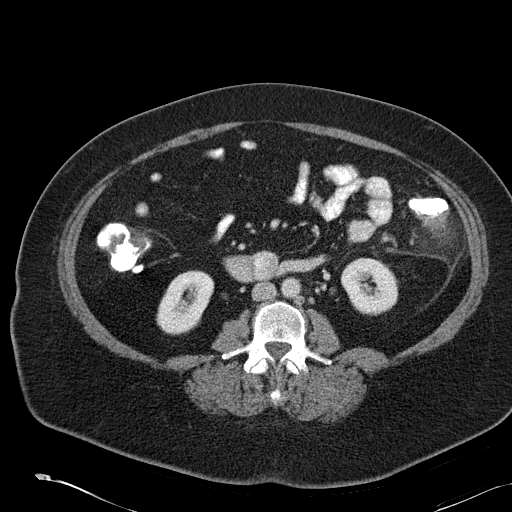
[im 63/94  soft-tissue]
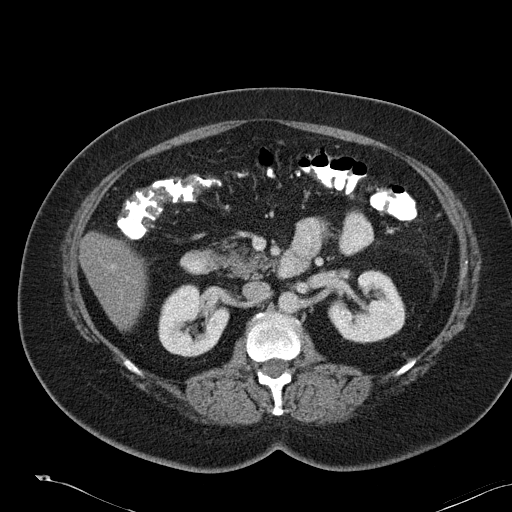
[im 63/94  bone]
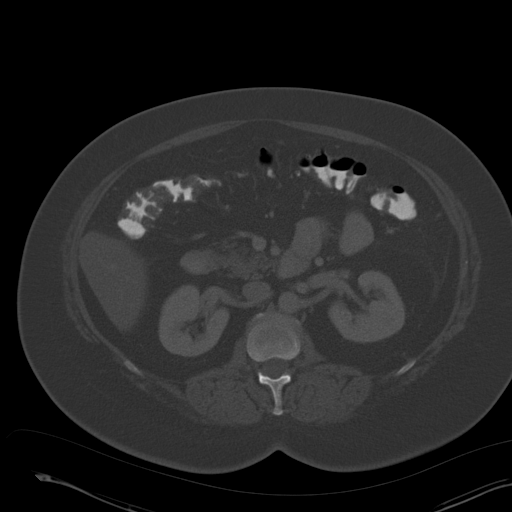
[im 67/94  soft-tissue]
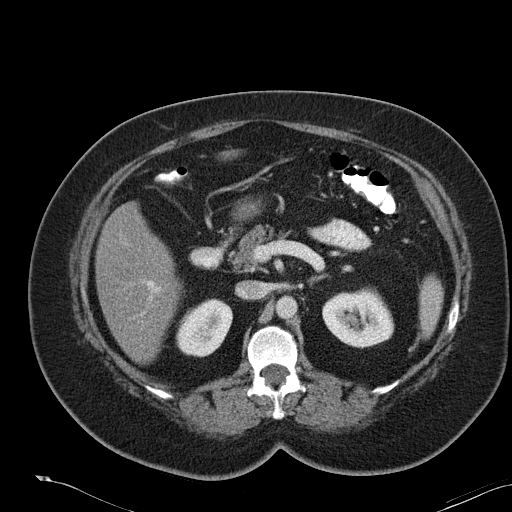
[im 76/94  soft-tissue]
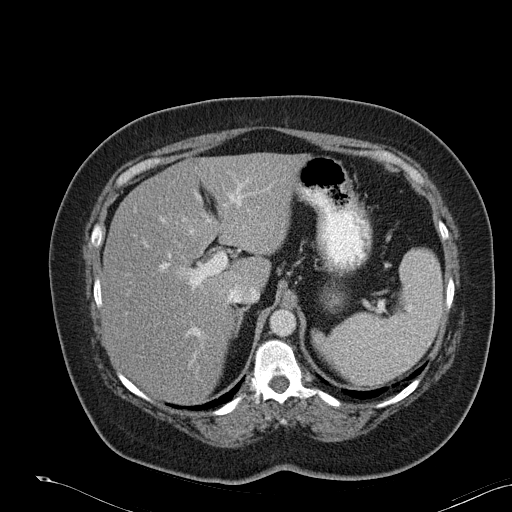
[im 80/94  soft-tissue]
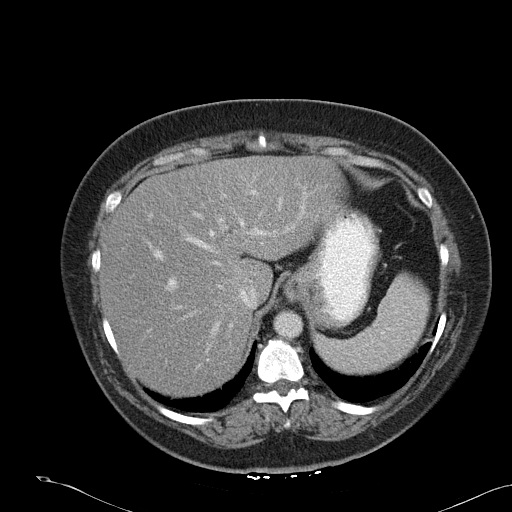
[im 89/94  soft-tissue]
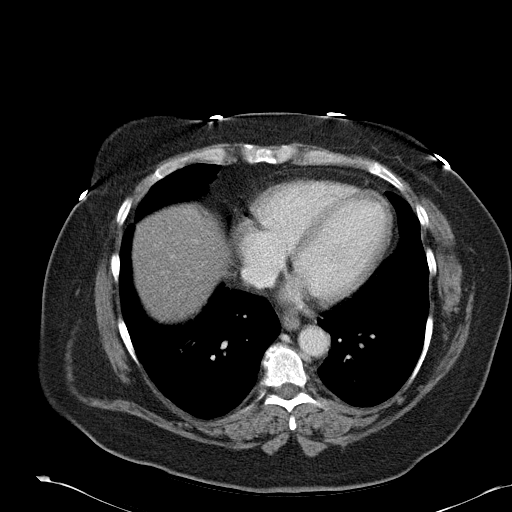

[Series 3: abd_pel_with 3.0 spo cor · coronal · 0.77mm/px · 3 of 93 slices shown]
[im 31/93  soft-tissue]
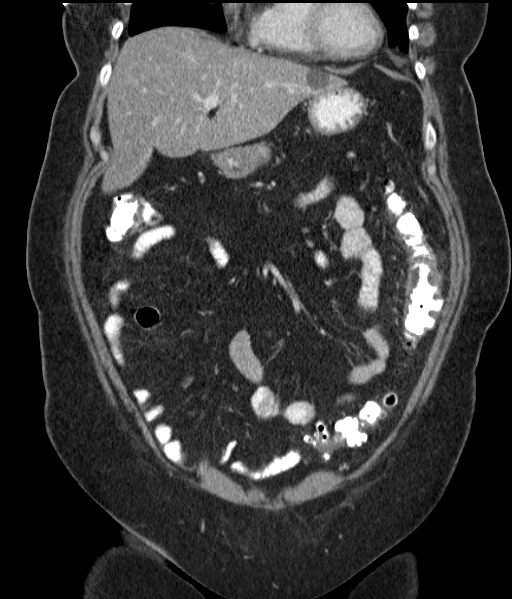
[im 41/93  soft-tissue]
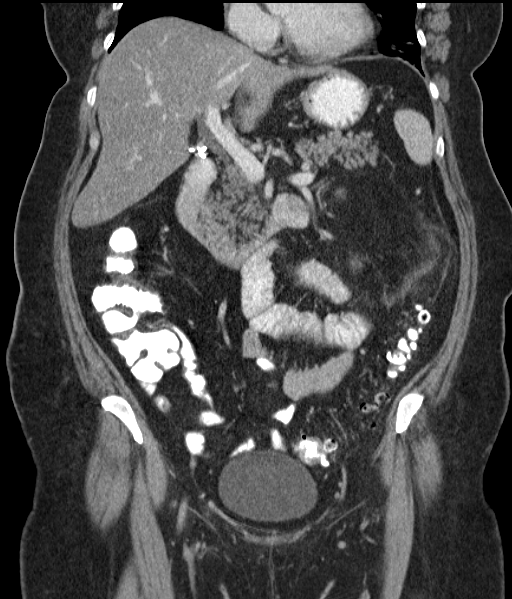
[im 52/93  soft-tissue]
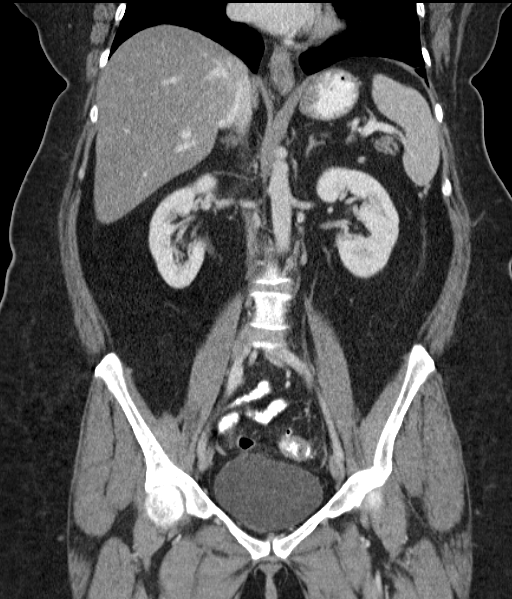

[16 of 46 positions shown; findings below may reference images not displayed]

FINDINGS: There is atelectatic change in the left base anteriorly. There is a
small hiatal hernia.

There is generalized hepatic steatosis. There is a small calcified
granuloma in the anterior segment of the right lobe of the liver.
There is a cyst in the lateral segment of the left lobe of the liver
measuring 1.9 x 1.9 cm. No other focal liver lesions are identified.
Gallbladder is absent. There is no appreciable biliary duct
dilatation.

Spleen, pancreas, adrenals appear normal.

Kidneys bilaterally show no mass or hydronephrosis. There is no
renal or ureteral calculus on either side.

In the pelvis, the urinary bladder is midline with normal wall
thickness. There is sigmoid diverticulosis with sigmoid wall
muscular hypertrophy, probably due to chronic diverticulosis. There
is no pelvic mass or fluid collection.

There is mesenteric inflammation surrounding the mid descending
colon. There are several diverticula in this area. It is felt that
this area represents focal diverticulitis in the mid descending
colon. There is no abscess or perforation in this area. There is
thickening of the nearby lateral conal fascia on the left due to
inflammation.

There is no bowel obstruction. No free air or portal venous air.
There is a rather minimal ventral hernia containing only fat.

There is no ascites, adenopathy, or abscess in the abdomen pelvis.
There is no abdominal aortic aneurysm. There are no blastic or lytic
bone lesions.
IMPRESSION: Mid descending colon diverticulitis without abscess or perforation.

Extensive sigmoid diverticulosis with muscular hypertrophy from the
diverticulosis throughout much of the sigmoid colon.

Uterus, gallbladder, and appendix absent. Small ventral hernia
containing only fat. Small hiatal hernia present.

Generalized hepatic steatosis.

These results were called by telephone at the time of interpretation
on [DATE] at [DATE] to WILLIAM EDGARDO, NP , who verbally
acknowledged these results.

## 2015-03-30 MED ORDER — IOHEXOL 300 MG/ML  SOLN
100.0000 mL | Freq: Once | INTRAMUSCULAR | Status: AC | PRN
  Administered 2015-03-30: 100 mL via INTRAVENOUS

## 2015-03-30 MED ORDER — CIPROFLOXACIN HCL 500 MG PO TABS: 500.0000 mg | ORAL_TABLET | Freq: Two times a day (BID) | ORAL | Status: DC

## 2015-03-30 MED ORDER — METRONIDAZOLE 500 MG PO TABS: 500.0000 mg | ORAL_TABLET | Freq: Three times a day (TID) | ORAL | Status: DC

## 2015-04-02 ENCOUNTER — Encounter: Payer: Self-pay | Admitting: Nurse Practitioner

## 2015-04-02 DIAGNOSIS — K5732 Diverticulitis of large intestine without perforation or abscess without bleeding: Secondary | ICD-10-CM | POA: Insufficient documentation

## 2015-04-02 NOTE — Progress Notes (Signed)
Subjective:  Presents with complaints of left-sided abdominal pain for the past 2 days. Describes as sharp, now constant. Has felt hot at times, no documented fever. Fatigue. No appetite. Taking some fluids. No urinary symptoms. No constipation diarrhea. Nausea but no vomiting. No blood in her stools. Pain will "catch" her at times. Tolerable when she places slight pressure on the area. No vaginal discharge. Married, same sexual partner.  Objective:   BP 130/88 mmHg  Temp(Src) 98.5 F (36.9 C) (Oral)  Wt 199 lb (90.266 kg) NAD. Alert, oriented. Lungs clear. Heart regular rate rhythm. Abdomen mildly obese soft nondistended with active bowel sounds 4; distinct significant tenderness in the left mid abdominal area towards the left lower quadrant and into the anterior axillary line. No obvious masses. Urine dipstick and urine micro-negative.    Assessment: Diverticulitis of colon without hemorrhage  Left upper quadrant pain - Plan: CBC with Differential/Platelet, Hepatic function panel, Basic metabolic panel, CT Abdomen Pelvis W Contrast  Left lower quadrant pain - Plan: POCT urinalysis dipstick, CBC with Differential/Platelet, Hepatic function panel, Basic metabolic panel, CT Abdomen Pelvis W Contrast  Anorexia  Other fatigue  Plan:  Meds ordered this encounter  Medications  . ciprofloxacin (CIPRO) 500 MG tablet    Sig: Take 1 tablet (500 mg total) by mouth 2 (two) times daily.    Dispense:  20 tablet    Refill:  0    Order Specific Question:  Supervising Provider    Answer:  Mikey Kirschner [[id number]]  . metroNIDAZOLE (FLAGYL) 500 MG tablet    Sig: Take 1 tablet (500 mg total) by mouth 3 (three) times daily.    Dispense:  30 tablet    Refill:  0    Order Specific Question:  Supervising Provider    Answer:  Mikey Kirschner [[id number]]   See CT scan results. Antibiotics prescribed. Follow-up early next week, go to ED this weekend if symptoms worsen.

## 2015-04-06 ENCOUNTER — Ambulatory Visit (INDEPENDENT_AMBULATORY_CARE_PROVIDER_SITE_OTHER): Payer: BLUE CROSS/BLUE SHIELD | Admitting: Nurse Practitioner

## 2015-04-06 ENCOUNTER — Encounter: Payer: Self-pay | Admitting: Nurse Practitioner

## 2015-04-06 VITALS — BP 130/82 | Ht 61.0 in | Wt 199.1 lb

## 2015-04-06 DIAGNOSIS — K5732 Diverticulitis of large intestine without perforation or abscess without bleeding: Secondary | ICD-10-CM

## 2015-04-06 NOTE — Progress Notes (Signed)
Subjective:  Presents for recheck on her diverticulitis. Pain has completely resolved. Continues to take her antibiotics. No fever. Diarrhea much improved. No blood in her stool. Taking fluids well. Voiding normal limit.  Objective:   BP 130/82 mmHg  Ht 5\' 1"  (1.549 m)  Wt 199 lb 2 oz (90.323 kg)  BMI 37.64 kg/m2 NAD. Alert, oriented. Lungs clear. Heart regular rate rhythm. Abdomen mildly obese soft nondistended with active bowel sounds 4; nontender to palpation. Reviewed recent CT scan of the abdomen results with patient.  Assessment:  Problem List Items Addressed This Visit      Digestive   Diverticulitis of colon without hemorrhage - Primary     Plan: Complete antibiotics as directed. Recheck if further problems. Discussed dietary measures to help avoid diverticulitis.

## 2015-06-26 ENCOUNTER — Ambulatory Visit: Payer: BLUE CROSS/BLUE SHIELD | Admitting: Nurse Practitioner

## 2015-08-28 ENCOUNTER — Other Ambulatory Visit: Payer: Self-pay | Admitting: Family Medicine

## 2015-08-29 NOTE — Telephone Encounter (Signed)
May have this and 4 refills 

## 2015-09-11 ENCOUNTER — Encounter: Payer: Self-pay | Admitting: Nurse Practitioner

## 2015-09-11 ENCOUNTER — Ambulatory Visit (INDEPENDENT_AMBULATORY_CARE_PROVIDER_SITE_OTHER): Payer: BLUE CROSS/BLUE SHIELD | Admitting: Nurse Practitioner

## 2015-09-11 VITALS — BP 142/84 | Ht 61.0 in | Wt 194.2 lb

## 2015-09-11 DIAGNOSIS — E119 Type 2 diabetes mellitus without complications: Secondary | ICD-10-CM | POA: Diagnosis not present

## 2015-09-11 DIAGNOSIS — G4700 Insomnia, unspecified: Secondary | ICD-10-CM

## 2015-09-11 DIAGNOSIS — F5104 Psychophysiologic insomnia: Secondary | ICD-10-CM

## 2015-09-11 DIAGNOSIS — R1031 Right lower quadrant pain: Secondary | ICD-10-CM | POA: Diagnosis not present

## 2015-09-11 DIAGNOSIS — Z23 Encounter for immunization: Secondary | ICD-10-CM | POA: Diagnosis not present

## 2015-09-11 LAB — POCT GLYCOSYLATED HEMOGLOBIN (HGB A1C): Hemoglobin A1C: 7.3

## 2015-09-11 MED ORDER — PHENTERMINE HCL 37.5 MG PO TABS: 37.5000 mg | ORAL_TABLET | Freq: Every day | ORAL | Status: DC

## 2015-09-11 MED ORDER — ESZOPICLONE 2 MG PO TABS: ORAL_TABLET | ORAL | Status: DC

## 2015-09-11 MED ORDER — METFORMIN HCL 500 MG PO TABS: ORAL_TABLET | ORAL | Status: DC

## 2015-09-11 MED ORDER — CITALOPRAM HYDROBROMIDE 20 MG PO TABS: 20.0000 mg | ORAL_TABLET | Freq: Every day | ORAL | Status: DC

## 2015-09-14 ENCOUNTER — Encounter: Payer: Self-pay | Admitting: Nurse Practitioner

## 2015-09-14 DIAGNOSIS — E119 Type 2 diabetes mellitus without complications: Secondary | ICD-10-CM | POA: Insufficient documentation

## 2015-09-14 NOTE — Progress Notes (Signed)
Subjective:  Presents for recheck. Has cut back on sugary beverages; drinks regular soda or sweet tea only once per week. Limited exercise. No CP/ischemic type pain or SOB. Celexa working well. Philis Fendt for sleep.   Objective:   BP 142/84 mmHg  Ht 5\' 1"  (1.549 m)  Wt 194 lb 4 oz (88.111 kg)  BMI 36.72 kg/m2 NAD. Alert, oriented. Lungs clear. Heart RRR. Significant central obesity.  Results for orders placed or performed in visit on 09/11/15  POCT HgB A1C  Result Value Ref Range   Hemoglobin A1C 7.3      Assessment:  Problem List Items Addressed This Visit      Endocrine   Type 2 diabetes mellitus without complication, without long-term current use of insulin (HCC) - Primary   Relevant Medications   metFORMIN (GLUCOPHAGE) 500 MG tablet   Other Relevant Orders   POCT HgB A1C (Completed)     Other   RESOLVED: Abdominal pain, right lower quadrant   Chronic insomnia   Morbid obesity due to excess calories (HCC)   Relevant Medications   phentermine (ADIPEX-P) 37.5 MG tablet   metFORMIN (GLUCOPHAGE) 500 MG tablet        Plan:  Meds ordered this encounter  Medications  . phentermine (ADIPEX-P) 37.5 MG tablet    Sig: Take 1 tablet (37.5 mg total) by mouth daily before breakfast.    Dispense:  30 tablet    Refill:  0    Order Specific Question:  Supervising Provider    Answer:  Mikey Kirschner [[id number]]  . citalopram (CELEXA) 20 MG tablet    Sig: Take 1 tablet (20 mg total) by mouth daily.    Dispense:  30 tablet    Refill:  5    Order Specific Question:  Supervising Provider    Answer:  Mikey Kirschner [[id number]]  . eszopiclone (LUNESTA) 2 MG TABS tablet    Sig: TAKE 1 TABLET BY MOUTH IMMEDIATELY BEFORE BEDTIME    Dispense:  30 tablet    Refill:  5    Order Specific Question:  Supervising Provider    Answer:  Mikey Kirschner [[id number]]  . metFORMIN (GLUCOPHAGE) 500 MG tablet    Sig: Take one half tablet BID    Dispense:  30 tablet    Refill:  5    Order Specific  Question:  Supervising Provider    Answer:  Maggie Font   Explained that testing indicates she is now diabetic. Defers other diabetes medicine, but would like to try weight loss med. Discussed importance of weight loss and regular activity.  Return in about 1 month (around 10/11/2015) for recheck. If no improvement over the next 3-4 months, recommend reconsidering adding med for diabetes. Also, strongly recommend wellness physical.

## 2015-10-11 ENCOUNTER — Ambulatory Visit (INDEPENDENT_AMBULATORY_CARE_PROVIDER_SITE_OTHER): Payer: BLUE CROSS/BLUE SHIELD | Admitting: Nurse Practitioner

## 2015-10-11 ENCOUNTER — Encounter: Payer: Self-pay | Admitting: Nurse Practitioner

## 2015-10-11 VITALS — BP 134/92 | Ht 61.0 in | Wt 189.5 lb

## 2015-10-11 DIAGNOSIS — G4700 Insomnia, unspecified: Secondary | ICD-10-CM | POA: Diagnosis not present

## 2015-10-11 DIAGNOSIS — F5104 Psychophysiologic insomnia: Secondary | ICD-10-CM

## 2015-10-11 MED ORDER — PHENTERMINE HCL 37.5 MG PO TABS: 37.5000 mg | ORAL_TABLET | Freq: Every day | ORAL | Status: DC

## 2015-10-11 NOTE — Progress Notes (Signed)
Subjective:  Presents for recheck. Doing well on Phentermine. Mainly dry mouth. Has history of chronic insomnia. No change. No other side effects.   Objective:   BP 134/92 mmHg  Ht 5\' 1"  (1.549 m)  Wt 189 lb 8 oz (85.957 kg)  BMI 35.82 kg/m2 NAD. Alert, oriented. Lungs clear. Heart RRR. Has lost approx 5 lbs since last visit.   Assessment:  Problem List Items Addressed This Visit      Other   Chronic insomnia - Primary   Morbid obesity due to excess calories (HCC)   Relevant Medications   phentermine (ADIPEX-P) 37.5 MG tablet     Plan:  Meds ordered this encounter  Medications  . phentermine (ADIPEX-P) 37.5 MG tablet    Sig: Take 1 tablet (37.5 mg total) by mouth daily before breakfast.    Dispense:  30 tablet    Refill:  2    Order Specific Question:  Supervising Provider    Answer:  Mikey Kirschner [[id number]]   Encouraged healthy diet, regular activity. Recheck in 3 months if she wishes to continue med. Mammogram is UTD. Strongly recommend wellness physical.

## 2015-10-12 ENCOUNTER — Telehealth: Payer: Self-pay | Admitting: Family Medicine

## 2015-10-12 NOTE — Telephone Encounter (Signed)
Rx prior auth APPROVED for pt's phentermine (ADIPEX-P) 37.5 MG tablet valid 09/12/15-01/10/16 Case# [id number] through [facility]

## 2016-01-10 ENCOUNTER — Ambulatory Visit: Payer: BLUE CROSS/BLUE SHIELD | Admitting: Nurse Practitioner

## 2016-01-29 ENCOUNTER — Ambulatory Visit (INDEPENDENT_AMBULATORY_CARE_PROVIDER_SITE_OTHER): Payer: BLUE CROSS/BLUE SHIELD | Admitting: Nurse Practitioner

## 2016-01-29 ENCOUNTER — Encounter: Payer: Self-pay | Admitting: Nurse Practitioner

## 2016-01-29 VITALS — BP 124/84 | Ht 61.0 in | Wt 190.5 lb

## 2016-01-29 DIAGNOSIS — F418 Other specified anxiety disorders: Secondary | ICD-10-CM | POA: Diagnosis not present

## 2016-01-29 DIAGNOSIS — F5104 Psychophysiologic insomnia: Secondary | ICD-10-CM

## 2016-01-29 DIAGNOSIS — E119 Type 2 diabetes mellitus without complications: Secondary | ICD-10-CM | POA: Diagnosis not present

## 2016-01-29 DIAGNOSIS — G4700 Insomnia, unspecified: Secondary | ICD-10-CM

## 2016-01-29 LAB — POCT GLYCOSYLATED HEMOGLOBIN (HGB A1C): Hemoglobin A1C: 6.7

## 2016-01-29 MED ORDER — ESZOPICLONE 2 MG PO TABS: ORAL_TABLET | ORAL | Status: DC

## 2016-01-29 MED ORDER — CITALOPRAM HYDROBROMIDE 20 MG PO TABS: 20.0000 mg | ORAL_TABLET | Freq: Every day | ORAL | Status: DC

## 2016-01-29 NOTE — Progress Notes (Signed)
Subjective:  Presents for routine follow up. Has done well maintaining her weight loss. Takes 1/2 tab Metformin most days just once a day; misses second dose. Overall sleeping well on Lunesta. Celexa seems to working well. No CP/ischemic type pain or SOB.   Objective:   BP 124/84 mmHg  Ht 5\' 1"  (1.549 m)  Wt 190 lb 8 oz (86.41 kg)  BMI 36.01 kg/m2 NAD. Alert, oriented. Lungs clear. Heart RRR. Lower extremities no edema.  Results for orders placed or performed in visit on 01/29/16  POCT HgB A1C  Result Value Ref Range   Hemoglobin A1C 6.7    A1C was 7.3 on 09/11/15.  Assessment:  Problem List Items Addressed This Visit      Endocrine   Type 2 diabetes mellitus without complication, without long-term current use of insulin ([place])     Other   Chronic insomnia   Depression with anxiety    Other Visit Diagnoses    Type 2 diabetes mellitus without complication, unspecified long term insulin use status ([place])    -  Primary    Relevant Orders    POCT HgB A1C (Completed)      Plan:  Meds ordered this encounter  Medications  . eszopiclone (LUNESTA) 2 MG TABS tablet    Sig: TAKE 1 TABLET BY MOUTH IMMEDIATELY BEFORE BEDTIME    Dispense:  30 tablet    Refill:  5    Order Specific Question:  Supervising Provider    Answer:  Mikey Kirschner [[id number]]  . citalopram (CELEXA) 20 MG tablet    Sig: Take 1 tablet (20 mg total) by mouth daily.    Dispense:  30 tablet    Refill:  5    Order Specific Question:  Supervising Provider    Answer:  Mikey Kirschner [[id number]]   Continue weight loss efforts. Strongly recommend preventive health physical. Take one whole tab of Metformin daily. Return in about 6 months (around 07/30/2016) for recheck.

## 2016-06-24 ENCOUNTER — Telehealth: Payer: Self-pay | Admitting: Family Medicine

## 2016-06-24 DIAGNOSIS — Z139 Encounter for screening, unspecified: Secondary | ICD-10-CM

## 2016-06-24 DIAGNOSIS — Z1159 Encounter for screening for other viral diseases: Secondary | ICD-10-CM

## 2016-06-24 NOTE — Telephone Encounter (Signed)
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

## 2016-06-24 NOTE — Telephone Encounter (Signed)
.   The patient will need titers for rubella as well as chickenpox. Also will need hepatitis B series as well as MMR. Also needs TB skin testing flu vaccine and tetanus shot. More than likely patient would benefit being referred to the health department for immunizations. She also needs to discuss with the school the fact that she does not have shot records to see if they require any other particular testing. We can order the titer testing through our office. The other things should be done through the health department(because we do not stock vaccines for her age group) if she has a copy of her previous shots I will be happy to transfer those onto this form and sign this.

## 2016-06-25 NOTE — Telephone Encounter (Signed)
Notified patient will need titers for rubella as well as chickenpox. Also will need hepatitis B series as well as MMR. Also needs TB skin testing flu vaccine and tetanus shot. More than likely patient would benefit being referred to the health department for immunizations. She also needs to discuss with the school the fact that she does not have shot records to see if they require any other particular testing. We can order the titer testing through our office. The other things should be done through the health department(because we do not stock vaccines for her age group) if she has a copy of her previous shots Dr. Nicki Reaper will be happy to transfer those onto this form and sign this. Patient verbalized understanding. Titers were ordered through [place].

## 2016-06-27 LAB — RUBELLA SCREEN: Rubella Antibodies, IGG: 4.81 index (ref >0.99)

## 2016-06-27 LAB — VARICELLA ZOSTER ABS, IGG/IGM
Varicella IgM: <0.91 index (ref 0.00–0.90)
Varicella zoster IgG: 674 index (ref >165)

## 2016-08-02 ENCOUNTER — Ambulatory Visit: Payer: BLUE CROSS/BLUE SHIELD | Admitting: Nurse Practitioner

## 2016-10-26 ENCOUNTER — Other Ambulatory Visit: Payer: Self-pay | Admitting: Nurse Practitioner

## 2016-10-28 NOTE — Telephone Encounter (Signed)
Last OV was 04/17-This issue has not been addressed since prior to that. Would you like to refill?

## 2016-11-15 ENCOUNTER — Encounter: Payer: Self-pay | Admitting: Nurse Practitioner

## 2016-11-15 ENCOUNTER — Ambulatory Visit (INDEPENDENT_AMBULATORY_CARE_PROVIDER_SITE_OTHER): Payer: BLUE CROSS/BLUE SHIELD | Admitting: Nurse Practitioner

## 2016-11-15 VITALS — BP 132/82 | Ht 61.0 in | Wt 195.0 lb

## 2016-11-15 DIAGNOSIS — R5383 Other fatigue: Secondary | ICD-10-CM

## 2016-11-15 DIAGNOSIS — F5104 Psychophysiologic insomnia: Secondary | ICD-10-CM

## 2016-11-15 DIAGNOSIS — F418 Other specified anxiety disorders: Secondary | ICD-10-CM | POA: Diagnosis not present

## 2016-11-15 DIAGNOSIS — E119 Type 2 diabetes mellitus without complications: Secondary | ICD-10-CM

## 2016-11-15 DIAGNOSIS — G43109 Migraine with aura, not intractable, without status migrainosus: Secondary | ICD-10-CM | POA: Diagnosis not present

## 2016-11-15 DIAGNOSIS — Z23 Encounter for immunization: Secondary | ICD-10-CM

## 2016-11-15 LAB — POCT GLYCOSYLATED HEMOGLOBIN (HGB A1C): Hemoglobin A1C: 7.5

## 2016-11-15 MED ORDER — RIZATRIPTAN BENZOATE 10 MG PO TBDP: 10.0000 mg | ORAL_TABLET | ORAL | 0 refills | Status: DC | PRN

## 2016-11-15 MED ORDER — ESZOPICLONE 2 MG PO TABS: ORAL_TABLET | ORAL | 5 refills | Status: DC

## 2016-11-15 NOTE — Patient Instructions (Signed)
Bydureon Victoza Trulicity [place]um

## 2016-11-15 NOTE — Progress Notes (Signed)
Subjective:  Presents for routine follow-up. Limited activity. Has not done as well with her diet lately, increased sugar intake. Gets yearly eye exams. Would like to restart her Johnnye Sima, has taken this for years but came off of it for a while. Had 1 migraine headache recently, no change in symptomatology. Would like a refill of her Maxalt which works well. Is only taking her metformin once daily at this point. Depression and anxiety stable on Celexa. Mild generalized fatigue at times.  Objective:   BP 132/82   Ht 5\' 1"  (1.549 m)   Wt 195 lb (88.5 kg)   BMI 36.84 kg/m  NAD. Alert, oriented. Cheerful affect. Lungs clear. Heart regular rate rhythm. Lower extremities trace nonpitting edema. Diabetic Foot Exam - Simple   Simple Foot Form Diabetic Foot exam was performed with the following findings:  Yes 11/15/2016  9:20 AM  Visual Inspection No deformities, no ulcerations, no other skin breakdown bilaterally:  Yes Sensation Testing Intact to touch and monofilament testing bilaterally:  Yes Pulse Check See comments:  Yes Comments DP pulses present bilat. Toes warm with normal cap refill.     Results for orders placed or performed in visit on 11/15/16  POCT glycosylated hemoglobin (Hb A1C)  Result Value Ref Range   Hemoglobin A1C 7.5      Assessment:   Problem List Items Addressed This Visit      Cardiovascular and Mediastinum   Migraine headache with aura   Relevant Medications   rizatriptan (MAXALT-MLT) 10 MG disintegrating tablet     Other   Chronic insomnia   Depression with anxiety    Other Visit Diagnoses    Diabetes mellitus without complication ([place])    -  Primary   Relevant Orders   POCT glycosylated hemoglobin (Hb A1C) (Completed)   Lipid panel   Hepatic function panel   Basic metabolic panel   Microalbumin / creatinine urine ratio   Need for vaccination       Relevant Orders   Flu Vaccine QUAD 36+ mos IM (Completed)   Pneumococcal polysaccharide vaccine  23-valent greater than or equal to 2yo subcutaneous/IM (Completed)   Fatigue, unspecified type       Relevant Orders   TSH   VITAMIN D 25 Hydroxy (Vit-D Deficiency, Fractures)       Plan:  Meds ordered this encounter  Medications  . eszopiclone (LUNESTA) 2 MG TABS tablet    Sig: TAKE 1 TABLET BY MOUTH IMMEDIATELY BEFORE BEDTIME    Dispense:  30 tablet    Refill:  5    Order Specific Question:   Supervising Provider    Answer:   Mikey Kirschner [[id number]]  . rizatriptan (MAXALT-MLT) 10 MG disintegrating tablet    Sig: Take 1 tablet (10 mg total) by mouth as needed for migraine. May repeat in 2 hours if needed    Dispense:  10 tablet    Refill:  0    Order Specific Question:   Supervising Provider    Answer:   Mikey Kirschner [[id number]]   Encouraged regular walking program, healthy diet low in sugar and simple carbs and weight loss.  Restart Lunesta as directed. Flu vaccine and Pneumovax today. Return in about 4 months (around 03/15/2017) for diabetes checkup. Routine labs pending.

## 2016-11-16 LAB — BASIC METABOLIC PANEL
BUN/Creatinine Ratio: 19 (ref 9–23)
BUN: 14 mg/dL (ref 6–24)
CO2: 25 mmol/L (ref 18–29)
Calcium: 9.8 mg/dL (ref 8.7–10.2)
Chloride: 97 mmol/L (ref 96–106)
Creatinine, Ser: 0.75 mg/dL (ref 0.57–1.00)
GFR calc Af Amer: 105 mL/min/{1.73_m2} (ref >59)
GFR calc non Af Amer: 91 mL/min/{1.73_m2} (ref >59)
Glucose: 178 mg/dL — ABNORMAL HIGH (ref 65–99)
Potassium: 4.5 mmol/L (ref 3.5–5.2)
Sodium: 140 mmol/L (ref 134–144)

## 2016-11-16 LAB — HEPATIC FUNCTION PANEL
ALT: 23 IU/L (ref 0–32)
AST: 15 IU/L (ref 0–40)
Albumin: 4.6 g/dL (ref 3.5–5.5)
Alkaline Phosphatase: 85 IU/L (ref 39–117)
Bilirubin Total: 0.5 mg/dL (ref 0.0–1.2)
Bilirubin, Direct: 0.15 mg/dL (ref 0.00–0.40)
Total Protein: 7.2 g/dL (ref 6.0–8.5)

## 2016-11-16 LAB — VITAMIN D 25 HYDROXY (VIT D DEFICIENCY, FRACTURES): Vit D, 25-Hydroxy: 10.1 ng/mL — ABNORMAL LOW (ref 30.0–100.0)

## 2016-11-16 LAB — LIPID PANEL
Chol/HDL Ratio: 2.8 ratio units (ref 0.0–4.4)
Cholesterol, Total: 196 mg/dL (ref 100–199)
HDL: 69 mg/dL (ref >39)
LDL Calculated: 104 mg/dL — ABNORMAL HIGH (ref 0–99)
Triglycerides: 114 mg/dL (ref 0–149)
VLDL Cholesterol Cal: 23 mg/dL (ref 5–40)

## 2016-11-16 LAB — TSH: TSH: 1.27 u[IU]/mL (ref 0.450–4.500)

## 2016-11-16 LAB — MICROALBUMIN / CREATININE URINE RATIO
Creatinine, Urine: 60.1 mg/dL
Microalb/Creat Ratio: <5 mg/g creat (ref 0.0–30.0)
Microalbumin, Urine: <3 ug/mL

## 2016-11-18 ENCOUNTER — Other Ambulatory Visit: Payer: Self-pay | Admitting: Nurse Practitioner

## 2016-11-18 DIAGNOSIS — E559 Vitamin D deficiency, unspecified: Secondary | ICD-10-CM

## 2016-11-18 MED ORDER — VITAMIN D (ERGOCALCIFEROL) 1.25 MG (50000 UNIT) PO CAPS: 50000.0000 [IU] | ORAL_CAPSULE | ORAL | 2 refills | Status: DC

## 2016-12-07 ENCOUNTER — Other Ambulatory Visit: Payer: Self-pay | Admitting: Nurse Practitioner

## 2017-01-06 ENCOUNTER — Telehealth: Payer: Self-pay | Admitting: Nurse Practitioner

## 2017-01-06 NOTE — Telephone Encounter (Signed)
Last seen 11/15/16 for diabetic check up.

## 2017-01-06 NOTE — Telephone Encounter (Signed)
Requesting Rx for Metformin to [facility]. She said her Rx at the pharmacy has expired.

## 2017-03-14 ENCOUNTER — Ambulatory Visit: Payer: BLUE CROSS/BLUE SHIELD | Admitting: Nurse Practitioner

## 2017-03-24 ENCOUNTER — Encounter: Payer: Self-pay | Admitting: Family Medicine

## 2017-03-24 ENCOUNTER — Ambulatory Visit (INDEPENDENT_AMBULATORY_CARE_PROVIDER_SITE_OTHER): Payer: BLUE CROSS/BLUE SHIELD | Admitting: Family Medicine

## 2017-03-24 VITALS — BP 134/72 | Ht 61.0 in | Wt 194.5 lb

## 2017-03-24 DIAGNOSIS — L723 Sebaceous cyst: Secondary | ICD-10-CM

## 2017-03-24 DIAGNOSIS — D171 Benign lipomatous neoplasm of skin and subcutaneous tissue of trunk: Secondary | ICD-10-CM

## 2017-03-24 DIAGNOSIS — M25561 Pain in right knee: Secondary | ICD-10-CM | POA: Diagnosis not present

## 2017-03-24 MED ORDER — METFORMIN HCL 500 MG PO TABS: ORAL_TABLET | ORAL | 5 refills | Status: DC

## 2017-03-24 MED ORDER — DICLOFENAC SODIUM 75 MG PO TBEC: 75.0000 mg | DELAYED_RELEASE_TABLET | Freq: Two times a day (BID) | ORAL | 0 refills | Status: DC

## 2017-03-24 NOTE — Progress Notes (Signed)
   Subjective:    Patient ID: Molly Ryan, female    DOB: [birth year], 56 y.o.   MRN: [id number]  Knee Pain   The incident occurred more than 1 week ago. The pain is present in the right knee. The quality of the pain is described as aching and shooting. The symptoms are aggravated by weight bearing and movement. She has tried NSAIDs for the symptoms.   Moderate knee pain medial aspect right knee hurts with certain movements does not lock does not give way no known injury. 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  Review of Systems  Constitutional: Negative for fatigue and fever.  HENT: Negative for congestion.   Respiratory: Negative for cough.   Cardiovascular: Negative for chest pain.  Musculoskeletal: Positive for arthralgias.       Objective:   Physical Exam  Constitutional: She appears well-nourished. No distress.  Cardiovascular: Normal rate, regular rhythm and normal heart sounds.   No murmur heard. Pulmonary/Chest: Effort normal and breath sounds normal. No respiratory distress.  Musculoskeletal: She exhibits no edema.  Lymphadenopathy:    She has no cervical adenopathy.  Neurological: She is alert. She exhibits normal muscle tone.  Psychiatric: Her behavior is normal.  Vitals reviewed.   The knee is stable ligament stable tenderness medial aspect There does not appear to be any Shift in the ligaments are cartilage I do not feel x-rays indicated, if knee pain persists next several weeks referral to orthopedics for x-rays and injections     Assessment & Plan:  Knee pain Diclofenac twice daily over the next 2-3 weeks If not improving over the next few weeks notify us we will help set her up with orthopedics for injections  Patient has a sebaceous cyst on her scalp I've encouraged her to see general surgery for this patient will consider it she will let us know  She also has  a lipoma on her shoulder and on her back she will consider whether or not she once to see surgery  Finally follow-up for diabetes recommended somewhere within the course of next 2-3 months

## 2017-04-21 ENCOUNTER — Encounter: Payer: Self-pay | Admitting: Nurse Practitioner

## 2017-04-21 ENCOUNTER — Ambulatory Visit (INDEPENDENT_AMBULATORY_CARE_PROVIDER_SITE_OTHER): Payer: BLUE CROSS/BLUE SHIELD | Admitting: Nurse Practitioner

## 2017-04-21 VITALS — BP 122/84 | Ht 61.0 in | Wt 195.1 lb

## 2017-04-21 DIAGNOSIS — F5104 Psychophysiologic insomnia: Secondary | ICD-10-CM | POA: Diagnosis not present

## 2017-04-21 DIAGNOSIS — E559 Vitamin D deficiency, unspecified: Secondary | ICD-10-CM

## 2017-04-21 DIAGNOSIS — E119 Type 2 diabetes mellitus without complications: Secondary | ICD-10-CM

## 2017-04-21 DIAGNOSIS — M25561 Pain in right knee: Secondary | ICD-10-CM | POA: Diagnosis not present

## 2017-04-21 LAB — POCT GLYCOSYLATED HEMOGLOBIN (HGB A1C): Hemoglobin A1C: 9.3

## 2017-04-21 MED ORDER — ESZOPICLONE 2 MG PO TABS: ORAL_TABLET | ORAL | 5 refills | Status: DC

## 2017-04-21 NOTE — Patient Instructions (Signed)
Contour libre  Bydureon Trulicity Tanzeum/Victoza

## 2017-04-21 NOTE — Progress Notes (Signed)
Subjective:  Presents for recheck of her diabetes. Does not check her sugar outside of the office. Taking Metformin once daily in the evening. Cannot take it during the day since it makes her not feel well. Completed her vitamin D Rx but did not start OTC as recommended. Difficulty losing weight. No personal or family history of thyroid cancer. No personal history of pancreatitis. Limited activity especially due to persistent right knee pain (see previous note). Has been on long term Lunesta for insomnia. Defers colonoscopy or iFOBT testing.   Objective:   BP 122/84   Ht 5\' 1"  (1.549 m)   Wt 195 lb 2 oz (88.5 kg)   BMI 36.87 kg/m  NAD. Alert, oriented. Lungs clear. Heart RRR. LE: no edema. Right knee no crepitus or joint laxity. Distinct localized tenderness along the anterior medial aspect of the knee.  Results for orders placed or performed in visit on 04/21/17  POCT HgB A1C  Result Value Ref Range   Hemoglobin A1C 9.3      Assessment:   Problem List Items Addressed This Visit      Endocrine   Type 2 diabetes mellitus without complication, without long-term current use of insulin ([place]) - Primary   Relevant Orders   POCT HgB A1C (Completed)     Other   Chronic insomnia   Morbid obesity due to excess calories ([facility])   Vitamin D deficiency    Other Visit Diagnoses    Right medial knee pain           Plan:   Meds ordered this encounter  Medications  . eszopiclone (LUNESTA) 2 MG TABS tablet    Sig: TAKE 1 TABLET BY MOUTH IMMEDIATELY BEFORE BEDTIME    Dispense:  30 tablet    Refill:  5    Order Specific Question:   Supervising Provider    Answer:   Maggie Font   Strongly recommend she begin checking BS at least once per day at home. Consider new meter Libre. Discussed options for her BS. Strongly recommend another intervention at this time. Chooses to try new injectable; given names of current meds; she will check with her insurance to see which is preferred and  get back with [place].    Refer to orthopedics for evaluation of right knee pain.  Return in about 4 months (around 08/22/2017) for recheck.

## 2017-04-22 ENCOUNTER — Telehealth: Payer: Self-pay | Admitting: *Deleted

## 2017-04-22 NOTE — Telephone Encounter (Signed)
Left message to return call. Hoyle Sauer wanted to give pt the number to [facility] ortho specialist in [place] for her knee pain. Phone number is [phone]

## 2017-04-25 ENCOUNTER — Other Ambulatory Visit: Payer: Self-pay | Admitting: Nurse Practitioner

## 2017-04-25 MED ORDER — EXENATIDE ER 2 MG ~~LOC~~ PEN: PEN_INJECTOR | SUBCUTANEOUS | 2 refills | Status: DC

## 2017-04-25 NOTE — Telephone Encounter (Signed)
Spoke with patient and gave her number to [place] [place]. Patient states that she is ready to start to take a new diabetic medications. She states that the buyderon is fine with her. Please advise?

## 2017-04-25 NOTE — Telephone Encounter (Signed)
Spoke with patient and informed her per Linzie Collin- Bydueron was sent into pharmacy. Call us if any problems.Patient verbalized understanding.

## 2017-04-25 NOTE — Telephone Encounter (Signed)
Sent in. Call back if any problems.

## 2017-04-25 NOTE — Progress Notes (Signed)
Discussion at last visit; no family history of thyroid cancer or MEN; no personal history of pancreatitis

## 2017-04-25 NOTE — Telephone Encounter (Signed)
Left message to return call 

## 2017-04-28 ENCOUNTER — Telehealth: Payer: Self-pay | Admitting: Family Medicine

## 2017-04-28 MED ORDER — EXENATIDE ER 2 MG ~~LOC~~ PEN: PEN_INJECTOR | SUBCUTANEOUS | 2 refills | Status: DC

## 2017-04-28 NOTE — Telephone Encounter (Signed)
Patient said that her Rx for Exenatide was sent into the incorrect pharmacy on 04/25/17.  She is requesting this to be sent to [facility].  She would like to make note that this is her only pharmacy.

## 2017-04-28 NOTE — Telephone Encounter (Signed)
Prescription re-sent to pharmacy.

## 2017-05-15 ENCOUNTER — Other Ambulatory Visit: Payer: Self-pay | Admitting: Nurse Practitioner

## 2017-06-06 LAB — HM DIABETES EYE EXAM

## 2017-06-16 ENCOUNTER — Other Ambulatory Visit: Payer: Self-pay | Admitting: Nurse Practitioner

## 2017-06-17 NOTE — Telephone Encounter (Signed)
Last seen 04/21/17

## 2017-06-17 NOTE — Telephone Encounter (Signed)
May refill for 6 months 

## 2017-06-18 ENCOUNTER — Encounter: Payer: Self-pay | Admitting: *Deleted

## 2017-07-07 LAB — HM DIABETES EYE EXAM

## 2017-07-11 ENCOUNTER — Encounter: Payer: Self-pay | Admitting: *Deleted

## 2017-07-25 ENCOUNTER — Other Ambulatory Visit: Payer: Self-pay | Admitting: Nurse Practitioner

## 2017-08-22 ENCOUNTER — Encounter: Payer: Self-pay | Admitting: Nurse Practitioner

## 2017-08-22 ENCOUNTER — Ambulatory Visit: Payer: BLUE CROSS/BLUE SHIELD | Admitting: Nurse Practitioner

## 2017-08-22 VITALS — BP 128/82 | Ht 61.0 in | Wt 187.0 lb

## 2017-08-22 DIAGNOSIS — E119 Type 2 diabetes mellitus without complications: Secondary | ICD-10-CM | POA: Diagnosis not present

## 2017-08-22 DIAGNOSIS — F418 Other specified anxiety disorders: Secondary | ICD-10-CM

## 2017-08-22 DIAGNOSIS — F5104 Psychophysiologic insomnia: Secondary | ICD-10-CM

## 2017-08-22 DIAGNOSIS — Z23 Encounter for immunization: Secondary | ICD-10-CM

## 2017-08-22 LAB — POCT GLYCOSYLATED HEMOGLOBIN (HGB A1C): Hemoglobin A1C: 5.8

## 2017-08-22 MED ORDER — SITAGLIPTIN PHOSPHATE 25 MG PO TABS
25.0000 mg | ORAL_TABLET | Freq: Every day | ORAL | 2 refills | Status: DC
Start: 2017-08-22 — End: 2017-11-28

## 2017-08-22 NOTE — Patient Instructions (Addendum)
Trulicity

## 2017-08-26 ENCOUNTER — Encounter: Payer: Self-pay | Admitting: Nurse Practitioner

## 2017-08-26 NOTE — Progress Notes (Signed)
Subjective: Presents for routine follow-up of her chronic health issues.  Has been wearing compression stockings which has greatly helped her leg aches.  Stopped the Bydureon due to fatigue nausea and "did not feel good".  Has been off for about a month.  Has had her eye exam.  Has been on Lunesta for years, not working as well lately but has been switched to a generic brand.  Has been under more stress due to personal issues, taking an extra dose of Celexa on occasion, twice daily dosing.  Does this about twice a month.  Otherwise compliant with medications.  No chest pain/shortness of breath.  No TIA symptoms.  Objective:   BP 128/82   Ht 5\' 1"  (1.549 m)   Wt 187 lb (84.8 kg)   BMI 35.33 kg/m  NAD.  Alert, oriented.  Thoughts logical coherent and relevant.  Mildly depressed affect.  Dressed appropriately.  Making good eye contact.  Lungs clear.  Heart regular rate and rhythm.  Carotids no bruits or thrills.  Lower extremities no edema.  Results for orders placed or performed in visit on 08/22/17  POCT glycosylated hemoglobin (Hb A1C)  Result Value Ref Range   Hemoglobin A1C 5.8         Assessment:  Diabetes mellitus without complication ([place]) - Plan: POCT glycosylated hemoglobin (Hb A1C)  Depression with anxiety  Chronic insomnia  Need for vaccination - Plan: Flu Vaccine QUAD 36+ mos IM    Plan:   Meds ordered this encounter  Medications  . sitaGLIPtin (JANUVIA) 25 MG tablet    Sig: Take 1 tablet (25 mg total) daily by mouth.    Dispense:  30 tablet    Refill:  2    Order Specific Question:   Supervising Provider    Answer:   Mikey Kirschner [[id number]]   Trial of Januvia as directed.  Monitor blood sugars at home.  Call back if any problems.  Increase Celexa to 1-1/2 tabs p.o. daily for total of 30 mg daily.  Contact office to let us know how this dose is doing for her.  Discussed importance of weight loss healthy diet and regular activity.  Discussed importance of stress  reduction. 25 minutes was spent with the patient. Greater than half the time was spent in discussion and answering questions and counseling regarding the issues that the patient came in for today. Return in about 4 months (around 12/20/2017) for recheck and labs. Call back sooner if any problems.

## 2017-09-30 ENCOUNTER — Other Ambulatory Visit: Payer: Self-pay | Admitting: Family Medicine

## 2017-10-28 ENCOUNTER — Other Ambulatory Visit: Payer: Self-pay | Admitting: Family Medicine

## 2017-11-17 ENCOUNTER — Other Ambulatory Visit: Payer: Self-pay | Admitting: Nurse Practitioner

## 2017-11-28 ENCOUNTER — Other Ambulatory Visit: Payer: Self-pay | Admitting: Nurse Practitioner

## 2017-12-18 ENCOUNTER — Other Ambulatory Visit: Payer: Self-pay | Admitting: Nurse Practitioner

## 2017-12-19 ENCOUNTER — Ambulatory Visit: Payer: BLUE CROSS/BLUE SHIELD | Admitting: Nurse Practitioner

## 2017-12-23 ENCOUNTER — Other Ambulatory Visit: Payer: Self-pay

## 2017-12-23 ENCOUNTER — Emergency Department (HOSPITAL_COMMUNITY): Payer: 59

## 2017-12-23 ENCOUNTER — Encounter (HOSPITAL_COMMUNITY): Payer: Self-pay

## 2017-12-23 ENCOUNTER — Observation Stay (HOSPITAL_COMMUNITY)
Admission: EM | Admit: 2017-12-23 | Discharge: 2017-12-26 | Disposition: A | Discharge status: Home / Self Care | Payer: 59 | Attending: Internal Medicine | Admitting: Internal Medicine

## 2017-12-23 DIAGNOSIS — Z7984 Long term (current) use of oral hypoglycemic drugs: Secondary | ICD-10-CM | POA: Diagnosis not present

## 2017-12-23 DIAGNOSIS — Z885 Allergy status to narcotic agent status: Secondary | ICD-10-CM | POA: Diagnosis not present

## 2017-12-23 DIAGNOSIS — K5732 Diverticulitis of large intestine without perforation or abscess without bleeding: Principal | ICD-10-CM | POA: Insufficient documentation

## 2017-12-23 DIAGNOSIS — K5792 Diverticulitis of intestine, part unspecified, without perforation or abscess without bleeding: Secondary | ICD-10-CM | POA: Diagnosis present

## 2017-12-23 DIAGNOSIS — Z9049 Acquired absence of other specified parts of digestive tract: Secondary | ICD-10-CM | POA: Insufficient documentation

## 2017-12-23 DIAGNOSIS — Z79899 Other long term (current) drug therapy: Secondary | ICD-10-CM | POA: Diagnosis not present

## 2017-12-23 DIAGNOSIS — Z880 Allergy status to penicillin: Secondary | ICD-10-CM | POA: Diagnosis not present

## 2017-12-23 DIAGNOSIS — Z90710 Acquired absence of both cervix and uterus: Secondary | ICD-10-CM | POA: Diagnosis not present

## 2017-12-23 DIAGNOSIS — Z7982 Long term (current) use of aspirin: Secondary | ICD-10-CM | POA: Insufficient documentation

## 2017-12-23 DIAGNOSIS — I10 Essential (primary) hypertension: Secondary | ICD-10-CM | POA: Insufficient documentation

## 2017-12-23 DIAGNOSIS — E119 Type 2 diabetes mellitus without complications: Secondary | ICD-10-CM

## 2017-12-23 LAB — CBC WITH DIFFERENTIAL/PLATELET
Basophils Absolute: 0 K/uL (ref 0.0–0.1)
Basophils Relative: 0 %
Eosinophils Absolute: 0.1 K/uL (ref 0.0–0.7)
Eosinophils Relative: 0 %
HCT: 42.7 % (ref 36.0–46.0)
Hemoglobin: 14.1 g/dL (ref 12.0–15.0)
Lymphocytes Relative: 9 %
Lymphs Abs: 1.3 K/uL (ref 0.7–4.0)
MCH: 28.2 pg (ref 26.0–34.0)
MCHC: 33 g/dL (ref 30.0–36.0)
MCV: 85.4 fL (ref 78.0–100.0)
Monocytes Absolute: 1.1 K/uL — ABNORMAL HIGH (ref 0.1–1.0)
Monocytes Relative: 8 %
Neutro Abs: 11.9 K/uL — ABNORMAL HIGH (ref 1.7–7.7)
Neutrophils Relative %: 83 %
Platelets: 314 K/uL (ref 150–400)
RBC: 5 MIL/uL (ref 3.87–5.11)
RDW: 12.7 % (ref 11.5–15.5)
WBC: 14.3 K/uL — ABNORMAL HIGH (ref 4.0–10.5)

## 2017-12-23 LAB — COMPREHENSIVE METABOLIC PANEL
ALT: 24 U/L (ref 14–54)
AST: 14 U/L — ABNORMAL LOW (ref 15–41)
Albumin: 4 g/dL (ref 3.5–5.0)
Alkaline Phosphatase: 65 U/L (ref 38–126)
Anion gap: 14 (ref 5–15)
BUN: 8 mg/dL (ref 6–20)
CO2: 22 mmol/L (ref 22–32)
Calcium: 9.3 mg/dL (ref 8.9–10.3)
Chloride: 101 mmol/L (ref 101–111)
Creatinine, Ser: 0.55 mg/dL (ref 0.44–1.00)
GFR calc Af Amer: >60 mL/min (ref >60)
GFR calc non Af Amer: >60 mL/min (ref >60)
Glucose, Bld: 200 mg/dL — ABNORMAL HIGH (ref 65–99)
Potassium: 3.6 mmol/L (ref 3.5–5.1)
Sodium: 137 mmol/L (ref 135–145)
Total Bilirubin: 1.3 mg/dL — ABNORMAL HIGH (ref 0.3–1.2)
Total Protein: 7.7 g/dL (ref 6.5–8.1)

## 2017-12-23 LAB — URINALYSIS, ROUTINE W REFLEX MICROSCOPIC
Bilirubin Urine: NEGATIVE
Glucose, UA: 150 mg/dL — AB
Hgb urine dipstick: NEGATIVE
Ketones, ur: 5 mg/dL — AB
Leukocytes, UA: NEGATIVE
Nitrite: NEGATIVE
Protein, ur: NEGATIVE mg/dL
Specific Gravity, Urine: >1.046 — ABNORMAL HIGH (ref 1.005–1.030)
pH: 6 (ref 5.0–8.0)

## 2017-12-23 LAB — GLUCOSE, CAPILLARY
Glucose-Capillary: 190 mg/dL — ABNORMAL HIGH (ref 65–99)
Glucose-Capillary: 180 mg/dL — ABNORMAL HIGH (ref 65–99)

## 2017-12-23 LAB — LIPASE, BLOOD: Lipase: 32 U/L (ref 11–51)

## 2017-12-23 IMAGING — CT CT ABD-PELV W/ CM
2 of 5 series · 16 of 46 positions shown, 18 images · IV contrast (Isovue)
Comparison: [DATE]

CLINICAL DATA: Lower pelvic pain x9 days, dysuria. Prior
cholecystectomy, hysterectomy, and appendectomy

EXAM:
CT ABDOMEN AND PELVIS WITH CONTRAST
TECHNIQUE: Multidetector CT imaging of the abdomen and pelvis was performed
using the standard protocol following bolus administration of
intravenous contrast.
CONTRAST:  100mL [5M] IOPAMIDOL ([5M]) INJECTION 61%

[Series 2: axial st · axial · 0.78mm/px · z∈[-428,-23]mm · 13 of 93 slices shown, 15 images]
[im 6/93  soft-tissue]
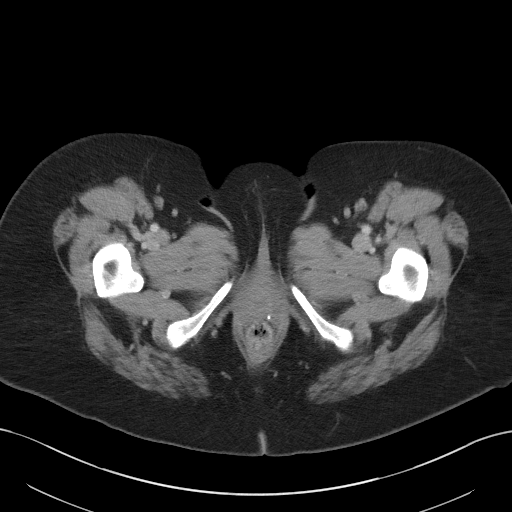
[im 6/93  bone]
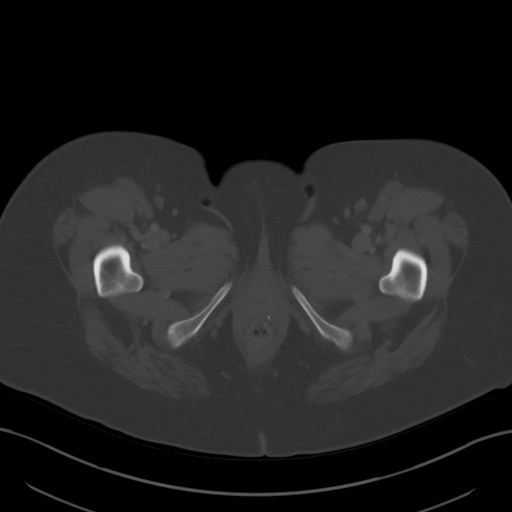
[im 11/93  soft-tissue]
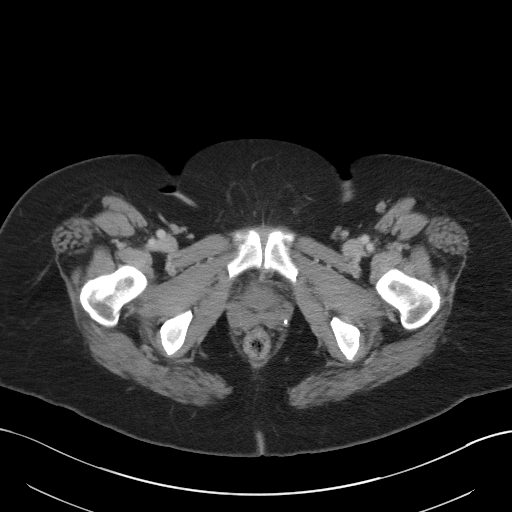
[im 22/93  soft-tissue]
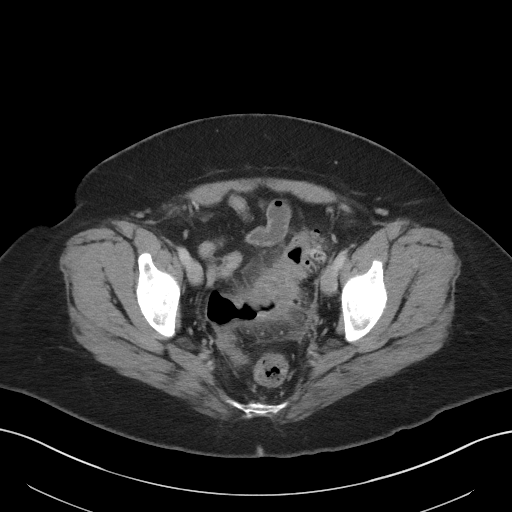
[im 28/93  soft-tissue]
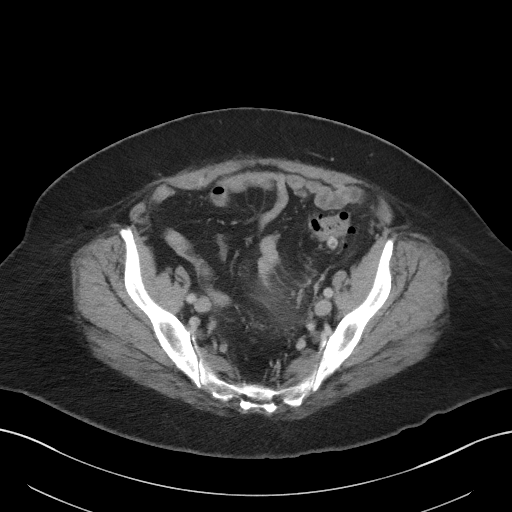
[im 33/93  soft-tissue]
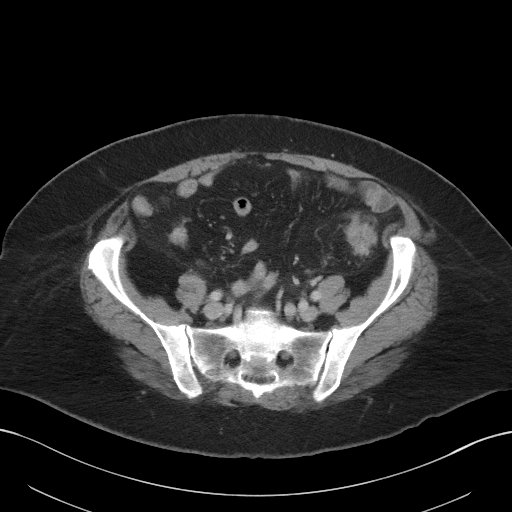
[im 38/93  soft-tissue]
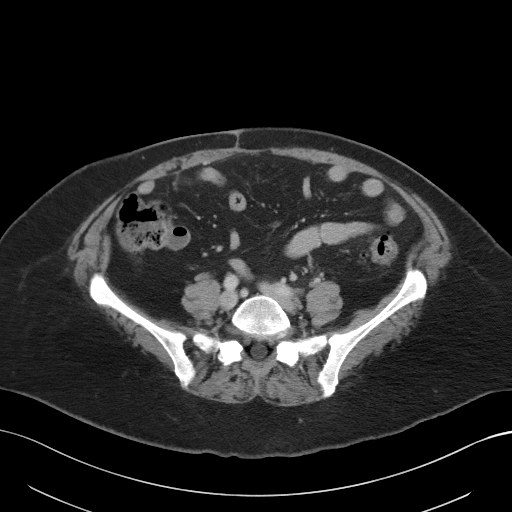
[im 49/93  soft-tissue]
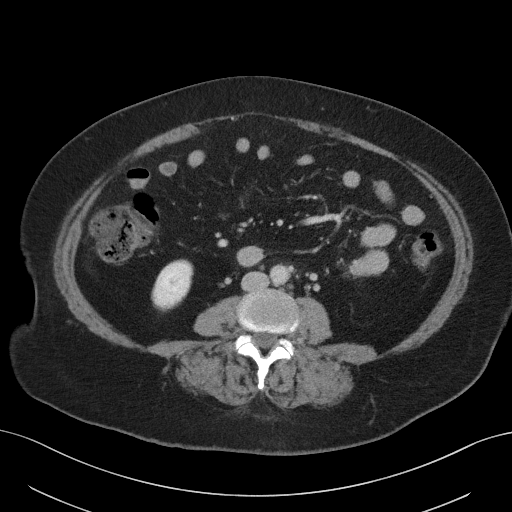
[im 55/93  soft-tissue]
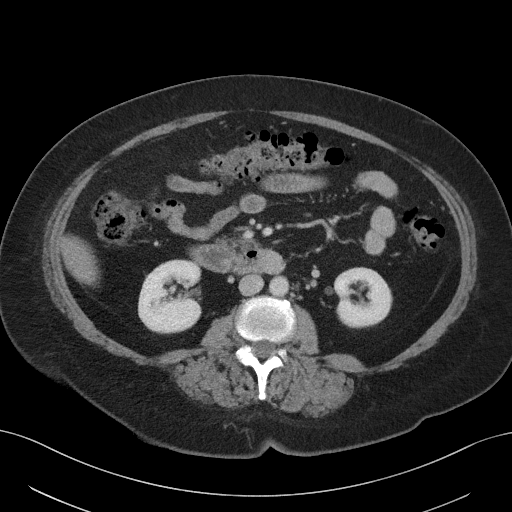
[im 60/93  soft-tissue]
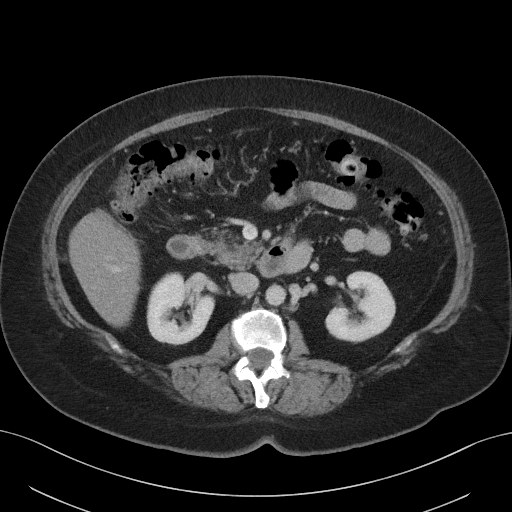
[im 60/93  bone]
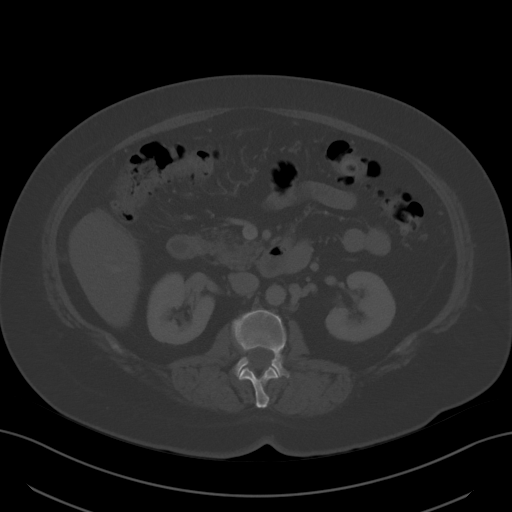
[im 65/93  soft-tissue]
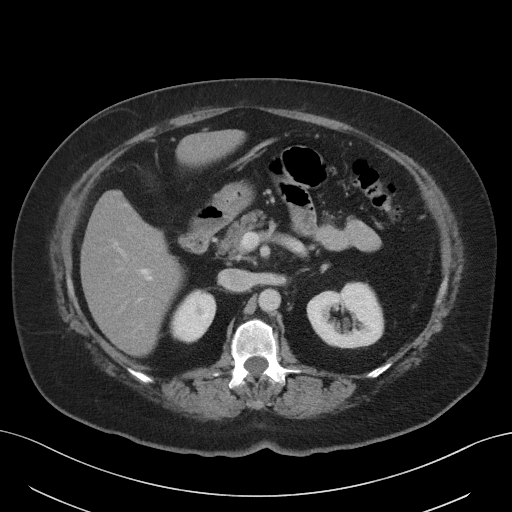
[im 71/93  soft-tissue]
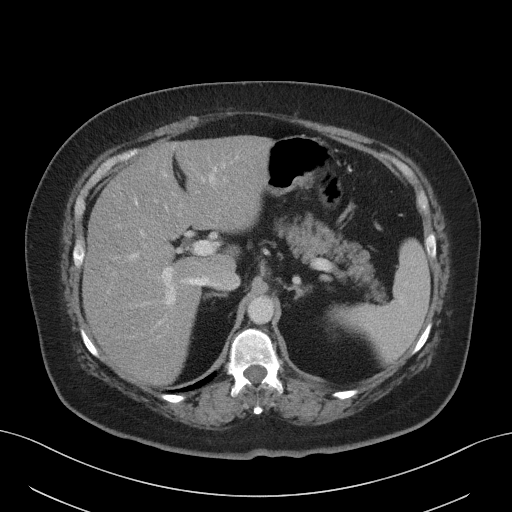
[im 82/93  soft-tissue]
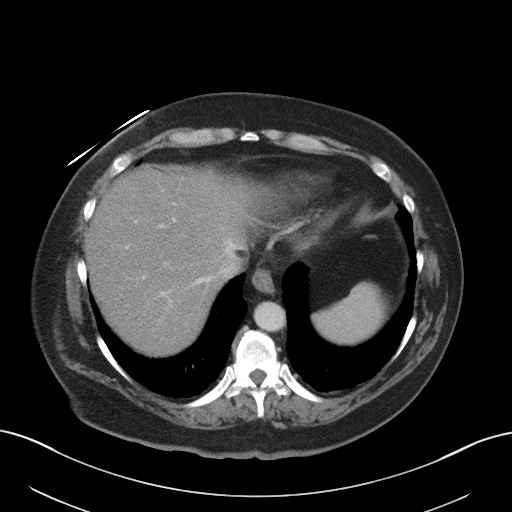
[im 87/93  soft-tissue]
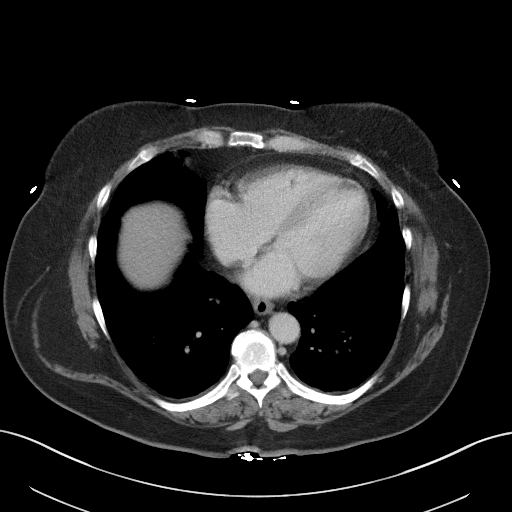

[Series 6: coronal st · coronal · 0.80mm/px · 3 of 116 slices shown]
[im 39/116  soft-tissue]
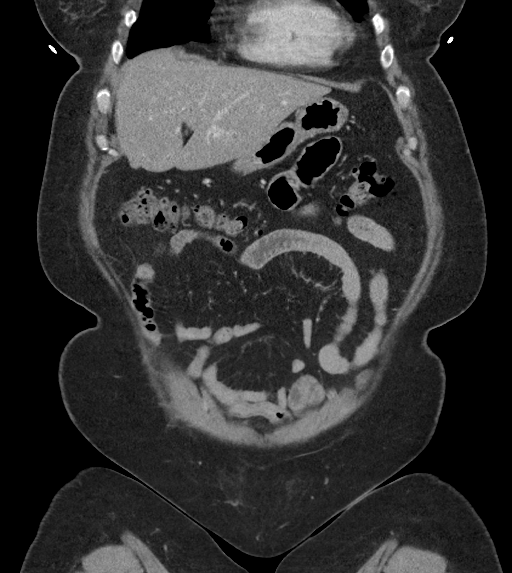
[im 52/116  soft-tissue]
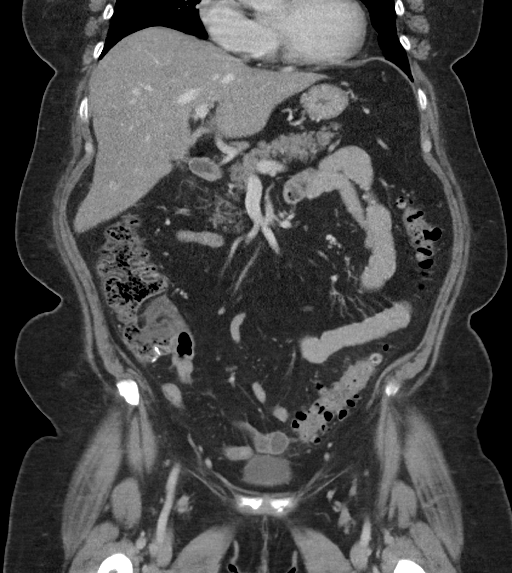
[im 64/116  soft-tissue]
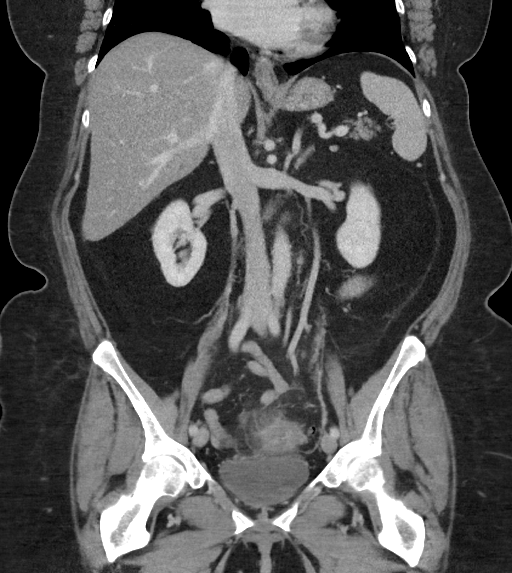

[16 of 46 positions shown; findings below may reference images not displayed]

FINDINGS: Lower chest: Lung bases are clear.

Hepatobiliary: 2.1 cm cyst in segment 2.

Status post cholecystectomy. No intrahepatic or extrahepatic ductal
dilatation.

Pancreas: Within normal limits.

Spleen: Within normal limits.

Adrenals/Urinary Tract: Adrenal glands are within normal limits.

Kidneys are within normal limits.  No hydronephrosis.

Bladder is within normal limits.

Stomach/Bowel: Stomach is within normal limits.

No evidence of bowel obstruction.

Prior appendectomy.

Left colonic diverticulosis with acute sigmoid diverticulitis in the
lower pelvis (series 2/image 71). Pericolonic inflammatory
changes/stranding. No drainable fluid collection/abscess. No free
air to suggest macroscopic perforation.

Additional mild diverticulitis involving the proximal sigmoid in the
left lower abdomen (series 2/image 62).

Vascular/Lymphatic: No evidence of abdominal aortic aneurysm.

No suspicious abdominopelvic lymphadenopathy.

Reproductive: Status post hysterectomy.

No adnexal masses.

Other: Trace pelvic fluid.

Musculoskeletal: Mild degenerative changes of the lower thoracic
spine.
IMPRESSION: Acute sigmoid diverticulitis.

No drainable fluid collection/abscess.  No free air.

## 2017-12-23 MED ORDER — HYDROMORPHONE HCL 1 MG/ML IJ SOLN
0.5000 mg | Freq: Once | INTRAMUSCULAR | Status: AC
  Administered 2017-12-23: 0.5 mg via INTRAVENOUS
  Filled 2017-12-23: qty 0.5

## 2017-12-23 MED ORDER — IOPAMIDOL (ISOVUE-300) INJECTION 61%
100.0000 mL | Freq: Once | INTRAVENOUS | Status: AC | PRN
  Administered 2017-12-23: 100 mL via INTRAVENOUS

## 2017-12-23 MED ORDER — INSULIN ASPART 100 UNIT/ML ~~LOC~~ SOLN
0.0000 [IU] | Freq: Three times a day (TID) | SUBCUTANEOUS | Status: DC
  Administered 2017-12-23 – 2017-12-24 (×3): 3 [IU] via SUBCUTANEOUS
  Administered 2017-12-25: 5 [IU] via SUBCUTANEOUS
  Administered 2017-12-25: 2 [IU] via SUBCUTANEOUS
  Administered 2017-12-25: 3 [IU] via SUBCUTANEOUS
  Administered 2017-12-26: 5 [IU] via SUBCUTANEOUS
  Administered 2017-12-26: 3 [IU] via SUBCUTANEOUS

## 2017-12-23 MED ORDER — HYDROMORPHONE HCL 1 MG/ML IJ SOLN
0.5000 mg | INTRAMUSCULAR | Status: DC | PRN
  Administered 2017-12-23 – 2017-12-24 (×4): 0.5 mg via INTRAVENOUS
  Filled 2017-12-23 (×4): qty 0.5

## 2017-12-23 MED ORDER — CITALOPRAM HYDROBROMIDE 20 MG PO TABS
20.0000 mg | ORAL_TABLET | Freq: Every day | ORAL | Status: DC
  Administered 2017-12-23 – 2017-12-25 (×3): 20 mg via ORAL
  Filled 2017-12-23 (×3): qty 1

## 2017-12-23 MED ORDER — CIPROFLOXACIN IN D5W 400 MG/200ML IV SOLN
400.0000 mg | Freq: Two times a day (BID) | INTRAVENOUS | Status: DC
  Administered 2017-12-23 – 2017-12-26 (×6): 400 mg via INTRAVENOUS
  Filled 2017-12-23 (×6): qty 200

## 2017-12-23 MED ORDER — DIPHENHYDRAMINE HCL 50 MG/ML IJ SOLN
12.5000 mg | Freq: Once | INTRAMUSCULAR | Status: AC
  Administered 2017-12-23: 12.5 mg via INTRAVENOUS
  Filled 2017-12-23: qty 1

## 2017-12-23 MED ORDER — OXYCODONE-ACETAMINOPHEN 5-325 MG PO TABS
1.0000 | ORAL_TABLET | ORAL | Status: DC | PRN
  Administered 2017-12-23 – 2017-12-24 (×2): 1 via ORAL
  Filled 2017-12-23: qty 2
  Filled 2017-12-23: qty 1

## 2017-12-23 MED ORDER — METRONIDAZOLE IN NACL 5-0.79 MG/ML-% IV SOLN
500.0000 mg | Freq: Once | INTRAVENOUS | Status: AC
  Administered 2017-12-23: 500 mg via INTRAVENOUS
  Filled 2017-12-23: qty 100

## 2017-12-23 MED ORDER — HYDROMORPHONE HCL 1 MG/ML IJ SOLN
0.5000 mg | Freq: Once | INTRAMUSCULAR | Status: AC
  Administered 2017-12-23: 0.5 mg via INTRAVENOUS
  Filled 2017-12-23: qty 1

## 2017-12-23 MED ORDER — ONDANSETRON HCL 4 MG/2ML IJ SOLN
4.0000 mg | Freq: Once | INTRAMUSCULAR | Status: AC
  Administered 2017-12-23: 4 mg via INTRAVENOUS
  Filled 2017-12-23: qty 2

## 2017-12-23 MED ORDER — ONDANSETRON HCL 4 MG/2ML IJ SOLN
4.0000 mg | Freq: Four times a day (QID) | INTRAMUSCULAR | Status: DC | PRN
  Administered 2017-12-25: 4 mg via INTRAVENOUS
  Filled 2017-12-23: qty 2

## 2017-12-23 MED ORDER — POTASSIUM CHLORIDE CRYS ER 20 MEQ PO TBCR
40.0000 meq | EXTENDED_RELEASE_TABLET | Freq: Once | ORAL | Status: AC
  Administered 2017-12-23: 40 meq via ORAL
  Filled 2017-12-23: qty 2

## 2017-12-23 MED ORDER — SODIUM CHLORIDE 0.9 % IV SOLN
1.0000 g | Freq: Once | INTRAVENOUS | Status: AC
  Administered 2017-12-23: 1 g via INTRAVENOUS
  Filled 2017-12-23: qty 10

## 2017-12-23 MED ORDER — SODIUM CHLORIDE 0.9 % IV SOLN
INTRAVENOUS | Status: DC
  Administered 2017-12-23 – 2017-12-26 (×4): via INTRAVENOUS

## 2017-12-23 MED ORDER — ZOLPIDEM TARTRATE 5 MG PO TABS
5.0000 mg | ORAL_TABLET | Freq: Every evening | ORAL | Status: DC | PRN
  Administered 2017-12-24 – 2017-12-25 (×2): 5 mg via ORAL
  Filled 2017-12-23 (×2): qty 1

## 2017-12-23 MED ORDER — METRONIDAZOLE IN NACL 5-0.79 MG/ML-% IV SOLN
500.0000 mg | Freq: Three times a day (TID) | INTRAVENOUS | Status: DC
  Administered 2017-12-23 – 2017-12-26 (×9): 500 mg via INTRAVENOUS
  Filled 2017-12-23 (×9): qty 100

## 2017-12-23 MED ORDER — ACETAMINOPHEN 325 MG PO TABS
650.0000 mg | ORAL_TABLET | Freq: Four times a day (QID) | ORAL | Status: DC | PRN
  Administered 2017-12-23 – 2017-12-25 (×3): 650 mg via ORAL
  Filled 2017-12-23 (×3): qty 2

## 2017-12-23 MED ORDER — INSULIN ASPART 100 UNIT/ML ~~LOC~~ SOLN: 0.0000 [IU] | Freq: Every day | SUBCUTANEOUS | Status: DC

## 2017-12-23 MED ORDER — ONDANSETRON HCL 4 MG PO TABS: 4.0000 mg | ORAL_TABLET | Freq: Four times a day (QID) | ORAL | Status: DC | PRN

## 2017-12-23 MED ORDER — ACETAMINOPHEN 650 MG RE SUPP: 650.0000 mg | Freq: Four times a day (QID) | RECTAL | Status: DC | PRN

## 2017-12-23 NOTE — ED Provider Notes (Signed)
[facility] EMERGENCY DEPARTMENT Provider Note   CSN: [id number] Arrival date & time: 12/23/17  0801     History   Chief Complaint Chief Complaint  Patient presents with  . Abdominal Pain    HPI Molly Ryan is a 57 y.o. female.  The history is provided by the patient.  Abdominal Pain   This is a new problem. The current episode started yesterday. The problem occurs constantly. The problem has been gradually worsening. The pain is associated with an unknown factor. The pain is located in the LLQ and RLQ. The pain is at a severity of 9/10. Associated symptoms include dysuria and frequency. Pertinent negatives include fever, belching, diarrhea, flatus, hematochezia, nausea, vomiting, constipation, hematuria, headaches, arthralgias and myalgias. Exacerbated by: standing, sitting, eating, urinating. Nothing relieves the symptoms. Past medical history comments: adhesions, DM, diverticulitis.    Past Medical History:  Diagnosis Date  . Chronic insomnia   . Migraine headache   . Reflux     Patient Active Problem List   Diagnosis Date Noted  . Vitamin D deficiency 11/18/2016  . Type 2 diabetes mellitus without complication, without long-term current use of insulin ([place]) 09/14/2015  . Morbid obesity due to excess calories ([place]) 09/11/2015  . Diverticulitis of colon without hemorrhage 04/02/2015  . Migraine headache with aura 07/15/2013  . Chronic insomnia 07/15/2013  . Depression with anxiety 07/15/2013  . Migraines 03/18/2013    Past Surgical History:  Procedure Laterality Date  . APPENDECTOMY    . CHOLECYSTECTOMY    . LAPAROSCOPIC BILATERAL SALPINGO OOPHERECTOMY Bilateral 2014  . VAGINAL HYSTERECTOMY  2006    OB History    No data available       Home Medications    Prior to Admission medications   Medication Sig Start Date End Date Taking? Authorizing Provider  aspirin-acetaminophen-caffeine (EXCEDRIN MIGRAINE) [id number] MG per tablet Take 2 tablets by  mouth every 6 (six) hours as needed for pain. Reported on 01/29/2016    [provider]  citalopram (CELEXA) 20 MG tablet TAKE 1 TABLET BY MOUTH DAILY 12/19/17   Nilda Simmer, NP  eszopiclone (LUNESTA) 2 MG TABS tablet TAKE 1 TABLET BY MOUTH EVERY NIGHT AT BEDTIME 11/17/17   Nilda Simmer, NP  JANUVIA 25 MG tablet TAKE 1 TABLET(25 MG) BY MOUTH DAILY 11/28/17   Nilda Simmer, NP  metFORMIN (GLUCOPHAGE) 500 MG tablet TAKE 1/2 TABLET BY MOUTH TWICE DAILY 10/29/17   Kathyrn Drown, MD  rizatriptan (MAXALT-MLT) 10 MG disintegrating tablet Take 1 tablet (10 mg total) by mouth as needed for migraine. May repeat in 2 hours if needed Patient not taking: Reported on 03/24/2017 11/15/16   Nilda Simmer, NP    Family History Family History  Problem Relation Age of Onset  . Hypertension Brother   . Cancer Brother        lung  . Cancer Mother        lung  . COPD Mother   . Diabetes Maternal Grandmother     Social History Social History   Tobacco Use  . Smoking status: Never Smoker  . Smokeless tobacco: Never Used  Substance Use Topics  . Alcohol use: Yes    Comment: occassionally  . Drug use: No     Allergies   Morphine and related and Amoxicillin   Review of Systems Review of Systems  Constitutional: Positive for appetite change and fatigue. Negative for activity change and fever.       All  ROS Neg except as noted in HPI  HENT: Negative for nosebleeds.   Eyes: Negative for photophobia and discharge.  Respiratory: Negative for cough, shortness of breath and wheezing.   Cardiovascular: Negative for chest pain and palpitations.  Gastrointestinal: Positive for abdominal pain. Negative for blood in stool, constipation, diarrhea, flatus, hematochezia, nausea and vomiting.  Genitourinary: Positive for dysuria and frequency. Negative for hematuria.  Musculoskeletal: Negative for arthralgias, back pain, myalgias and neck pain.  Skin: Negative.   Neurological: Negative  for dizziness, seizures, speech difficulty and headaches.  Psychiatric/Behavioral: Negative for confusion and hallucinations.     Physical Exam Updated Vital Signs BP (!) 147/89 (BP Location: Left Arm)   Pulse 67   Temp 99.1 F (37.3 C) (Oral)   Resp 20   Ht 5' (1.524 m)   Wt 86.2 kg (190 lb)   SpO2 98%   BMI 37.11 kg/m   Physical Exam  Constitutional: She is oriented to person, place, and time. She appears well-developed and well-nourished.  Non-toxic appearance.  HENT:  Head: Normocephalic.  Right Ear: Tympanic membrane and external ear normal.  Left Ear: Tympanic membrane and external ear normal.  Eyes: EOM and lids are normal. Pupils are equal, round, and reactive to light.  Neck: Normal range of motion. Neck supple. Carotid bruit is not present.  Cardiovascular: Normal rate, regular rhythm, normal heart sounds, intact distal pulses and normal pulses.  Pulmonary/Chest: Breath sounds normal. No respiratory distress.  Abdominal: Soft. Bowel sounds are normal. There is tenderness in the right upper quadrant, right lower quadrant, left upper quadrant and left lower quadrant. There is guarding.  No CVA tenderness noted.  Musculoskeletal: Normal range of motion.  Lymphadenopathy:       Head (right side): No submandibular adenopathy present.       Head (left side): No submandibular adenopathy present.    She has no cervical adenopathy.  Neurological: She is alert and oriented to person, place, and time. She has normal strength. No cranial nerve deficit or sensory deficit.  Skin: Skin is warm and dry.  Psychiatric: She has a normal mood and affect. Her speech is normal.  Nursing note and vitals reviewed.    ED Treatments / Results  Labs (all labs ordered are listed, but only abnormal results are displayed) Labs Reviewed  CBC WITH DIFFERENTIAL/PLATELET  COMPREHENSIVE METABOLIC PANEL  URINALYSIS, ROUTINE W REFLEX MICROSCOPIC    EKG  EKG Interpretation None        Radiology No results found.  Procedures Procedures (including critical care time)  Medications Ordered in ED Medications - No data to display   Initial Impression / Assessment and Plan / ED Course  I have reviewed the triage vital signs and the nursing notes.  Pertinent labs & imaging results that were available during my care of the patient were reviewed by me and considered in my medical decision making (see chart for details).      Case discussed and reviewed by Dr Roderic Palau. Final Clinical Impressions(s) / ED Diagnoses MDM Patient presents with diffuse abdominal pain.  She complains of pain mostly in the right and left lower quadrant, however examination shows problems with pain in the right and left upper quadrant as well as the right and left lower quadrant.  Will evaluate patient for kidney stones, diverticulitis, abscess, urinary tract infection, appendicitis, or surgical abdomen.  The complete blood count shows the white blood cells to be elevated at 14,300.  The comprehensive metabolic panel shows the glucose to  be elevated at 200 and the total bilirubin is elevated at 1.3, otherwise within normal limits. Urine analysis shows a specific gravity of greater than 1.046, otherwise negative.  CT scan of the abdomen reveals left colonic diverticulosis with acute sigmoid diverticulitis in the lower pelvis.  There is pericolonic inflammatory changes with stranding present.  No abscess appreciated.  No free air appreciated.  There is additional mild diverticulitis involving the proximal sigmoid and the left lower abdomen. I discussed the findings of my examination as well as the findings of the laboratory and imaging studies with the patient and her significant other in terms which they understand.  Questions were answered.  Patient will be started on Flagyl and IV Rocephin.  Patient treated with IV Dilaudid for pain.  Patient discussed with [facility] hospitalist.  They will see the  patient for admission.   Final diagnoses:  Acute diverticulitis of intestine    ED Discharge Orders    None       Lily Kocher, PA-C 12/23/17 1144    Milton Ferguson, MD 12/23/17 1544

## 2017-12-23 NOTE — H&P (Signed)
History and Physical    Molly Ryan:[id number] DOB: [birth year] DOA: 12/23/2017  PCP: Kathyrn Drown, MD  Patient coming from: home  I have personally briefly reviewed patient's old medical records in [place]  Chief Complaint: abdominal pain  HPI: Molly Ryan is a 57 y.o. female with medical history significant of diabetes not on insulin, presents to hospital with complaints of abdominal pain.  Patient reports onset of diffuse abdominal pain, mostly in her right and left lower quadrants beginning yesterday.  Nonradiating.  No fever.  No vomiting.  She had normal bowel movement yesterday.  No diarrhea.  No sick contacts.  No shortness of breath, chest pain, cough.  She was evaluated in the emergency room where a CT scan indicated sigmoid diverticulitis.  She required several doses of IV pain medicine and reports continued pain.  She is been referred for admission.  Review of Systems: As per HPI otherwise 10 point review of systems negative.   Past Medical History:  Diagnosis Date  . Chronic insomnia   . Migraine headache   . Reflux     Past Surgical History:  Procedure Laterality Date  . APPENDECTOMY    . CHOLECYSTECTOMY    . LAPAROSCOPIC BILATERAL SALPINGO OOPHERECTOMY Bilateral 2014  . VAGINAL HYSTERECTOMY  2006     reports that  has never smoked. she has never used smokeless tobacco. She reports that she drinks alcohol. She reports that she does not use drugs.  Allergies  Allergen Reactions  . Morphine And Related     Muscle spasms  . Amoxicillin Rash    Family History  Problem Relation Age of Onset  . Hypertension Brother   . Cancer Brother        lung  . Cancer Mother        lung  . COPD Mother   . Diabetes Maternal Grandmother     Prior to Admission medications   Medication Sig Start Date End Date Taking? Authorizing Provider  aspirin-acetaminophen-caffeine (EXCEDRIN MIGRAINE) [phone] MG per tablet Take 2 tablets by mouth every 6  (six) hours as needed for pain. Reported on 01/29/2016   Yes [provider]  citalopram (CELEXA) 20 MG tablet TAKE 1 TABLET BY MOUTH DAILY 12/19/17  Yes Nilda Simmer, NP  eszopiclone (LUNESTA) 2 MG TABS tablet TAKE 1 TABLET BY MOUTH EVERY NIGHT AT BEDTIME 11/17/17  Yes Nilda Simmer, NP  JANUVIA 25 MG tablet TAKE 1 TABLET(25 MG) BY MOUTH DAILY 11/28/17  Yes Pearson Forster C, NP  metFORMIN (GLUCOPHAGE) 500 MG tablet TAKE 1/2 TABLET BY MOUTH TWICE DAILY 10/29/17  Yes Kathyrn Drown, MD    Physical Exam: Vitals:   12/23/17 1045 12/23/17 1046 12/23/17 1100 12/23/17 1152  BP: 127/79  112/79 131/70  Pulse:  71 68 (!) 55  Resp:      Temp:    99 F (37.2 C)  TempSrc:      SpO2:  96% 92% 98%  Weight:      Height:        Constitutional: NAD, calm, comfortable Vitals:   12/23/17 1045 12/23/17 1046 12/23/17 1100 12/23/17 1152  BP: 127/79  112/79 131/70  Pulse:  71 68 (!) 55  Resp:      Temp:    99 F (37.2 C)  TempSrc:      SpO2:  96% 92% 98%  Weight:      Height:       Eyes: PERRL, lids and conjunctivae  normal ENMT: Mucous membranes are moist. Posterior pharynx clear of any exudate or lesions.Normal dentition.  Neck: normal, supple, no masses, no thyromegaly Respiratory: clear to auscultation bilaterally, no wheezing, no crackles. Normal respiratory effort. No accessory muscle use.  Cardiovascular: Regular rate and rhythm, no murmurs / rubs / gallops. No extremity edema. 2+ pedal pulses. No carotid bruits.  Abdomen: diffuse tenderness, more in the LLQ. No hepatosplenomegaly. Bowel sounds positive.  Musculoskeletal: no clubbing / cyanosis. No joint deformity upper and lower extremities. Good ROM, no contractures. Normal muscle tone.  Skin: no rashes, lesions, ulcers. No induration Neurologic: CN 2-12 grossly intact. Sensation intact, DTR normal. Strength 5/5 in all 4.  Psychiatric: Normal judgment and insight. Alert and oriented x 3. Normal mood.   Labs on Admission:  I have personally reviewed following labs and imaging studies  CBC: Recent Labs  Lab 12/23/17 0821  WBC 14.3*  NEUTROABS 11.9*  HGB 14.1  HCT 42.7  MCV 85.4  PLT 161   Basic Metabolic Panel: Recent Labs  Lab 12/23/17 0821  NA 137  K 3.6  CL 101  CO2 22  GLUCOSE 200*  BUN 8  CREATININE 0.55  CALCIUM 9.3   GFR: Estimated Creatinine Clearance: 76.6 mL/min (by C-G formula based on SCr of 0.55 mg/dL). Liver Function Tests: Recent Labs  Lab 12/23/17 0821  AST 14*  ALT 24  ALKPHOS 65  BILITOT 1.3*  PROT 7.7  ALBUMIN 4.0   Recent Labs  Lab 12/23/17 0821  LIPASE 32   No results for input(s): AMMONIA in the last 168 hours. Coagulation Profile: No results for input(s): INR, PROTIME in the last 168 hours. Cardiac Enzymes: No results for input(s): CKTOTAL, CKMB, CKMBINDEX, TROPONINI in the last 168 hours. BNP (last 3 results) No results for input(s): PROBNP in the last 8760 hours. HbA1C: No results for input(s): HGBA1C in the last 72 hours. CBG: No results for input(s): GLUCAP in the last 168 hours. Lipid Profile: No results for input(s): CHOL, HDL, LDLCALC, TRIG, CHOLHDL, LDLDIRECT in the last 72 hours. Thyroid Function Tests: No results for input(s): TSH, T4TOTAL, FREET4, T3FREE, THYROIDAB in the last 72 hours. Anemia Panel: No results for input(s): VITAMINB12, FOLATE, FERRITIN, TIBC, IRON, RETICCTPCT in the last 72 hours. Urine analysis:    Component Value Date/Time   COLORURINE YELLOW 12/23/2017 0816   APPEARANCEUR CLEAR 12/23/2017 0816   LABSPEC >1.046 (H) 12/23/2017 0816   PHURINE 6.0 12/23/2017 0816   GLUCOSEU 150 (A) 12/23/2017 0816   HGBUR NEGATIVE 12/23/2017 0816   BILIRUBINUR NEGATIVE 12/23/2017 0816   BILIRUBINUR ++ 03/18/2013 1447   KETONESUR 5 (A) 12/23/2017 0816   PROTEINUR NEGATIVE 12/23/2017 0816   UROBILINOGEN 0.2 04/22/2013 0342   NITRITE NEGATIVE 12/23/2017 0816   LEUKOCYTESUR NEGATIVE 12/23/2017 0816    Radiological Exams on  Admission: Ct Abdomen Pelvis W Contrast  Result Date: 12/23/2017 CLINICAL DATA:  Lower pelvic pain x9 days, dysuria. Prior cholecystectomy, hysterectomy, and appendectomy EXAM: CT ABDOMEN AND PELVIS WITH CONTRAST TECHNIQUE: Multidetector CT imaging of the abdomen and pelvis was performed using the standard protocol following bolus administration of intravenous contrast. CONTRAST:  122mL ISOVUE-300 IOPAMIDOL (ISOVUE-300) INJECTION 61% COMPARISON:  03/30/2015 FINDINGS: Lower chest: Lung bases are clear. Hepatobiliary: 2.1 cm cyst in segment 2. Status post cholecystectomy. No intrahepatic or extrahepatic ductal dilatation. Pancreas: Within normal limits. Spleen: Within normal limits. Adrenals/Urinary Tract: Adrenal glands are within normal limits. Kidneys are within normal limits.  No hydronephrosis. Bladder is within normal limits. Stomach/Bowel: Stomach is  within normal limits. No evidence of bowel obstruction. Prior appendectomy. Left colonic diverticulosis with acute sigmoid diverticulitis in the lower pelvis (series 2/image 71). Pericolonic inflammatory changes/stranding. No drainable fluid collection/abscess. No free air to suggest macroscopic perforation. Additional mild diverticulitis involving the proximal sigmoid in the left lower abdomen (series 2/image 62). Vascular/Lymphatic: No evidence of abdominal aortic aneurysm. No suspicious abdominopelvic lymphadenopathy. Reproductive: Status post hysterectomy. No adnexal masses. Other: Trace pelvic fluid. Musculoskeletal: Mild degenerative changes of the lower thoracic spine. IMPRESSION: Acute sigmoid diverticulitis. No drainable fluid collection/abscess.  No free air. Electronically Signed   By: Julian Hy M.D.   On: 12/23/2017 09:40    Assessment/Plan Active Problems:   Type 2 diabetes mellitus without complication, without long-term current use of insulin (HCC)   Diverticulitis   1. Acute sigmoid diverticulitis.  Treat supportively with IV  antibiotics, bowel rest.  We will start the patient on clear liquids.  Supportive treatment with antiemetics and pain management. 2. Type 2 diabetes.  We will hold oral agents.  Start on sliding scale insulin.  DVT prophylaxis: scd Code Status: full code Family Communication: discussed with daughter at the bedside Disposition Plan: discharge home once abdominal pain Consults called:  Admission status: observation, medsurg   Kathie Dike MD [facility] Pager [phone]  If 7PM-7AM, please contact night-coverage [email or web address] Password [facility]  12/23/2017, 3:12 PM

## 2017-12-23 NOTE — ED Triage Notes (Signed)
Pt reports pain that starts in pelvic area and radiates to upper abd.  Reports feels bloated.  Reports symptoms started Monday.  Denies n/v/d.  LBM was yesterday.  Reports pain in pelvic area worse with urination.  Reports history of having adhesions that had to be surgically removed several years ago.  Pt says pain feels similar.

## 2017-12-24 ENCOUNTER — Ambulatory Visit: Payer: BLUE CROSS/BLUE SHIELD | Admitting: Nurse Practitioner

## 2017-12-24 DIAGNOSIS — K5792 Diverticulitis of intestine, part unspecified, without perforation or abscess without bleeding: Secondary | ICD-10-CM | POA: Diagnosis not present

## 2017-12-24 DIAGNOSIS — E119 Type 2 diabetes mellitus without complications: Secondary | ICD-10-CM | POA: Diagnosis not present

## 2017-12-24 DIAGNOSIS — K5732 Diverticulitis of large intestine without perforation or abscess without bleeding: Secondary | ICD-10-CM | POA: Diagnosis not present

## 2017-12-24 LAB — GLUCOSE, CAPILLARY
Glucose-Capillary: 190 mg/dL — ABNORMAL HIGH (ref 65–99)
Glucose-Capillary: 111 mg/dL — ABNORMAL HIGH (ref 65–99)
Glucose-Capillary: 162 mg/dL — ABNORMAL HIGH (ref 65–99)
Glucose-Capillary: 185 mg/dL — ABNORMAL HIGH (ref 65–99)

## 2017-12-24 LAB — HIV ANTIBODY (ROUTINE TESTING W REFLEX): HIV Screen 4th Generation wRfx: NONREACTIVE

## 2017-12-24 MED ORDER — GI COCKTAIL ~~LOC~~
30.0000 mL | Freq: Once | ORAL | Status: AC
  Administered 2017-12-24: 30 mL via ORAL
  Filled 2017-12-24: qty 30

## 2017-12-24 MED ORDER — POLYETHYLENE GLYCOL 3350 17 G PO PACK
17.0000 g | PACK | Freq: Every day | ORAL | Status: DC
  Administered 2017-12-24: 17 g via ORAL
  Filled 2017-12-24 (×2): qty 1

## 2017-12-24 NOTE — Progress Notes (Signed)
PROGRESS NOTE    Molly Ryan  UQJ:[id number] DOB: Feb 25, 1961 DOA: 12/23/2017 PCP: Kathyrn Drown, MD    Brief Narrative:  57 year old female with a history of diabetes, admitted to the hospital with abdominal pain.  Found to have acute sigmoid diverticulitis.  On IV antibiotics.   Assessment & Plan:   Active Problems:   Type 2 diabetes mellitus without complication, without long-term current use of insulin (HCC)   Diverticulitis   Acute diverticulitis   1. Acute sigmoid diverticulitis.  Treating supportively with IV antibiotics, bowel rest.  She is tolerating clear liquids.  Will advance diet to his soft diet.  She is not having any vomiting.  Change pain management to oral medications.  She has not had a bowel movement in almost 48 hours.  Will give 1 dose of MiraLAX. 2. Type 2 diabetes.  Oral agents currently on hold.  On sliding scale insulin.  Blood sugars are stable.   DVT prophylaxis: SCDs Code Status: Full code Family Communication: Discussed with patient Disposition Plan: Discharge home once abdominal pain is better, anticipate in the next 24 hours   Consultants:     Procedures:     Antimicrobials:   Ciprofloxacin 3/12 >  Metronidazole 3/12 >   Subjective: No vomiting.  No bowel movements in 48 hours.  She is feeling gassy, but has not passed any gas.  Continues to have lower abdominal pain.  Objective: Vitals:   12/23/17 1152 12/23/17 1948 12/23/17 2109 12/24/17 0516  BP: 131/70  113/67 106/71  Pulse: (!) 55  (!) 58 (!) 53  Resp:   20 16  Temp: 99 F (37.2 C)  98.9 F (37.2 C) 98 F (36.7 C)  TempSrc:   Oral   SpO2: 98% 92% 97% 98%  Weight:      Height:        Intake/Output Summary (Last 24 hours) at 12/24/2017 1313 Last data filed at 12/23/2017 1502 Gross per 24 hour  Intake 113.75 ml  Output -  Net 113.75 ml   Filed Weights   12/23/17 0814  Weight: 86.2 kg (190 lb)    Examination:  General exam: Appears calm and comfortable    Respiratory system: Clear to auscultation. Respiratory effort normal. Cardiovascular system: S1 & S2 heard, RRR. No JVD, murmurs, rubs, gallops or clicks. No pedal edema. Gastrointestinal system: Abdomen is nondistended, soft and tender in lower abdomen. No organomegaly or masses felt. Normal bowel sounds heard. Central nervous system: Alert and oriented. No focal neurological deficits. Extremities: Symmetric 5 x 5 power. Skin: No rashes, lesions or ulcers Psychiatry: Judgement and insight appear normal. Mood & affect appropriate.     Data Reviewed: I have personally reviewed following labs and imaging studies  CBC: Recent Labs  Lab 12/23/17 0821  WBC 14.3*  NEUTROABS 11.9*  HGB 14.1  HCT 42.7  MCV 85.4  PLT 389   Basic Metabolic Panel: Recent Labs  Lab 12/23/17 0821  NA 137  K 3.6  CL 101  CO2 22  GLUCOSE 200*  BUN 8  CREATININE 0.55  CALCIUM 9.3   GFR: Estimated Creatinine Clearance: 76.6 mL/min (by C-G formula based on SCr of 0.55 mg/dL). Liver Function Tests: Recent Labs  Lab 12/23/17 0821  AST 14*  ALT 24  ALKPHOS 65  BILITOT 1.3*  PROT 7.7  ALBUMIN 4.0   Recent Labs  Lab 12/23/17 0821  LIPASE 32   No results for input(s): AMMONIA in the last 168 hours. Coagulation Profile: No results  for input(s): INR, PROTIME in the last 168 hours. Cardiac Enzymes: No results for input(s): CKTOTAL, CKMB, CKMBINDEX, TROPONINI in the last 168 hours. BNP (last 3 results) No results for input(s): PROBNP in the last 8760 hours. HbA1C: No results for input(s): HGBA1C in the last 72 hours. CBG: Recent Labs  Lab 12/23/17 1750 12/23/17 2109 12/24/17 0730 12/24/17 1126  GLUCAP 190* 180* 190* 111*   Lipid Profile: No results for input(s): CHOL, HDL, LDLCALC, TRIG, CHOLHDL, LDLDIRECT in the last 72 hours. Thyroid Function Tests: No results for input(s): TSH, T4TOTAL, FREET4, T3FREE, THYROIDAB in the last 72 hours. Anemia Panel: No results for input(s):  VITAMINB12, FOLATE, FERRITIN, TIBC, IRON, RETICCTPCT in the last 72 hours. Sepsis Labs: No results for input(s): PROCALCITON, LATICACIDVEN in the last 168 hours.  Recent Results (from the past 240 hour(s))  Blood culture (routine x 2)     Status: None (Preliminary result)   Collection Time: 12/23/17 10:34 AM  Result Value Ref Range Status   Specimen Description BLOOD  Final   Special Requests NONE  Final   Culture   Final    NO GROWTH <12 HOURS Performed at [facility], [address] [place], [place] [address]    Report Status PENDING  Incomplete  Blood culture (routine x 2)     Status: None (Preliminary result)   Collection Time: 12/23/17 10:40 AM  Result Value Ref Range Status   Specimen Description BLOOD  Final   Special Requests NONE  Final   Culture   Final    NO GROWTH <12 HOURS Performed at [facility], [address], [place], [place] [address]    Report Status PENDING  Incomplete         Radiology Studies: Ct Abdomen Pelvis W Contrast  Result Date: 12/23/2017 CLINICAL DATA:  Lower pelvic pain x9 days, dysuria. Prior cholecystectomy, hysterectomy, and appendectomy EXAM: CT ABDOMEN AND PELVIS WITH CONTRAST TECHNIQUE: Multidetector CT imaging of the abdomen and pelvis was performed using the standard protocol following bolus administration of intravenous contrast. CONTRAST:  166mL ISOVUE-300 IOPAMIDOL (ISOVUE-300) INJECTION 61% COMPARISON:  03/30/2015 FINDINGS: Lower chest: Lung bases are clear. Hepatobiliary: 2.1 cm cyst in segment 2. Status post cholecystectomy. No intrahepatic or extrahepatic ductal dilatation. Pancreas: Within normal limits. Spleen: Within normal limits. Adrenals/Urinary Tract: Adrenal glands are within normal limits. Kidneys are within normal limits.  No hydronephrosis. Bladder is within normal limits. Stomach/Bowel: Stomach is within normal limits. No evidence of bowel obstruction. Prior appendectomy. Left colonic diverticulosis with acute sigmoid  diverticulitis in the lower pelvis (series 2/image 71). Pericolonic inflammatory changes/stranding. No drainable fluid collection/abscess. No free air to suggest macroscopic perforation. Additional mild diverticulitis involving the proximal sigmoid in the left lower abdomen (series 2/image 62). Vascular/Lymphatic: No evidence of abdominal aortic aneurysm. No suspicious abdominopelvic lymphadenopathy. Reproductive: Status post hysterectomy. No adnexal masses. Other: Trace pelvic fluid. Musculoskeletal: Mild degenerative changes of the lower thoracic spine. IMPRESSION: Acute sigmoid diverticulitis. No drainable fluid collection/abscess.  No free air. Electronically Signed   By: Julian Hy M.D.   On: 12/23/2017 09:40        Scheduled Meds: . citalopram  20 mg Oral Daily  . insulin aspart  0-15 Units Subcutaneous TID WC  . insulin aspart  0-5 Units Subcutaneous QHS  . polyethylene glycol  17 g Oral Daily   Continuous Infusions: . sodium chloride 75 mL/hr at 12/24/17 0532  . ciprofloxacin 400 mg (12/24/17 0532)  . metronidazole Stopped (12/24/17 0844)  LOS: 0 days    Time spent: 74mins    Kathie Dike, MD [facility] Pager [phone]  If 7PM-7AM, please contact night-coverage [email or web address] Password [email or web address] 12/24/2017, 1:13 PM

## 2017-12-25 DIAGNOSIS — K5792 Diverticulitis of intestine, part unspecified, without perforation or abscess without bleeding: Secondary | ICD-10-CM | POA: Diagnosis not present

## 2017-12-25 DIAGNOSIS — K5732 Diverticulitis of large intestine without perforation or abscess without bleeding: Secondary | ICD-10-CM | POA: Diagnosis not present

## 2017-12-25 LAB — BASIC METABOLIC PANEL
Anion gap: 11 (ref 5–15)
BUN: 7 mg/dL (ref 6–20)
CO2: 22 mmol/L (ref 22–32)
Calcium: 8.5 mg/dL — ABNORMAL LOW (ref 8.9–10.3)
Chloride: 104 mmol/L (ref 101–111)
Creatinine, Ser: 0.6 mg/dL (ref 0.44–1.00)
GFR calc Af Amer: >60 mL/min (ref >60)
GFR calc non Af Amer: >60 mL/min (ref >60)
Glucose, Bld: 152 mg/dL — ABNORMAL HIGH (ref 65–99)
Potassium: 3.7 mmol/L (ref 3.5–5.1)
Sodium: 137 mmol/L (ref 135–145)

## 2017-12-25 LAB — CBC
HCT: 40.8 % (ref 36.0–46.0)
Hemoglobin: 13.2 g/dL (ref 12.0–15.0)
MCH: 28.3 pg (ref 26.0–34.0)
MCHC: 32.4 g/dL (ref 30.0–36.0)
MCV: 87.6 fL (ref 78.0–100.0)
Platelets: 255 10*3/uL (ref 150–400)
RBC: 4.66 MIL/uL (ref 3.87–5.11)
RDW: 12.8 % (ref 11.5–15.5)
WBC: 5.9 10*3/uL (ref 4.0–10.5)

## 2017-12-25 LAB — GLUCOSE, CAPILLARY
Glucose-Capillary: 207 mg/dL — ABNORMAL HIGH (ref 65–99)
Glucose-Capillary: 169 mg/dL — ABNORMAL HIGH (ref 65–99)
Glucose-Capillary: 128 mg/dL — ABNORMAL HIGH (ref 65–99)
Glucose-Capillary: 140 mg/dL — ABNORMAL HIGH (ref 65–99)

## 2017-12-25 MED ORDER — HEPARIN SODIUM (PORCINE) 5000 UNIT/ML IJ SOLN
5000.0000 [IU] | Freq: Three times a day (TID) | INTRAMUSCULAR | Status: DC
Start: 2017-12-25 — End: 2017-12-26
  Administered 2017-12-25 – 2017-12-26 (×3): 5000 [IU] via SUBCUTANEOUS
  Filled 2017-12-25 (×3): qty 1

## 2017-12-25 NOTE — Progress Notes (Signed)
Late entry  Patient reported crushing chest pain 10/10. No N&V, no radiation, no diaphoresis. MD made aware. GI cocktail provided, with relief. EKG performed, sinus brady. Will continue to monitor.

## 2017-12-25 NOTE — Progress Notes (Signed)
PROGRESS NOTE    Molly Ryan  RSW:[id number] DOB: [birth year] DOA: 12/23/2017 PCP: Kathyrn Drown, MD    Brief Narrative:   57 year old female with a history of diabetes, admitted to the hospital with abdominal pain.  Found to have acute sigmoid diverticulitis.  On IV antibiotics.   Assessment & Plan:    1. Left lower quadrant abdominal pain and cramping.  This is due to acute sigmoid diverticulitis evident on CT scan, this is second episode of this for the patient in the last several years, being treated conservatively with IV antibiotics, soft diet clinically improved, complaining of some increased cramping and loose stools after MiraLAX which will be held, continue present care likely discharge tomorrow if better with outpatient GI follow-up for eventual colonoscopy.   2. Type 2 diabetes.  Currently on ISS.  CBG (last 3)  Recent Labs    12/24/17 1636 12/24/17 2020 12/25/17 0723  GLUCAP 162* 185* 207*     DVT prophylaxis: Heparin Code Status: Full code Family Communication: Discussed with patient Disposition Plan: Discharge home once abdominal pain is better, anticipate in the next 24 hours   Consultants:     Procedures:     Antimicrobials:   Ciprofloxacin 3/12 >  Metronidazole 3/12 >   Subjective: Patient sitting up in the bed, in no distress but complains of abdominal cramping and mild lower quadrant pain, after MiraLAX she had an episode of 3-4 loose bowel movements, no blood or mucus in stool, denies any fever chills chest pain, no shortness of breath.  Objective: Vitals:   12/24/17 0516 12/24/17 1514 12/24/17 2036 12/25/17 0649  BP: 106/71 119/65 127/69 (!) 128/57  Pulse: (!) 53 (!) 53 (!) 53 (!) 53  Resp: 16 16    Temp: 98 F (36.7 C) 98.7 F (37.1 C) 99 F (37.2 C)   TempSrc:  Oral Oral   SpO2: 98% 98% 100% 94%  Weight:      Height:       No intake or output data in the 24 hours ending 12/25/17 1117 Filed Weights   12/23/17 0814    Weight: 86.2 kg (190 lb)    Examination:  Awake Alert, Oriented X 3, No new F.N deficits, Normal affect [place].AT,PERRAL Supple Neck,No JVD, No cervical lymphadenopathy appriciated.  Symmetrical Chest wall movement, Good air movement bilaterally, CTAB RRR,No Gallops, Rubs or new Murmurs, No Parasternal Heave +ve B.Sounds, Abd Soft, mild LLQ tenderness, No organomegaly appriciated, No rebound - guarding or rigidity. No Cyanosis, Clubbing or edema, No new Rash or bruise  Data Reviewed: I have personally reviewed following labs and imaging studies  CBC: Recent Labs  Lab 12/23/17 0821 12/25/17 0407  WBC 14.3* 5.9  NEUTROABS 11.9*  --   HGB 14.1 13.2  HCT 42.7 40.8  MCV 85.4 87.6  PLT 314 093   Basic Metabolic Panel: Recent Labs  Lab 12/23/17 0821 12/25/17 0407  NA 137 137  K 3.6 3.7  CL 101 104  CO2 22 22  GLUCOSE 200* 152*  BUN 8 7  CREATININE 0.55 0.60  CALCIUM 9.3 8.5*   GFR: Estimated Creatinine Clearance: 76.6 mL/min (by C-G formula based on SCr of 0.6 mg/dL). Liver Function Tests: Recent Labs  Lab 12/23/17 0821  AST 14*  ALT 24  ALKPHOS 65  BILITOT 1.3*  PROT 7.7  ALBUMIN 4.0   Recent Labs  Lab 12/23/17 0821  LIPASE 32   No results for input(s): AMMONIA in the last 168 hours. Coagulation Profile: No  results for input(s): INR, PROTIME in the last 168 hours. Cardiac Enzymes: No results for input(s): CKTOTAL, CKMB, CKMBINDEX, TROPONINI in the last 168 hours. BNP (last 3 results) No results for input(s): PROBNP in the last 8760 hours. HbA1C: No results for input(s): HGBA1C in the last 72 hours. CBG: Recent Labs  Lab 12/24/17 0730 12/24/17 1126 12/24/17 1636 12/24/17 2020 12/25/17 0723  GLUCAP 190* 111* 162* 185* 207*   Lipid Profile: No results for input(s): CHOL, HDL, LDLCALC, TRIG, CHOLHDL, LDLDIRECT in the last 72 hours. Thyroid Function Tests: No results for input(s): TSH, T4TOTAL, FREET4, T3FREE, THYROIDAB in the last 72 hours. Anemia  Panel: No results for input(s): VITAMINB12, FOLATE, FERRITIN, TIBC, IRON, RETICCTPCT in the last 72 hours. Sepsis Labs: No results for input(s): PROCALCITON, LATICACIDVEN in the last 168 hours.  Recent Results (from the past 240 hour(s))  Blood culture (routine x 2)     Status: None (Preliminary result)   Collection Time: 12/23/17 10:34 AM  Result Value Ref Range Status   Specimen Description BLOOD LEFT ANTECUBITAL  Final   Special Requests Blood Culture adequate volume  Final   Culture   Final    NO GROWTH 2 DAYS Performed at [facility], [address], [place], [place] [address]    Report Status PENDING  Incomplete  Blood culture (routine x 2)     Status: None (Preliminary result)   Collection Time: 12/23/17 10:40 AM  Result Value Ref Range Status   Specimen Description BLOOD RIGHT HAND  Final   Special Requests Blood Culture adequate volume  Final   Culture   Final    NO GROWTH 2 DAYS Performed at [facility], [address] [place], [place] [address]    Report Status PENDING  Incomplete      Radiology Studies: No results found.  Scheduled Meds: . citalopram  20 mg Oral Daily  . insulin aspart  0-15 Units Subcutaneous TID WC  . insulin aspart  0-5 Units Subcutaneous QHS  . polyethylene glycol  17 g Oral Daily   Continuous Infusions: . sodium chloride 75 mL/hr at 12/25/17 0622  . ciprofloxacin Stopped (12/25/17 3009)  . metronidazole Stopped (12/25/17 0723)     LOS: 1 day    Time spent: 36mins  Signature  Lala Lund M.D on 12/25/2017 at 11:17 AM  Between 7am to 7pm - Pager - [phone] ( page via [email or web address], text pages only, please mention full 10 digit call back number).  After 7pm go to [email or web address] - password [facility]

## 2017-12-26 DIAGNOSIS — K5792 Diverticulitis of intestine, part unspecified, without perforation or abscess without bleeding: Secondary | ICD-10-CM | POA: Diagnosis not present

## 2017-12-26 DIAGNOSIS — E119 Type 2 diabetes mellitus without complications: Secondary | ICD-10-CM | POA: Diagnosis not present

## 2017-12-26 DIAGNOSIS — K5732 Diverticulitis of large intestine without perforation or abscess without bleeding: Secondary | ICD-10-CM | POA: Diagnosis not present

## 2017-12-26 LAB — CBC
HCT: 37.6 % (ref 36.0–46.0)
Hemoglobin: 11.9 g/dL — ABNORMAL LOW (ref 12.0–15.0)
MCH: 27.4 pg (ref 26.0–34.0)
MCHC: 31.6 g/dL (ref 30.0–36.0)
MCV: 86.6 fL (ref 78.0–100.0)
Platelets: 325 10*3/uL (ref 150–400)
RBC: 4.34 MIL/uL (ref 3.87–5.11)
RDW: 12.5 % (ref 11.5–15.5)
WBC: 4.7 10*3/uL (ref 4.0–10.5)

## 2017-12-26 LAB — BASIC METABOLIC PANEL
Anion gap: 11 (ref 5–15)
BUN: 7 mg/dL (ref 6–20)
CO2: 25 mmol/L (ref 22–32)
Calcium: 8.6 mg/dL — ABNORMAL LOW (ref 8.9–10.3)
Chloride: 101 mmol/L (ref 101–111)
Creatinine, Ser: 0.6 mg/dL (ref 0.44–1.00)
GFR calc Af Amer: >60 mL/min (ref >60)
GFR calc non Af Amer: >60 mL/min (ref >60)
Glucose, Bld: 184 mg/dL — ABNORMAL HIGH (ref 65–99)
Potassium: 3.5 mmol/L (ref 3.5–5.1)
Sodium: 137 mmol/L (ref 135–145)

## 2017-12-26 LAB — GLUCOSE, CAPILLARY
Glucose-Capillary: 163 mg/dL — ABNORMAL HIGH (ref 65–99)
Glucose-Capillary: 214 mg/dL — ABNORMAL HIGH (ref 65–99)

## 2017-12-26 LAB — MAGNESIUM: Magnesium: 1.7 mg/dL (ref 1.7–2.4)

## 2017-12-26 MED ORDER — METRONIDAZOLE 500 MG PO TABS: 500.0000 mg | ORAL_TABLET | Freq: Three times a day (TID) | ORAL | 0 refills | Status: AC

## 2017-12-26 MED ORDER — CIPROFLOXACIN HCL 500 MG PO TABS: 500.0000 mg | ORAL_TABLET | Freq: Two times a day (BID) | ORAL | 0 refills | Status: AC

## 2017-12-26 NOTE — Discharge Summary (Signed)
Physician Discharge Summary  Molly Ryan QPY:[id number] DOB: 1961-09-26 DOA: 12/23/2017  PCP: Kathyrn Drown, MD  Admit date: 12/23/2017 Discharge date: 12/26/2017  Admitted From: home Disposition:  home  Recommendations for Outpatient Follow-up:  1. Follow up with PCP in 1-2 weeks 2. Please obtain BMP/CBC in one week 3. Consider outpatient GI follow up  Home Health: Equipment/Devices:  Discharge Condition: stable CODE STATUS: full code Diet recommendation: Heart Healthy / Carb Modified a   Brief/Interim Summary: 57 year old female with a history of hypertension and diabetes, presents to the hospital with complaints of diffuse abdominal pain.  Found to have evidence of sigmoid diverticulitis on CT scan.  She was admitted for intravenous antibiotics and pain management.  Since starting antibiotics, her abdominal pain has significantly improved.  She is not having any vomiting.  She is tolerating a solid diet.  She has not had any fevers.  Patient is feeling significantly improved and is now not requiring further pain medications.  She wants to discharge home.  It is recommended that she consider outpatient GI follow-up in the next 4-6 weeks to be considered for colonoscopy.  She will be transitioned to an oral course of ciprofloxacin and Flagyl to complete 14-day course.  Discharge Diagnoses:  Active Problems:   Type 2 diabetes mellitus without complication, without long-term current use of insulin (HCC)   Diverticulitis   Acute diverticulitis    Discharge Instructions  Discharge Instructions    Diet - low sodium heart healthy   Complete by:  As directed    Increase activity slowly   Complete by:  As directed      Allergies as of 12/26/2017      Reactions   Morphine And Related    Muscle spasms   Amoxicillin Rash      Medication List    TAKE these medications   aspirin-acetaminophen-caffeine 250-250-65 MG tablet Commonly known as:  EXCEDRIN MIGRAINE Take 2  tablets by mouth every 6 (six) hours as needed for pain. Reported on 01/29/2016   ciprofloxacin 500 MG tablet Commonly known as:  CIPRO Take 1 tablet (500 mg total) by mouth 2 (two) times daily for 10 days.   citalopram 20 MG tablet Commonly known as:  CELEXA TAKE 1 TABLET BY MOUTH DAILY   eszopiclone 2 MG Tabs tablet Commonly known as:  LUNESTA TAKE 1 TABLET BY MOUTH EVERY NIGHT AT BEDTIME   JANUVIA 25 MG tablet Generic drug:  sitaGLIPtin TAKE 1 TABLET(25 MG) BY MOUTH DAILY   metFORMIN 500 MG tablet Commonly known as:  GLUCOPHAGE TAKE 1/2 TABLET BY MOUTH TWICE DAILY   metroNIDAZOLE 500 MG tablet Commonly known as:  FLAGYL Take 1 tablet (500 mg total) by mouth 3 (three) times daily for 10 days.       Allergies  Allergen Reactions  . Morphine And Related     Muscle spasms  . Amoxicillin Rash    Consultations:     Procedures/Studies: Ct Abdomen Pelvis W Contrast  Result Date: 12/23/2017 CLINICAL DATA:  Lower pelvic pain x9 days, dysuria. Prior cholecystectomy, hysterectomy, and appendectomy EXAM: CT ABDOMEN AND PELVIS WITH CONTRAST TECHNIQUE: Multidetector CT imaging of the abdomen and pelvis was performed using the standard protocol following bolus administration of intravenous contrast. CONTRAST:  121mL ISOVUE-300 IOPAMIDOL (ISOVUE-300) INJECTION 61% COMPARISON:  03/30/2015 FINDINGS: Lower chest: Lung bases are clear. Hepatobiliary: 2.1 cm cyst in segment 2. Status post cholecystectomy. No intrahepatic or extrahepatic ductal dilatation. Pancreas: Within normal limits. Spleen: Within normal limits. Adrenals/Urinary  Tract: Adrenal glands are within normal limits. Kidneys are within normal limits.  No hydronephrosis. Bladder is within normal limits. Stomach/Bowel: Stomach is within normal limits. No evidence of bowel obstruction. Prior appendectomy. Left colonic diverticulosis with acute sigmoid diverticulitis in the lower pelvis (series 2/image 71). Pericolonic inflammatory  changes/stranding. No drainable fluid collection/abscess. No free air to suggest macroscopic perforation. Additional mild diverticulitis involving the proximal sigmoid in the left lower abdomen (series 2/image 62). Vascular/Lymphatic: No evidence of abdominal aortic aneurysm. No suspicious abdominopelvic lymphadenopathy. Reproductive: Status post hysterectomy. No adnexal masses. Other: Trace pelvic fluid. Musculoskeletal: Mild degenerative changes of the lower thoracic spine. IMPRESSION: Acute sigmoid diverticulitis. No drainable fluid collection/abscess.  No free air. Electronically Signed   By: Julian Hy M.D.   On: 12/23/2017 09:40       Subjective: Abdominal pain is better. No vomiting. Diarrhea resolved. Tolerating diet  Discharge Exam: Vitals:   12/25/17 2039 12/26/17 0508  BP: (!) 145/77 122/67  Pulse: (!) 52 (!) 58  Resp:    Temp: 98.4 F (36.9 C) 98.4 F (36.9 C)  SpO2: 99% 98%   Vitals:   12/25/17 0917 12/25/17 1300 12/25/17 2039 12/26/17 0508  BP:  122/63 (!) 145/77 122/67  Pulse:  65 (!) 52 (!) 58  Resp:      Temp:  98.8 F (37.1 C) 98.4 F (36.9 C) 98.4 F (36.9 C)  TempSrc:   Oral Oral  SpO2: 96% 97% 99% 98%  Weight:      Height:        General: Pt is alert, awake, not in acute distress Cardiovascular: RRR, S1/S2 +, no rubs, no gallops Respiratory: CTA bilaterally, no wheezing, no rhonchi Abdominal: Soft, NT, ND, bowel sounds + Extremities: no edema, no cyanosis    The results of significant diagnostics from this hospitalization (including imaging, microbiology, ancillary and laboratory) are listed below for reference.     Microbiology: Recent Results (from the past 240 hour(s))  Blood culture (routine x 2)     Status: None (Preliminary result)   Collection Time: 12/23/17 10:34 AM  Result Value Ref Range Status   Specimen Description BLOOD LEFT ANTECUBITAL  Final   Special Requests Blood Culture adequate volume  Final   Culture   Final    NO  GROWTH 3 DAYS Performed at [facility], [address], [place], [place] [address]    Report Status PENDING  Incomplete  Blood culture (routine x 2)     Status: None (Preliminary result)   Collection Time: 12/23/17 10:40 AM  Result Value Ref Range Status   Specimen Description BLOOD RIGHT HAND  Final   Special Requests Blood Culture adequate volume  Final   Culture   Final    NO GROWTH 3 DAYS Performed at [facility], [address], [place], [place] [address]    Report Status PENDING  Incomplete     Labs: BNP (last 3 results) No results for input(s): BNP in the last 8760 hours. Basic Metabolic Panel: Recent Labs  Lab 12/23/17 0821 12/25/17 0407 12/26/17 0712  NA 137 137 137  K 3.6 3.7 3.5  CL 101 104 101  CO2 22 22 25   GLUCOSE 200* 152* 184*  BUN 8 7 7   CREATININE 0.55 0.60 0.60  CALCIUM 9.3 8.5* 8.6*  MG  --   --  1.7   Liver Function Tests: Recent Labs  Lab 12/23/17 0821  AST 14*  ALT 24  ALKPHOS 65  BILITOT 1.3*  PROT 7.7  ALBUMIN 4.0   Recent Labs  Lab 12/23/17 0821  LIPASE 32   No results for input(s): AMMONIA in the last 168 hours. CBC: Recent Labs  Lab 12/23/17 0821 12/25/17 0407 12/26/17 0712  WBC 14.3* 5.9 4.7  NEUTROABS 11.9*  --   --   HGB 14.1 13.2 11.9*  HCT 42.7 40.8 37.6  MCV 85.4 87.6 86.6  PLT 314 255 325   Cardiac Enzymes: No results for input(s): CKTOTAL, CKMB, CKMBINDEX, TROPONINI in the last 168 hours. BNP: Invalid input(s): POCBNP CBG: Recent Labs  Lab 12/25/17 1209 12/25/17 1621 12/25/17 2016 12/26/17 0755 12/26/17 1129  GLUCAP 169* 128* 140* 163* 214*   D-Dimer No results for input(s): DDIMER in the last 72 hours. Hgb A1c No results for input(s): HGBA1C in the last 72 hours. Lipid Profile No results for input(s): CHOL, HDL, LDLCALC, TRIG, CHOLHDL, LDLDIRECT in the last 72 hours. Thyroid function studies No results for input(s): TSH, T4TOTAL, T3FREE, THYROIDAB in the last 72 hours.  Invalid input(s):  FREET3 Anemia work up No results for input(s): VITAMINB12, FOLATE, FERRITIN, TIBC, IRON, RETICCTPCT in the last 72 hours. Urinalysis    Component Value Date/Time   COLORURINE YELLOW 12/23/2017 0816   APPEARANCEUR CLEAR 12/23/2017 0816   LABSPEC >1.046 (H) 12/23/2017 0816   PHURINE 6.0 12/23/2017 0816   GLUCOSEU 150 (A) 12/23/2017 0816   HGBUR NEGATIVE 12/23/2017 0816   BILIRUBINUR NEGATIVE 12/23/2017 0816   BILIRUBINUR ++ 03/18/2013 1447   KETONESUR 5 (A) 12/23/2017 0816   PROTEINUR NEGATIVE 12/23/2017 0816   UROBILINOGEN 0.2 04/22/2013 0342   NITRITE NEGATIVE 12/23/2017 0816   LEUKOCYTESUR NEGATIVE 12/23/2017 0816   Sepsis Labs Invalid input(s): PROCALCITONIN,  WBC,  LACTICIDVEN Microbiology Recent Results (from the past 240 hour(s))  Blood culture (routine x 2)     Status: None (Preliminary result)   Collection Time: 12/23/17 10:34 AM  Result Value Ref Range Status   Specimen Description BLOOD LEFT ANTECUBITAL  Final   Special Requests Blood Culture adequate volume  Final   Culture   Final    NO GROWTH 3 DAYS Performed at [facility], [address] [place], [place] [address]    Report Status PENDING  Incomplete  Blood culture (routine x 2)     Status: None (Preliminary result)   Collection Time: 12/23/17 10:40 AM  Result Value Ref Range Status   Specimen Description BLOOD RIGHT HAND  Final   Special Requests Blood Culture adequate volume  Final   Culture   Final    NO GROWTH 3 DAYS Performed at [facility], [address] [place], [place] [address]    Report Status PENDING  Incomplete     Time coordinating discharge: Over 30 minutes  SIGNED:   Kathie Dike, MD  [facility] 12/26/2017, 12:39 PM Pager   If 7PM-7AM, please contact night-coverage [email or web address] Password [email or web address]

## 2017-12-26 NOTE — Progress Notes (Signed)
Patient is to be discharged home and in stable condition. Patient's IV removed, WNL. Patient given discharge instructions and verbalized understanding. Patient denies the need for a wheelchair escort out, requesting to ambulate with the help of husband.  Celestia Khat, RN

## 2017-12-29 LAB — CULTURE, BLOOD (ROUTINE X 2)
Culture: NO GROWTH
Culture: NO GROWTH
Special Requests: ADEQUATE
Special Requests: ADEQUATE

## 2018-02-17 ENCOUNTER — Other Ambulatory Visit: Payer: Self-pay | Admitting: Nurse Practitioner

## 2018-02-17 NOTE — Telephone Encounter (Signed)
May have this in 3 refills 

## 2018-03-07 ENCOUNTER — Other Ambulatory Visit: Payer: Self-pay | Admitting: Nurse Practitioner

## 2018-03-27 ENCOUNTER — Ambulatory Visit: Payer: 59 | Admitting: Nurse Practitioner

## 2018-03-27 ENCOUNTER — Encounter: Payer: Self-pay | Admitting: Nurse Practitioner

## 2018-03-27 VITALS — BP 128/82 | HR 83 | Ht 60.0 in | Wt 196.6 lb

## 2018-03-27 DIAGNOSIS — E119 Type 2 diabetes mellitus without complications: Secondary | ICD-10-CM | POA: Diagnosis not present

## 2018-03-27 DIAGNOSIS — E559 Vitamin D deficiency, unspecified: Secondary | ICD-10-CM

## 2018-03-27 DIAGNOSIS — Z79899 Other long term (current) drug therapy: Secondary | ICD-10-CM

## 2018-03-27 DIAGNOSIS — Z1231 Encounter for screening mammogram for malignant neoplasm of breast: Secondary | ICD-10-CM | POA: Diagnosis not present

## 2018-03-27 DIAGNOSIS — R5383 Other fatigue: Secondary | ICD-10-CM

## 2018-03-27 DIAGNOSIS — Z1239 Encounter for other screening for malignant neoplasm of breast: Secondary | ICD-10-CM

## 2018-03-27 MED ORDER — SITAGLIPTIN PHOSPHATE 25 MG PO TABS: ORAL_TABLET | ORAL | 5 refills | Status: DC

## 2018-03-28 ENCOUNTER — Encounter: Payer: Self-pay | Admitting: Nurse Practitioner

## 2018-03-28 DIAGNOSIS — R5383 Other fatigue: Secondary | ICD-10-CM | POA: Diagnosis not present

## 2018-03-28 DIAGNOSIS — Z79899 Other long term (current) drug therapy: Secondary | ICD-10-CM | POA: Diagnosis not present

## 2018-03-28 DIAGNOSIS — E559 Vitamin D deficiency, unspecified: Secondary | ICD-10-CM | POA: Diagnosis not present

## 2018-03-28 DIAGNOSIS — E119 Type 2 diabetes mellitus without complications: Secondary | ICD-10-CM | POA: Diagnosis not present

## 2018-03-28 NOTE — Progress Notes (Signed)
Subjective: Presents for recheck on her diabetes.  Compliant with medications.  Has started a new job which she greatly enjoys.  Denies CP/ischemic type pain or SOB. No visual changes. No difficulty speaking or swallowing. No numbness or weakness of the face, arms or legs.  Minimal edema.  No pain burning or numbness in the feet. Diabetic Foot Exam - Simple   Simple Foot Form Diabetic Foot exam was performed with the following findings:  Yes 03/27/2018  3:00 PM  Visual Inspection No deformities, no ulcerations, no other skin breakdown bilaterally:  Yes Sensation Testing Intact to touch and monofilament testing bilaterally:  Yes Pulse Check See comments:  Yes Comments DP pulses present bilaterally. Toes warm with normal cap refill.        Objective:   BP 128/82 (BP Location: Right Arm, Patient Position: Sitting)   Pulse 83   Ht 5' (1.524 m)   Wt 196 lb 9.6 oz (89.2 kg)   SpO2 97%   BMI 38.40 kg/m  NAD.  Alert, oriented.  Calm cheerful affect.  Lungs clear.  Heart regular rate and rhythm.  Lower extremities no edema.  Large waist circumference.  Assessment:   Problem List Items Addressed This Visit      Endocrine   Type 2 diabetes mellitus without complication, without long-term current use of insulin (HCC) - Primary   Relevant Medications   sitaGLIPtin (JANUVIA) 25 MG tablet   Other Relevant Orders   HgB T2W   Basic Metabolic Panel (BMET)   Lipid Profile   Urine Microalbumin w/creat. ratio     Other   Vitamin D deficiency   Relevant Orders   Vitamin D (25 hydroxy)    Other Visit Diagnoses    Screening for breast cancer       Relevant Orders   MM DIGITAL SCREENING BILATERAL   High risk medication use       Relevant Orders   Hepatic function panel   Fatigue, unspecified type       Relevant Orders   TSH       Plan:   Meds ordered this encounter  Medications  . sitaGLIPtin (JANUVIA) 25 MG tablet    Sig: TAKE 1 TABLET(25 MG) BY MOUTH DAILY    Dispense:  30  tablet    Refill:  5    Order Specific Question:   Supervising Provider    Answer:   Mikey Kirschner [[id number]]   Continue current medications as directed.  Lab work pending.  Encouraged regular activity healthy diet and continued weight loss efforts.   Return in about 6 months (around 09/26/2018) for recheck.

## 2018-03-29 LAB — MICROALBUMIN / CREATININE URINE RATIO
Creatinine, Urine: 57.6 mg/dL
Microalb/Creat Ratio: <5.2 mg/g creat (ref 0.0–30.0)
Microalbumin, Urine: <3 ug/mL

## 2018-03-30 LAB — BASIC METABOLIC PANEL
BUN/Creatinine Ratio: 13 (ref 9–23)
BUN: 10 mg/dL (ref 6–24)
CO2: 24 mmol/L (ref 20–29)
Calcium: 9.5 mg/dL (ref 8.7–10.2)
Chloride: 99 mmol/L (ref 96–106)
Creatinine, Ser: 0.78 mg/dL (ref 0.57–1.00)
GFR calc Af Amer: 98 mL/min/{1.73_m2} (ref >59)
GFR calc non Af Amer: 85 mL/min/{1.73_m2} (ref >59)
Glucose: 191 mg/dL — ABNORMAL HIGH (ref 65–99)
Potassium: 4.8 mmol/L (ref 3.5–5.2)
Sodium: 139 mmol/L (ref 134–144)

## 2018-03-30 LAB — HEPATIC FUNCTION PANEL
ALT: 35 IU/L — ABNORMAL HIGH (ref 0–32)
AST: 23 IU/L (ref 0–40)
Albumin: 4.7 g/dL (ref 3.5–5.5)
Alkaline Phosphatase: 69 IU/L (ref 39–117)
Bilirubin Total: 0.4 mg/dL (ref 0.0–1.2)
Bilirubin, Direct: 0.13 mg/dL (ref 0.00–0.40)
Total Protein: 7.2 g/dL (ref 6.0–8.5)

## 2018-03-30 LAB — HEMOGLOBIN A1C
Est. average glucose Bld gHb Est-mCnc: 194 mg/dL
Hgb A1c MFr Bld: 8.4 % — ABNORMAL HIGH (ref 4.8–5.6)

## 2018-03-30 LAB — LIPID PANEL
Chol/HDL Ratio: 3.2 ratio (ref 0.0–4.4)
Cholesterol, Total: 187 mg/dL (ref 100–199)
HDL: 59 mg/dL (ref >39)
LDL Calculated: 103 mg/dL — ABNORMAL HIGH (ref 0–99)
Triglycerides: 126 mg/dL (ref 0–149)
VLDL Cholesterol Cal: 25 mg/dL (ref 5–40)

## 2018-03-30 LAB — VITAMIN D 25 HYDROXY (VIT D DEFICIENCY, FRACTURES): Vit D, 25-Hydroxy: 12.9 ng/mL — ABNORMAL LOW (ref 30.0–100.0)

## 2018-03-30 LAB — TSH: TSH: 0.911 u[IU]/mL (ref 0.450–4.500)

## 2018-04-01 ENCOUNTER — Other Ambulatory Visit: Payer: Self-pay | Admitting: Nurse Practitioner

## 2018-04-01 MED ORDER — SITAGLIPTIN PHOSPHATE 50 MG PO TABS: 50.0000 mg | ORAL_TABLET | Freq: Every day | ORAL | 5 refills | Status: DC

## 2018-04-01 MED ORDER — VITAMIN D (ERGOCALCIFEROL) 1.25 MG (50000 UNIT) PO CAPS
50000.0000 [IU] | ORAL_CAPSULE | ORAL | 5 refills | Status: DC
Start: 2018-04-01 — End: 2020-01-03

## 2018-04-03 ENCOUNTER — Ambulatory Visit (HOSPITAL_COMMUNITY): Payer: 59

## 2018-04-06 ENCOUNTER — Ambulatory Visit (HOSPITAL_COMMUNITY)
Admission: RE | Admit: 2018-04-06 | Discharge: 2018-04-06 | Disposition: A | Discharge status: Home / Self Care | Payer: 59 | Source: Ambulatory Visit | Attending: Family Medicine | Admitting: Family Medicine

## 2018-04-06 DIAGNOSIS — Z1231 Encounter for screening mammogram for malignant neoplasm of breast: Secondary | ICD-10-CM | POA: Diagnosis not present

## 2018-04-06 IMAGING — MG DIGITAL SCREENING BILATERAL MAMMOGRAM WITH CAD
4 series · 4 of 4 positions shown · non-contrast
Comparison: Previous exam(s).

CLINICAL DATA: Screening.

EXAM:
DIGITAL SCREENING BILATERAL MAMMOGRAM WITH CAD

[L MLO]
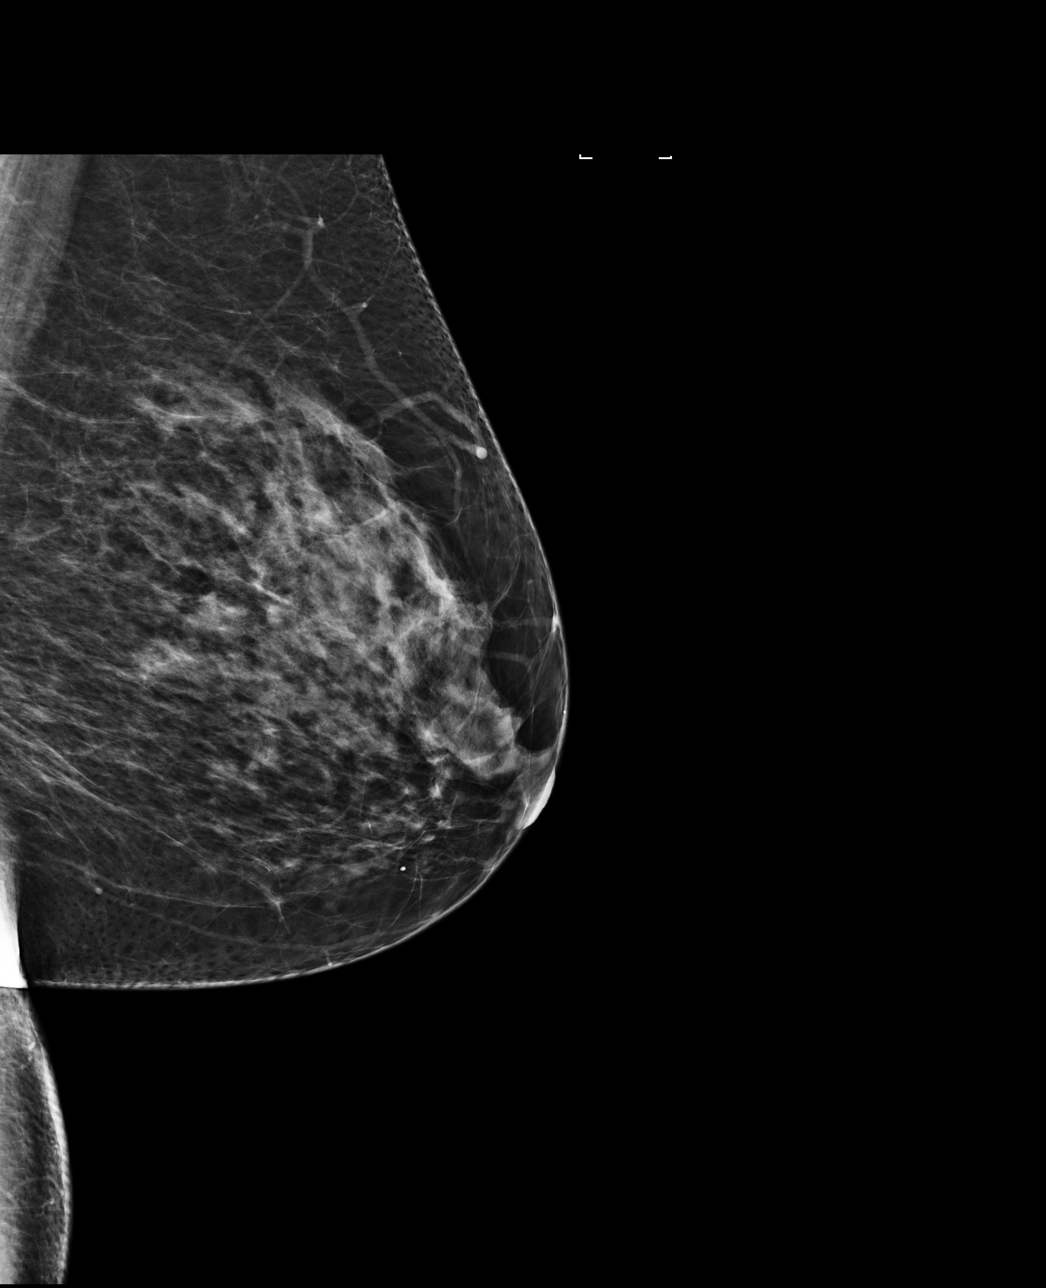

[R CC]
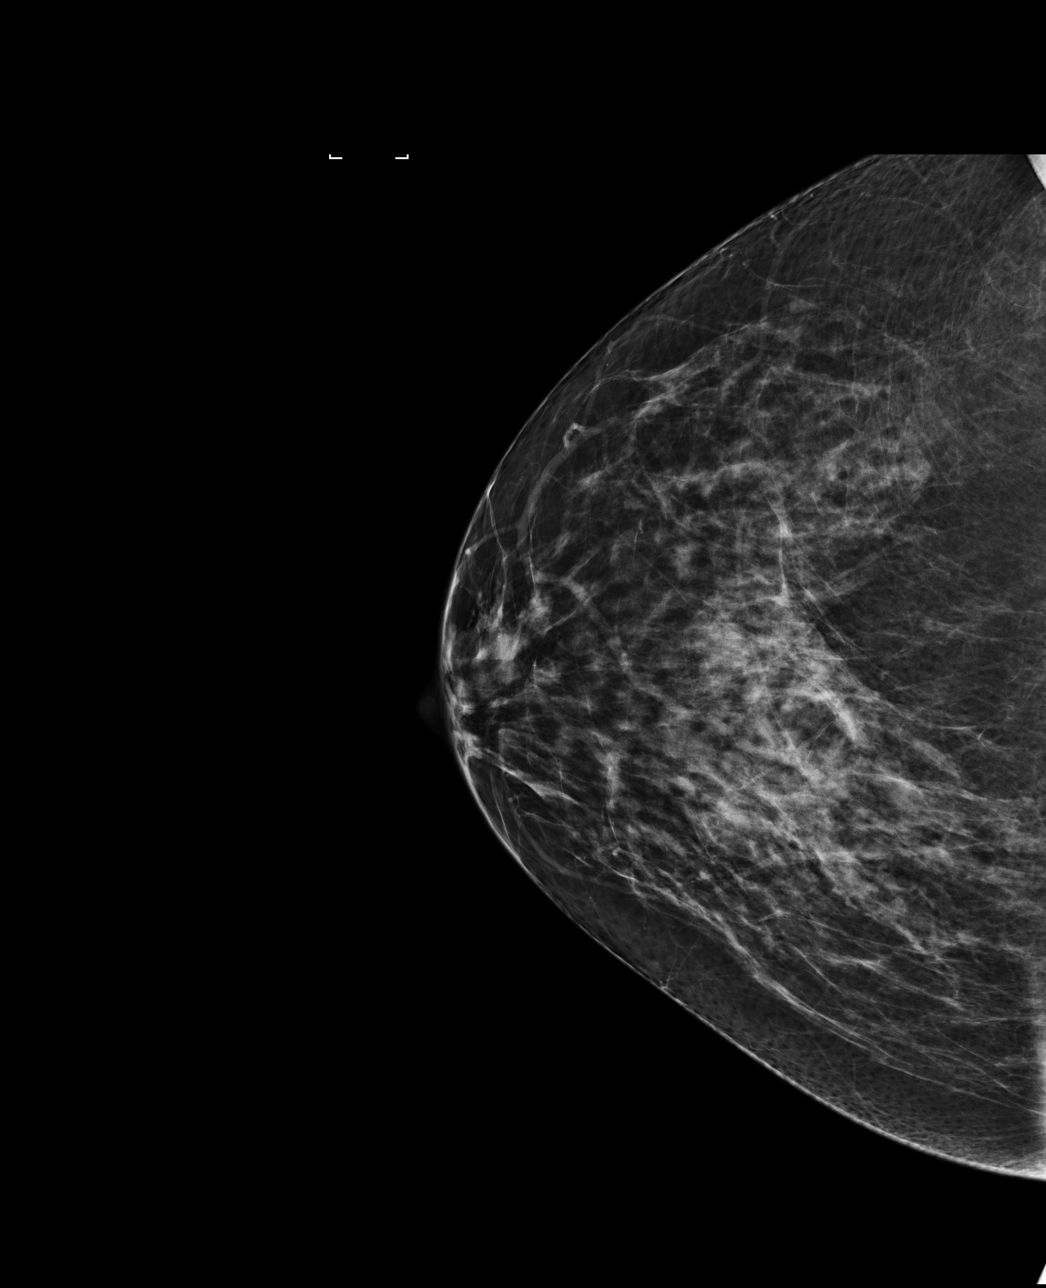

[L CC]
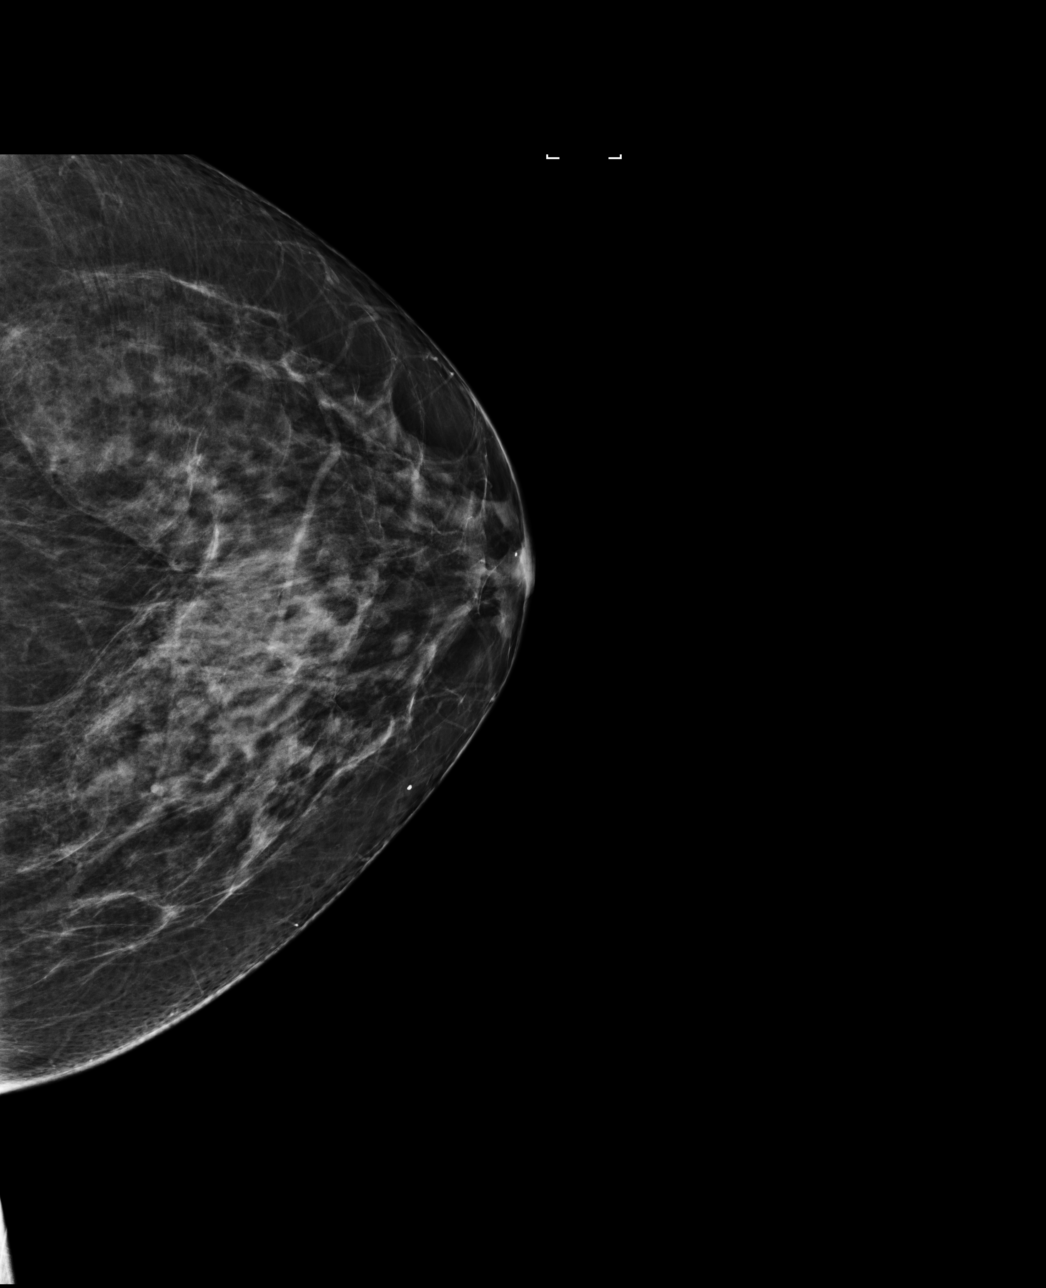

[R MLO]
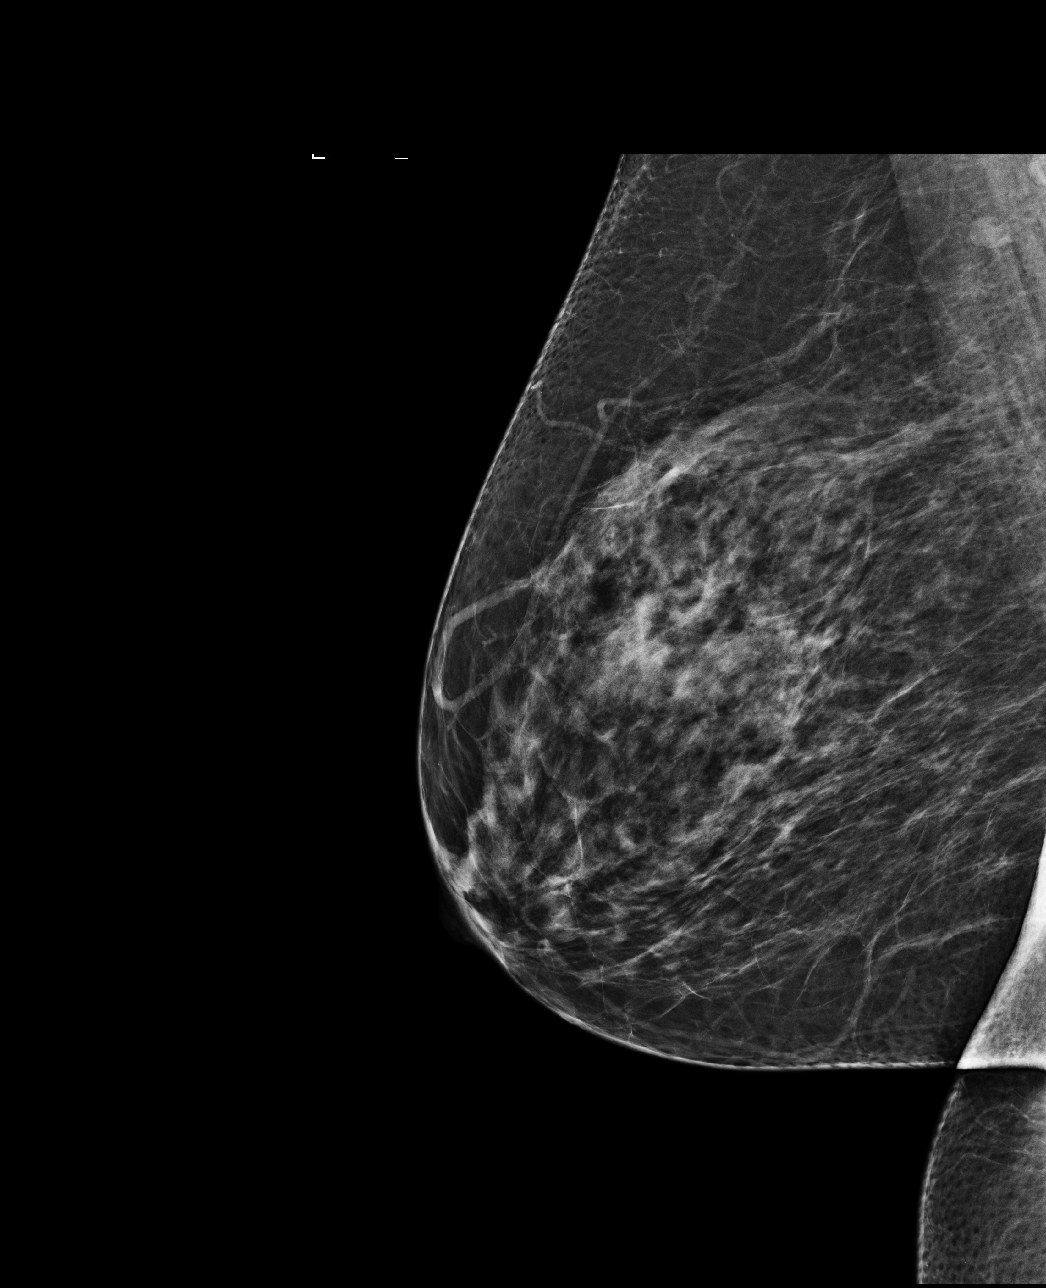

[4 of 4 positions shown; findings below may reference images not displayed]

ACR Breast Density Category c: The breast tissue is heterogeneously
dense, which may obscure small masses.
FINDINGS: There are no findings suspicious for malignancy. Images were
processed with CAD.
IMPRESSION: No mammographic evidence of malignancy. A result letter of this
screening mammogram will be mailed directly to the patient.

RECOMMENDATION:
Screening mammogram in one year. (Code:[0J])

BI-RADS CATEGORY  1: Negative.

## 2018-05-02 ENCOUNTER — Other Ambulatory Visit: Payer: Self-pay | Admitting: Family Medicine

## 2018-06-19 ENCOUNTER — Other Ambulatory Visit: Payer: Self-pay | Admitting: *Deleted

## 2018-06-19 MED ORDER — CITALOPRAM HYDROBROMIDE 20 MG PO TABS: 20.0000 mg | ORAL_TABLET | Freq: Every day | ORAL | 5 refills | Status: DC

## 2018-06-20 ENCOUNTER — Other Ambulatory Visit: Payer: Self-pay | Admitting: Family Medicine

## 2018-06-22 NOTE — Telephone Encounter (Signed)
This +3 refills needs follow-up office visit by December

## 2018-07-16 ENCOUNTER — Ambulatory Visit: Payer: BLUE CROSS/BLUE SHIELD | Admitting: Family Medicine

## 2018-08-14 ENCOUNTER — Encounter: Payer: Self-pay | Admitting: Family Medicine

## 2018-08-14 ENCOUNTER — Ambulatory Visit: Payer: BLUE CROSS/BLUE SHIELD | Admitting: Family Medicine

## 2018-08-14 VITALS — BP 138/90 | Ht 60.0 in | Wt 198.0 lb

## 2018-08-14 DIAGNOSIS — E119 Type 2 diabetes mellitus without complications: Secondary | ICD-10-CM

## 2018-08-14 DIAGNOSIS — E1169 Type 2 diabetes mellitus with other specified complication: Secondary | ICD-10-CM | POA: Diagnosis not present

## 2018-08-14 DIAGNOSIS — Z13220 Encounter for screening for lipoid disorders: Secondary | ICD-10-CM

## 2018-08-14 DIAGNOSIS — R5383 Other fatigue: Secondary | ICD-10-CM | POA: Diagnosis not present

## 2018-08-14 DIAGNOSIS — E785 Hyperlipidemia, unspecified: Secondary | ICD-10-CM

## 2018-08-14 LAB — POCT GLYCOSYLATED HEMOGLOBIN (HGB A1C): Hemoglobin A1C: 8.5 % — AB (ref 4.0–5.6)

## 2018-08-14 MED ORDER — SITAGLIPTIN PHOS-METFORMIN HCL 50-500 MG PO TABS: 1.0000 | ORAL_TABLET | Freq: Two times a day (BID) | ORAL | 5 refills | Status: DC

## 2018-08-14 MED ORDER — ESZOPICLONE 2 MG PO TABS: 2.0000 mg | ORAL_TABLET | Freq: Every day | ORAL | 5 refills | Status: DC

## 2018-08-14 NOTE — Progress Notes (Signed)
   Subjective:    Patient ID: Molly Ryan, female    DOB: [birth year], 57 y.o.   MRN: [id number]  Diabetes  She presents for her follow-up diabetic visit. She has type 2 diabetes mellitus. There are no hypoglycemic associated symptoms. Pertinent negatives for hypoglycemia include no confusion or dizziness. There are no diabetic associated symptoms. Pertinent negatives for diabetes include no chest pain, no fatigue, no polydipsia, no polyphagia and no weakness. There are no hypoglycemic complications. There are no diabetic complications. She does not see a podiatrist.Eye exam is current.  Patient states she could do better job with diet she is active at her job but does not have any exercise plan She states her moods overall are doing well She does have insomnia issues takes [place] for that She relates she does not check her sugars frequently Results for orders placed or performed in visit on 08/14/18  POCT HgB A1C  Result Value Ref Range   Hemoglobin A1C 8.5 (A) 4.0 - 5.6 %   HbA1c POC (<> result, manual entry)     HbA1c, POC (prediabetic range)     HbA1c, POC (controlled diabetic range)       Review of Systems  Constitutional: Negative for activity change, appetite change and fatigue.  HENT: Negative for congestion and rhinorrhea.   Respiratory: Negative for cough and shortness of breath.   Cardiovascular: Negative for chest pain and leg swelling.  Gastrointestinal: Negative for abdominal pain and diarrhea.  Endocrine: Negative for polydipsia and polyphagia.  Skin: Negative for color change.  Neurological: Negative for dizziness and weakness.  Psychiatric/Behavioral: Negative for behavioral problems and confusion.       Objective:   Physical Exam  Constitutional: She appears well-nourished. No distress.  HENT:  Head: Normocephalic and atraumatic.  Eyes: Right eye exhibits no discharge. Left eye exhibits no discharge.  Neck: No tracheal deviation present.  Cardiovascular:  Normal rate, regular rhythm and normal heart sounds.  No murmur heard. Pulmonary/Chest: Effort normal and breath sounds normal. No respiratory distress.  Musculoskeletal: She exhibits no edema.  Lymphadenopathy:    She has no cervical adenopathy.  Neurological: She is alert. Coordination normal.  Skin: Skin is warm and dry.  Psychiatric: She has a normal mood and affect. Her behavior is normal.  Vitals reviewed.         Assessment & Plan:  Diabetes subpar control Increase medication Use Janumet 50/500 twice daily Send his sugar readings in a few weeks time Follow-up lab work A1c plus other lab work in 3 to 4 months Medication for insomnia reviewed Better diet regular physical activity try to lose weight all discussed.

## 2018-08-14 NOTE — Patient Instructions (Signed)
Diabetes Mellitus and Nutrition When you have diabetes (diabetes mellitus), it is very important to have healthy eating habits because your blood sugar (glucose) levels are greatly affected by what you eat and drink. Eating healthy foods in the appropriate amounts, at about the same times every day, can help you:  Control your blood glucose.  Lower your risk of heart disease.  Improve your blood pressure.  Reach or maintain a healthy weight.  Every person with diabetes is different, and each person has different needs for a meal plan. Your health care provider may recommend that you work with a diet and nutrition specialist (dietitian) to make a meal plan that is best for you. Your meal plan may vary depending on factors such as:  The calories you need.  The medicines you take.  Your weight.  Your blood glucose, blood pressure, and cholesterol levels.  Your activity level.  Other health conditions you have, such as heart or kidney disease.  How do carbohydrates affect me? Carbohydrates affect your blood glucose level more than any other type of food. Eating carbohydrates naturally increases the amount of glucose in your blood. Carbohydrate counting is a method for keeping track of how many carbohydrates you eat. Counting carbohydrates is important to keep your blood glucose at a healthy level, especially if you use insulin or take certain oral diabetes medicines. It is important to know how many carbohydrates you can safely have in each meal. This is different for every person. Your dietitian can help you calculate how many carbohydrates you should have at each meal and for snack. Foods that contain carbohydrates include:  Bread, cereal, rice, pasta, and crackers.  Potatoes and corn.  Peas, beans, and lentils.  Milk and yogurt.  Fruit and juice.  Desserts, such as cakes, cookies, ice cream, and candy.  How does alcohol affect me? Alcohol can cause a sudden decrease in blood  glucose (hypoglycemia), especially if you use insulin or take certain oral diabetes medicines. Hypoglycemia can be a life-threatening condition. Symptoms of hypoglycemia (sleepiness, dizziness, and confusion) are similar to symptoms of having too much alcohol. If your health care provider says that alcohol is safe for you, follow these guidelines:  Limit alcohol intake to no more than 1 drink per day for nonpregnant women and 2 drinks per day for men. One drink equals 12 oz of beer, 5 oz of wine, or 1 oz of hard liquor.  Do not drink on an empty stomach.  Keep yourself hydrated with water, diet soda, or unsweetened iced tea.  Keep in mind that regular soda, juice, and other mixers may contain a lot of sugar and must be counted as carbohydrates.  What are tips for following this plan? Reading food labels  Start by checking the serving size on the label. The amount of calories, carbohydrates, fats, and other nutrients listed on the label are based on one serving of the food. Many foods contain more than one serving per package.  Check the total grams (g) of carbohydrates in one serving. You can calculate the number of servings of carbohydrates in one serving by dividing the total carbohydrates by 15. For example, if a food has 30 g of total carbohydrates, it would be equal to 2 servings of carbohydrates.  Check the number of grams (g) of saturated and trans fats in one serving. Choose foods that have low or no amount of these fats.  Check the number of milligrams (mg) of sodium in one serving. Most people   should limit total sodium intake to less than 2,300 mg per day.  Always check the nutrition information of foods labeled as "low-fat" or "nonfat". These foods may be higher in added sugar or refined carbohydrates and should be avoided.  Talk to your dietitian to identify your daily goals for nutrients listed on the label. Shopping  Avoid buying canned, premade, or processed foods. These  foods tend to be high in fat, sodium, and added sugar.  Shop around the outside edge of the grocery store. This includes fresh fruits and vegetables, bulk grains, fresh meats, and fresh dairy. Cooking  Use low-heat cooking methods, such as baking, instead of high-heat cooking methods like deep frying.  Cook using healthy oils, such as olive, canola, or sunflower oil.  Avoid cooking with butter, cream, or high-fat meats. Meal planning  Eat meals and snacks regularly, preferably at the same times every day. Avoid going long periods of time without eating.  Eat foods high in fiber, such as fresh fruits, vegetables, beans, and whole grains. Talk to your dietitian about how many servings of carbohydrates you can eat at each meal.  Eat 4-6 ounces of lean protein each day, such as lean meat, chicken, fish, eggs, or tofu. 1 ounce is equal to 1 ounce of meat, chicken, or fish, 1 egg, or 1/4 cup of tofu.  Eat some foods each day that contain healthy fats, such as avocado, nuts, seeds, and fish. Lifestyle   Check your blood glucose regularly.  Exercise at least 30 minutes 5 or more days each week, or as told by your health care provider.  Take medicines as told by your health care provider.  Do not use any products that contain nicotine or tobacco, such as cigarettes and e-cigarettes. If you need help quitting, ask your health care provider.  Work with a counselor or diabetes educator to identify strategies to manage stress and any emotional and social challenges. What are some questions to ask my health care provider?  Do I need to meet with a diabetes educator?  Do I need to meet with a dietitian?  What number can I call if I have questions?  When are the best times to check my blood glucose? Where to find more information:  American Diabetes Association: diabetes.org/food-and-fitness/food  Academy of Nutrition and Dietetics:  www.eatright.org/resources/health/diseases-and-conditions/diabetes  National Institute of Diabetes and Digestive and Kidney Diseases (NIH): www.niddk.nih.gov/health-information/diabetes/overview/diet-eating-physical-activity Summary  A healthy meal plan will help you control your blood glucose and maintain a healthy lifestyle.  Working with a diet and nutrition specialist (dietitian) can help you make a meal plan that is best for you.  Keep in mind that carbohydrates and alcohol have immediate effects on your blood glucose levels. It is important to count carbohydrates and to use alcohol carefully. This information is not intended to replace advice given to you by your health care provider. Make sure you discuss any questions you have with your health care provider. Document Released: 06/27/2005 Document Revised: 11/04/2016 Document Reviewed: 11/04/2016 Elsevier Interactive Patient Education  2018 [employer]  

## 2018-09-28 ENCOUNTER — Ambulatory Visit: Payer: BLUE CROSS/BLUE SHIELD | Admitting: Family Medicine

## 2018-12-08 DIAGNOSIS — E119 Type 2 diabetes mellitus without complications: Secondary | ICD-10-CM | POA: Diagnosis not present

## 2018-12-08 DIAGNOSIS — Z13220 Encounter for screening for lipoid disorders: Secondary | ICD-10-CM | POA: Diagnosis not present

## 2018-12-08 DIAGNOSIS — R5383 Other fatigue: Secondary | ICD-10-CM | POA: Diagnosis not present

## 2018-12-09 LAB — HEMOGLOBIN A1C
Est. average glucose Bld gHb Est-mCnc: 197 mg/dL
Hgb A1c MFr Bld: 8.5 % — ABNORMAL HIGH (ref 4.8–5.6)

## 2018-12-09 LAB — BASIC METABOLIC PANEL
BUN/Creatinine Ratio: 22 (ref 9–23)
BUN: 15 mg/dL (ref 6–24)
CO2: 23 mmol/L (ref 20–29)
Calcium: 9.8 mg/dL (ref 8.7–10.2)
Chloride: 99 mmol/L (ref 96–106)
Creatinine, Ser: 0.69 mg/dL (ref 0.57–1.00)
GFR calc Af Amer: 112 mL/min/{1.73_m2} (ref >59)
GFR calc non Af Amer: 97 mL/min/{1.73_m2} (ref >59)
Glucose: 199 mg/dL — ABNORMAL HIGH (ref 65–99)
Potassium: 4.4 mmol/L (ref 3.5–5.2)
Sodium: 138 mmol/L (ref 134–144)

## 2018-12-09 LAB — LIPID PANEL
Chol/HDL Ratio: 3.2 ratio (ref 0.0–4.4)
Cholesterol, Total: 179 mg/dL (ref 100–199)
HDL: 56 mg/dL
LDL Calculated: 97 mg/dL (ref 0–99)
Triglycerides: 131 mg/dL (ref 0–149)
VLDL Cholesterol Cal: 26 mg/dL (ref 5–40)

## 2018-12-16 ENCOUNTER — Ambulatory Visit: Payer: 59 | Admitting: Family Medicine

## 2018-12-16 ENCOUNTER — Encounter: Payer: Self-pay | Admitting: Family Medicine

## 2018-12-16 VITALS — BP 128/86 | Ht 60.0 in | Wt 194.0 lb

## 2018-12-16 DIAGNOSIS — Z13220 Encounter for screening for lipoid disorders: Secondary | ICD-10-CM

## 2018-12-16 DIAGNOSIS — F5104 Psychophysiologic insomnia: Secondary | ICD-10-CM

## 2018-12-16 DIAGNOSIS — Z79899 Other long term (current) drug therapy: Secondary | ICD-10-CM | POA: Diagnosis not present

## 2018-12-16 DIAGNOSIS — E119 Type 2 diabetes mellitus without complications: Secondary | ICD-10-CM | POA: Diagnosis not present

## 2018-12-16 MED ORDER — ESZOPICLONE 3 MG PO TABS: 3.0000 mg | ORAL_TABLET | Freq: Every day | ORAL | 5 refills | Status: DC

## 2018-12-16 MED ORDER — CITALOPRAM HYDROBROMIDE 20 MG PO TABS: 20.0000 mg | ORAL_TABLET | Freq: Every day | ORAL | 5 refills | Status: DC

## 2018-12-16 MED ORDER — EMPAGLIFLOZIN 10 MG PO TABS: 10.0000 mg | ORAL_TABLET | Freq: Every day | ORAL | 5 refills | Status: DC

## 2018-12-16 MED ORDER — PRAVASTATIN SODIUM 20 MG PO TABS: ORAL_TABLET | ORAL | 5 refills | Status: DC

## 2018-12-16 MED ORDER — METFORMIN HCL 500 MG PO TABS: ORAL_TABLET | ORAL | 5 refills | Status: DC

## 2018-12-16 NOTE — Patient Instructions (Addendum)
Results for orders placed or performed in visit on 08/14/18  Hemoglobin A1c  Result Value Ref Range   Hgb A1c MFr Bld 8.5 (H) 4.8 - 5.6 %   Est. average glucose Bld gHb Est-mCnc 197 mg/dL  Lipid Profile  Result Value Ref Range   Cholesterol, Total 179 100 - 199 mg/dL   Triglycerides 131 0 - 149 mg/dL   HDL 56 >39 mg/dL   VLDL Cholesterol Cal 26 5 - 40 mg/dL   LDL Calculated 97 0 - 99 mg/dL   Chol/HDL Ratio 3.2 0.0 - 4.4 ratio  Basic Metabolic Panel (BMET)  Result Value Ref Range   Glucose 199 (H) 65 - 99 mg/dL   BUN 15 6 - 24 mg/dL   Creatinine, Ser 0.69 0.57 - 1.00 mg/dL   GFR calc non Af Amer 97 >59 mL/min/1.73   GFR calc Af Amer 112 >59 mL/min/1.73   BUN/Creatinine Ratio 22 9 - 23   Sodium 138 134 - 144 mmol/L   Potassium 4.4 3.5 - 5.2 mmol/L   Chloride 99 96 - 106 mmol/L   CO2 23 20 - 29 mmol/L   Calcium 9.8 8.7 - 10.2 mg/dL  POCT HgB A1C  Result Value Ref Range   Hemoglobin A1C 8.5 (A) 4.0 - 5.6 %   HbA1c POC (<> result, manual entry)     HbA1c, POC (prediabetic range)     HbA1c, POC (controlled diabetic range)     Diabetes Mellitus and Nutrition, Adult When you have diabetes (diabetes mellitus), it is very important to have healthy eating habits because your blood sugar (glucose) levels are greatly affected by what you eat and drink. Eating healthy foods in the appropriate amounts, at about the same times every day, can help you:  Control your blood glucose.  Lower your risk of heart disease.  Improve your blood pressure.  Reach or maintain a healthy weight. Every person with diabetes is different, and each person has different needs for a meal plan. Your health care provider may recommend that you work with a diet and nutrition specialist (dietitian) to make a meal plan that is best for you. Your meal plan may vary depending on factors such as:  The calories you need.  The medicines you take.  Your weight.  Your blood glucose, blood pressure, and cholesterol  levels.  Your activity level.  Other health conditions you have, such as heart or kidney disease. How do carbohydrates affect me? Carbohydrates, also called carbs, affect your blood glucose level more than any other type of food. Eating carbs naturally raises the amount of glucose in your blood. Carb counting is a method for keeping track of how many carbs you eat. Counting carbs is important to keep your blood glucose at a healthy level, especially if you use insulin or take certain oral diabetes medicines. It is important to know how many carbs you can safely have in each meal. This is different for every person. Your dietitian can help you calculate how many carbs you should have at each meal and for each snack. Foods that contain carbs include:  Bread, cereal, rice, pasta, and crackers.  Potatoes and corn.  Peas, beans, and lentils.  Milk and yogurt.  Fruit and juice.  Desserts, such as cakes, cookies, ice cream, and candy. How does alcohol affect me? Alcohol can cause a sudden decrease in blood glucose (hypoglycemia), especially if you use insulin or take certain oral diabetes medicines. Hypoglycemia can be a life-threatening condition. Symptoms of  hypoglycemia (sleepiness, dizziness, and confusion) are similar to symptoms of having too much alcohol. If your health care provider says that alcohol is safe for you, follow these guidelines:  Limit alcohol intake to no more than 1 drink per day for nonpregnant women and 2 drinks per day for men. One drink equals 12 oz of beer, 5 oz of wine, or 1 oz of hard liquor.  Do not drink on an empty stomach.  Keep yourself hydrated with water, diet soda, or unsweetened iced tea.  Keep in mind that regular soda, juice, and other mixers may contain a lot of sugar and must be counted as carbs. What are tips for following this plan?  Reading food labels  Start by checking the serving size on the "Nutrition Facts" label of packaged foods and  drinks. The amount of calories, carbs, fats, and other nutrients listed on the label is based on one serving of the item. Many items contain more than one serving per package.  Check the total grams (g) of carbs in one serving. You can calculate the number of servings of carbs in one serving by dividing the total carbs by 15. For example, if a food has 30 g of total carbs, it would be equal to 2 servings of carbs.  Check the number of grams (g) of saturated and trans fats in one serving. Choose foods that have low or no amount of these fats.  Check the number of milligrams (mg) of salt (sodium) in one serving. Most people should limit total sodium intake to less than 2,300 mg per day.  Always check the nutrition information of foods labeled as "low-fat" or "nonfat". These foods may be higher in added sugar or refined carbs and should be avoided.  Talk to your dietitian to identify your daily goals for nutrients listed on the label. Shopping  Avoid buying canned, premade, or processed foods. These foods tend to be high in fat, sodium, and added sugar.  Shop around the outside edge of the grocery store. This includes fresh fruits and vegetables, bulk grains, fresh meats, and fresh dairy. Cooking  Use low-heat cooking methods, such as baking, instead of high-heat cooking methods like deep frying.  Cook using healthy oils, such as olive, canola, or sunflower oil.  Avoid cooking with butter, cream, or high-fat meats. Meal planning  Eat meals and snacks regularly, preferably at the same times every day. Avoid going long periods of time without eating.  Eat foods high in fiber, such as fresh fruits, vegetables, beans, and whole grains. Talk to your dietitian about how many servings of carbs you can eat at each meal.  Eat 4-6 ounces (oz) of lean protein each day, such as lean meat, chicken, fish, eggs, or tofu. One oz of lean protein is equal to: ? 1 oz of meat, chicken, or fish. ? 1 egg. ?   cup of tofu.  Eat some foods each day that contain healthy fats, such as avocado, nuts, seeds, and fish. Lifestyle  Check your blood glucose regularly.  Exercise regularly as told by your health care provider. This may include: ? 150 minutes of moderate-intensity or vigorous-intensity exercise each week. This could be brisk walking, biking, or water aerobics. ? Stretching and doing strength exercises, such as yoga or weightlifting, at least 2 times a week.  Take medicines as told by your health care provider.  Do not use any products that contain nicotine or tobacco, such as cigarettes and e-cigarettes. If you need help  quitting, ask your health care provider.  Work with a Social worker or diabetes educator to identify strategies to manage stress and any emotional and social challenges. Questions to ask a health care provider  Do I need to meet with a diabetes educator?  Do I need to meet with a dietitian?  What number can I call if I have questions?  When are the best times to check my blood glucose? Where to find more information:  American Diabetes Association: diabetes.org  Academy of Nutrition and Dietetics: www.eatright.[employer] of Diabetes and Digestive and Kidney Diseases (NIH): [employer] Summary  A healthy meal plan will help you control your blood glucose and maintain a healthy lifestyle.  Working with a diet and nutrition specialist (dietitian) can help you make a meal plan that is best for you.  Keep in mind that carbohydrates (carbs) and alcohol have immediate effects on your blood glucose levels. It is important to count carbs and to use alcohol carefully. This information is not intended to replace advice given to you by your health care provider. Make sure you discuss any questions you have with your health care provider. Document Released: 06/27/2005 Document Revised: 04/30/2017 Document Reviewed: 11/04/2016 Elsevier Interactive Patient  Education  2019 [employer].

## 2018-12-16 NOTE — Progress Notes (Signed)
Subjective:    Patient ID: Molly Ryan, female    DOB: 06/05/1961, 58 y.o.   MRN: [id number]  Diabetes  She presents for her follow-up diabetic visit. She has type 2 diabetes mellitus. Pertinent negatives for hypoglycemia include no confusion or dizziness. Pertinent negatives for diabetes include no chest pain, no fatigue, no polydipsia, no polyphagia and no weakness. She is compliant with treatment all of the time. Home blood sugar record trend: sugar running all over the place. She does not see a podiatrist.Eye exam is current.  A1C on bloodwork 8.5  Lunesta not helping much anymore. Has been on it for a long time.  We did have a good discussion about insomnia current day recommendations of staying away from medicines patient feels that she cannot get by without medicines would like to try higher dose Lunesta does not want to try other medications currently  We also had a long discussion about how problems with not being on statins increase the risk of heart disease by 40% with diabetics and encourage her to be on medicines rationale discussed  Review of Systems  Constitutional: Negative for activity change, appetite change and fatigue.  HENT: Negative for congestion and rhinorrhea.   Respiratory: Negative for cough and shortness of breath.   Cardiovascular: Negative for chest pain and leg swelling.  Gastrointestinal: Negative for abdominal pain and diarrhea.  Endocrine: Negative for polydipsia and polyphagia.  Skin: Negative for color change.  Neurological: Negative for dizziness and weakness.  Psychiatric/Behavioral: Negative for behavioral problems and confusion.       Objective:   Physical Exam Vitals signs and nursing note reviewed.  Constitutional:      Appearance: She is well-developed.  HENT:     Head: Normocephalic.     Nose: Nose normal.     Mouth/Throat:     Pharynx: No oropharyngeal exudate.  Neck:     Musculoskeletal: Neck supple.  Cardiovascular:   Rate and Rhythm: Normal rate.     Heart sounds: Normal heart sounds. No murmur.  Pulmonary:     Effort: Pulmonary effort is normal.     Breath sounds: Normal breath sounds. No wheezing.  Lymphadenopathy:     Cervical: No cervical adenopathy.  Skin:    General: Skin is warm and dry.           Assessment & Plan:  Diabetes subpar control various options were discussed we will try Jardiance change in her other medicine to plain metformin patient to do lab work in 3 months patient to follow-up if any problems or low sugars  To lower risk of heart disease recommend low-dose statin 3 days/week  Moods overall doing well on citalopram continue these for now   Insomnia we discussed various options she would like to continue medicine but would like to have a higher dose therefore used 3 mg Lunesta if it causes any trouble stop medicine let us know follow-up  Otherwise do lab work in 3 months with follow-up office visit in 5 to 6 months work hard at watching diet watching portions making good healthy choices exercising and losing weight if sugars are not improving long-acting insulin next step  25 minutes was spent with the patient.  This statement verifies that 25 minutes was indeed spent with the patient.  More than 50% of this visit-total duration of the visit-was spent in counseling and coordination of care. The issues that the patient came in for today as reflected in the diagnosis (s) please refer to  documentation for further details.

## 2019-04-01 ENCOUNTER — Other Ambulatory Visit: Payer: Self-pay | Admitting: *Deleted

## 2019-04-01 ENCOUNTER — Telehealth: Payer: Self-pay | Admitting: Family Medicine

## 2019-04-01 MED ORDER — MECLIZINE HCL 25 MG PO TABS: ORAL_TABLET | ORAL | 1 refills | Status: DC

## 2019-04-01 NOTE — Telephone Encounter (Signed)
Discussed with pt. Pt verbalized understanding. Med sent to pharm.  

## 2019-04-01 NOTE — Telephone Encounter (Signed)
Feels like room is spinning. Started yesterday. Felt like she was on a teacup ride. Took benadryl and it helped a little bit. Feels like she is walking on air like her feet are not touching ground. Blood sugar today 116. Has a little headache, feels fatigued. yesterday states she could not move and today she is at work. No fever. Took temp yesterday and this morning. 98.2 today.

## 2019-04-01 NOTE — Telephone Encounter (Signed)
Based on the triage It does sound like this is more than likely vertigo symptoms Meclizine 25 mg, 1 every 6 hours as needed dizziness, caution drowsiness, #30, 1 refill  Most individuals will see gradual improvement of dizziness over a course of 24 to 48 hours but sometimes can take 3 or 4 days to clear out  Certainly if the patient starts having unilateral numbness or weakness severe ataxia may need to go to ER for further evaluation for possible stroke If dizziness does not clear up over the next several days follow-up office visit advised

## 2019-04-01 NOTE — Telephone Encounter (Signed)
Please give work excuse for today and tomorrow and fax to [phone] atten Annye English

## 2019-04-01 NOTE — Telephone Encounter (Signed)
Patient experiencing Vertigo. Had it yesterday, today is better but still having some dizziness that is making her nauseated. Has checked her sugar and it is okay. Wanted to know if there is something we can call in for dizziness?   She is at work today [phone].

## 2019-04-02 ENCOUNTER — Encounter: Payer: Self-pay | Admitting: Family Medicine

## 2019-04-02 NOTE — Telephone Encounter (Signed)
Called pt, explained that work excuse was ready & faxed to [phone] attn: Annye English  Pt verbalized understanding

## 2019-05-26 ENCOUNTER — Other Ambulatory Visit: Payer: Self-pay

## 2019-05-26 ENCOUNTER — Ambulatory Visit (INDEPENDENT_AMBULATORY_CARE_PROVIDER_SITE_OTHER): Payer: 59 | Admitting: Family Medicine

## 2019-05-26 DIAGNOSIS — R42 Dizziness and giddiness: Secondary | ICD-10-CM

## 2019-05-26 MED ORDER — ONDANSETRON HCL 8 MG PO TABS: 8.0000 mg | ORAL_TABLET | Freq: Three times a day (TID) | ORAL | 1 refills | Status: DC | PRN

## 2019-05-26 NOTE — Progress Notes (Signed)
   Subjective:    Patient ID: Molly Ryan, female    DOB: 1960/11/21, 58 y.o.   MRN: [id number]  HPI Patient calls with vertigo off and on for a few months. Vertigo will last up to a few hours and her right ear and teeth hurt during the spells. Patient has had intermittent vertigo Happened a few months ago Happened yesterday Yesterday she had to sit down she felt dizzy she fell lightheaded and she checked her sugar it was up near 200 she denies any other trouble denies unilateral numbness weakness denies nausea vomiting diarrhea PMH benign Virtual Visit via Video Note  I connected with Liliane Shi on 05/26/19 at 11:00 AM EDT by a video enabled telemedicine application and verified that I am speaking with the correct person using two identifiers.  Location: Patient: home Provider: office   I discussed the limitations of evaluation and management by telemedicine and the availability of in person appointments. The patient expressed understanding and agreed to proceed.  History of Present Illness:    Observations/Objective:   Assessment and Plan:   Follow Up Instructions:    I discussed the assessment and treatment plan with the patient. The patient was provided an opportunity to ask questions and all were answered. The patient agreed with the plan and demonstrated an understanding of the instructions.   The patient was advised to call back or seek an in-person evaluation if the symptoms worsen or if the condition fails to improve as anticipated.  I provided 16 minutes of non-face-to-face time during this encounter.     Review of Systems  Constitutional: Negative for activity change and appetite change.  HENT: Negative for congestion and rhinorrhea.   Respiratory: Negative for cough and shortness of breath.   Cardiovascular: Negative for chest pain and leg swelling.  Gastrointestinal: Negative for abdominal pain, nausea and vomiting.  Skin: Negative for color  change.  Neurological: Negative for dizziness and weakness.  Psychiatric/Behavioral: Negative for agitation and confusion.       Objective:   Physical Exam Vitals signs reviewed.  Constitutional:      Appearance: She is well-developed.  HENT:     Head: Normocephalic.  Cardiovascular:     Rate and Rhythm: Normal rate and regular rhythm.     Heart sounds: Normal heart sounds. No murmur.  Pulmonary:     Effort: Pulmonary effort is normal.     Breath sounds: Normal breath sounds.  Skin:    General: Skin is warm and dry.  Neurological:     Mental Status: She is alert.   No nystagmus finger-to-nose normal negative Romberg can walk a straight line no unilateral numbness weakness        Assessment & Plan:  I do not feel the patient has had a stroke I think it is more likely interfere but if it persists over the course of the next several days without getting better may need neurologic consultation May use meclizine but can cause drowsiness it is better for her to do Epley maneuver information was given regarding this.  15 minutes was spent with patient today discussing healthcare issues which they came.  More than 50% of this visit-total duration of visit-was spent in counseling and coordination of care.  Please see diagnosis regarding the focus of this coordination and care   Patient will do her lab work regarding chronic health visit she has a visit coming up in October

## 2019-05-26 NOTE — Patient Instructions (Signed)
Please do your labs this month  Try the Epley exercises  Let me know if not better within 7 days

## 2019-06-01 ENCOUNTER — Other Ambulatory Visit (HOSPITAL_COMMUNITY): Payer: Self-pay | Admitting: Family Medicine

## 2019-06-01 DIAGNOSIS — Z1231 Encounter for screening mammogram for malignant neoplasm of breast: Secondary | ICD-10-CM

## 2019-06-06 ENCOUNTER — Other Ambulatory Visit: Payer: Self-pay | Admitting: Family Medicine

## 2019-06-10 LAB — LIPID PANEL
Chol/HDL Ratio: 3.5 ratio (ref 0.0–4.4)
Cholesterol, Total: 205 mg/dL — ABNORMAL HIGH (ref 100–199)
HDL: 59 mg/dL (ref >39)
LDL Calculated: 108 mg/dL — ABNORMAL HIGH (ref 0–99)
Triglycerides: 190 mg/dL — ABNORMAL HIGH (ref 0–149)
VLDL Cholesterol Cal: 38 mg/dL (ref 5–40)

## 2019-06-10 LAB — BASIC METABOLIC PANEL
BUN/Creatinine Ratio: 17 (ref 9–23)
BUN: 14 mg/dL (ref 6–24)
CO2: 24 mmol/L (ref 20–29)
Calcium: 9.6 mg/dL (ref 8.7–10.2)
Chloride: 97 mmol/L (ref 96–106)
Creatinine, Ser: 0.83 mg/dL (ref 0.57–1.00)
GFR calc Af Amer: 91 mL/min/{1.73_m2} (ref >59)
GFR calc non Af Amer: 79 mL/min/{1.73_m2} (ref >59)
Glucose: 169 mg/dL — ABNORMAL HIGH (ref 65–99)
Potassium: 3.9 mmol/L (ref 3.5–5.2)
Sodium: 140 mmol/L (ref 134–144)

## 2019-06-10 LAB — MICROALBUMIN / CREATININE URINE RATIO
Creatinine, Urine: 49.1 mg/dL
Microalb/Creat Ratio: <6 mg/g creat (ref 0–29)
Microalbumin, Urine: <3 ug/mL

## 2019-06-10 LAB — HEPATIC FUNCTION PANEL
ALT: 22 IU/L (ref 0–32)
AST: 16 IU/L (ref 0–40)
Albumin: 4.9 g/dL (ref 3.8–4.9)
Alkaline Phosphatase: 71 IU/L (ref 39–117)
Bilirubin Total: 0.3 mg/dL (ref 0.0–1.2)
Bilirubin, Direct: 0.09 mg/dL (ref 0.00–0.40)
Total Protein: 7.4 g/dL (ref 6.0–8.5)

## 2019-06-10 LAB — HEMOGLOBIN A1C
Est. average glucose Bld gHb Est-mCnc: 180 mg/dL
Hgb A1c MFr Bld: 7.9 % — ABNORMAL HIGH (ref 4.8–5.6)

## 2019-06-14 ENCOUNTER — Other Ambulatory Visit: Payer: Self-pay | Admitting: Family Medicine

## 2019-06-16 ENCOUNTER — Telehealth: Payer: Self-pay | Admitting: *Deleted

## 2019-06-16 ENCOUNTER — Other Ambulatory Visit: Payer: Self-pay | Admitting: *Deleted

## 2019-06-16 DIAGNOSIS — Z13220 Encounter for screening for lipoid disorders: Secondary | ICD-10-CM

## 2019-06-16 DIAGNOSIS — E119 Type 2 diabetes mellitus without complications: Secondary | ICD-10-CM

## 2019-06-16 DIAGNOSIS — Z79899 Other long term (current) drug therapy: Secondary | ICD-10-CM

## 2019-06-16 MED ORDER — EMPAGLIFLOZIN 25 MG PO TABS: 25.0000 mg | ORAL_TABLET | Freq: Every day | ORAL | 5 refills | Status: DC

## 2019-06-16 MED ORDER — PRAVASTATIN SODIUM 40 MG PO TABS: ORAL_TABLET | ORAL | 5 refills | Status: DC

## 2019-06-16 NOTE — Telephone Encounter (Signed)
Please cancel appt in October. Pt is aware appt is being canceled. thanks

## 2019-06-16 NOTE — Telephone Encounter (Signed)
-----  Message from Kathyrn Drown, MD sent at 06/10/2019  2:03 PM EDT ----- Patient did do her blood work-please talk to the patient regarding these recommendations Her A1c is 7.9 it is not as bad as what it was but it is still elevated higher than what we would like LDL is above 100 with the goal to be less than 70 to reduce risk of heart attack I would recommend the following Increase Jardiance new dose 25 mg daily, #30, 4 refills Increase pravastatin new dose of 40 mg, #90, 4 refills The patient was supposed to be following up for her chronic health issues in October but to me it makes sense for her to do these above changes then do repeat lab work in approximately 3 months with a follow-up office visit or virtual visit at that time I recommend met 7, A1c, lipid to be done in 3 months with follow-up office or virtual visit

## 2019-06-17 NOTE — Telephone Encounter (Signed)
Patient October appointment has been cancelled.

## 2019-07-23 ENCOUNTER — Ambulatory Visit: Payer: 59 | Admitting: Family Medicine

## 2019-11-09 ENCOUNTER — Other Ambulatory Visit: Payer: Self-pay | Admitting: Family Medicine

## 2019-11-09 NOTE — Addendum Note (Signed)
Addended by: Dairl Ponder on: 11/09/2019 04:43 PM   Modules accepted: Orders

## 2019-11-09 NOTE — Telephone Encounter (Signed)
May have this +2 refills follow-up in the spring may pend then send back to me thank you

## 2019-11-09 NOTE — Telephone Encounter (Signed)
Fax from [facility] requesting refill on Eszopiclone 3 mg tablets. Take one tablet po at bedtime. Pt last seen 05/26/2019 for vertigo. Please advise. Thank you

## 2019-11-10 MED ORDER — ESZOPICLONE 3 MG PO TABS: 3.0000 mg | ORAL_TABLET | Freq: Every day | ORAL | 2 refills | Status: DC

## 2019-11-10 NOTE — Addendum Note (Signed)
Addended by: Sallee Lange A on: 11/10/2019 03:35 PM   Modules accepted: Orders

## 2019-11-29 ENCOUNTER — Telehealth: Payer: Self-pay | Admitting: Family Medicine

## 2019-11-29 DIAGNOSIS — E785 Hyperlipidemia, unspecified: Secondary | ICD-10-CM

## 2019-11-29 DIAGNOSIS — Z79899 Other long term (current) drug therapy: Secondary | ICD-10-CM

## 2019-11-29 DIAGNOSIS — E119 Type 2 diabetes mellitus without complications: Secondary | ICD-10-CM

## 2019-11-29 DIAGNOSIS — E1169 Type 2 diabetes mellitus with other specified complication: Secondary | ICD-10-CM

## 2019-11-29 MED ORDER — METFORMIN HCL 500 MG PO TABS: ORAL_TABLET | ORAL | 0 refills | Status: DC

## 2019-11-29 NOTE — Telephone Encounter (Signed)
Medication sent to pharmacy and lab orders placed. Mailed to patient as a reminder

## 2019-11-29 NOTE — Telephone Encounter (Signed)
[facility] on [address] requesting refill on Metformin 500 mg tablets. Take 2 tablets po twice daily. Pt last seen 05/26/2019 for Vertigo. Please advise. Thank you

## 2019-11-29 NOTE — Addendum Note (Signed)
Addended by: Vicente Males on: 11/29/2019 04:16 PM   Modules accepted: Orders

## 2019-11-29 NOTE — Telephone Encounter (Signed)
May have 1 refill Needs A1c lipid liver metabolic 7 Needs to do a follow-up within 30 days in person or virtual

## 2019-12-03 ENCOUNTER — Telehealth: Payer: Self-pay | Admitting: Family Medicine

## 2019-12-03 NOTE — Telephone Encounter (Signed)
[facility] on [address] requesting refill on Pravastatin 40 mg tablets. Pt last seen 05/26/2019 for vertigo. Please advise. Thank you

## 2019-12-05 NOTE — Telephone Encounter (Signed)
May have 1 month with 2 refills recommend follow-up office visit this spring

## 2019-12-06 NOTE — Telephone Encounter (Signed)
Please schedule and then route back to nurses to send in refill °

## 2019-12-07 ENCOUNTER — Other Ambulatory Visit: Payer: Self-pay | Admitting: *Deleted

## 2019-12-07 MED ORDER — PRAVASTATIN SODIUM 40 MG PO TABS: ORAL_TABLET | ORAL | 2 refills | Status: DC

## 2019-12-07 NOTE — Telephone Encounter (Signed)
Patient schedule 6 month follow up visit on 3/22

## 2019-12-07 NOTE — Telephone Encounter (Signed)
Refills sent

## 2019-12-08 ENCOUNTER — Other Ambulatory Visit: Payer: Self-pay | Admitting: *Deleted

## 2019-12-08 MED ORDER — CITALOPRAM HYDROBROMIDE 20 MG PO TABS: ORAL_TABLET | ORAL | 0 refills | Status: DC

## 2019-12-15 ENCOUNTER — Telehealth: Payer: Self-pay | Admitting: Family Medicine

## 2019-12-15 MED ORDER — EMPAGLIFLOZIN 25 MG PO TABS: 25.0000 mg | ORAL_TABLET | Freq: Every day | ORAL | 0 refills | Status: DC

## 2019-12-15 NOTE — Telephone Encounter (Signed)
Prescription sent electronically to pharmacy. 

## 2019-12-15 NOTE — Telephone Encounter (Signed)
[facility] [address] requesting refill on Jardiance 25 mg tablet. Take one tablet po before breakfast. Last seen 05/26/2019 for Vertiogo; has upcoming appt on 01/03/2020. Please advise. Thank you

## 2019-12-15 NOTE — Telephone Encounter (Signed)
1 refill 

## 2019-12-18 LAB — BASIC METABOLIC PANEL
BUN/Creatinine Ratio: 16 (ref 9–23)
BUN: 12 mg/dL (ref 6–24)
CO2: 25 mmol/L (ref 20–29)
Calcium: 10.3 mg/dL — ABNORMAL HIGH (ref 8.7–10.2)
Chloride: 102 mmol/L (ref 96–106)
Creatinine, Ser: 0.77 mg/dL (ref 0.57–1.00)
GFR calc Af Amer: 98 mL/min/{1.73_m2} (ref >59)
GFR calc non Af Amer: 85 mL/min/{1.73_m2} (ref >59)
Glucose: 186 mg/dL — ABNORMAL HIGH (ref 65–99)
Potassium: 4.5 mmol/L (ref 3.5–5.2)
Sodium: 143 mmol/L (ref 134–144)

## 2019-12-18 LAB — LIPID PANEL
Chol/HDL Ratio: 2.8 ratio (ref 0.0–4.4)
Cholesterol, Total: 165 mg/dL (ref 100–199)
HDL: 59 mg/dL (ref >39)
LDL Chol Calc (NIH): 87 mg/dL (ref 0–99)
Triglycerides: 108 mg/dL (ref 0–149)
VLDL Cholesterol Cal: 19 mg/dL (ref 5–40)

## 2019-12-18 LAB — HEPATIC FUNCTION PANEL
ALT: 19 IU/L (ref 0–32)
AST: 18 IU/L (ref 0–40)
Albumin: 4.7 g/dL (ref 3.8–4.9)
Alkaline Phosphatase: 74 IU/L (ref 39–117)
Bilirubin Total: 0.4 mg/dL (ref 0.0–1.2)
Bilirubin, Direct: 0.14 mg/dL (ref 0.00–0.40)
Total Protein: 7.2 g/dL (ref 6.0–8.5)

## 2019-12-18 LAB — HEMOGLOBIN A1C
Est. average glucose Bld gHb Est-mCnc: 206 mg/dL
Hgb A1c MFr Bld: 8.8 % — ABNORMAL HIGH (ref 4.8–5.6)

## 2020-01-03 ENCOUNTER — Encounter: Payer: Self-pay | Admitting: Family Medicine

## 2020-01-03 ENCOUNTER — Other Ambulatory Visit: Payer: Self-pay

## 2020-01-03 ENCOUNTER — Ambulatory Visit: Payer: 59 | Admitting: Family Medicine

## 2020-01-03 VITALS — BP 118/76 | Temp 97.5°F | Ht 60.0 in | Wt 186.0 lb

## 2020-01-03 DIAGNOSIS — E1169 Type 2 diabetes mellitus with other specified complication: Secondary | ICD-10-CM | POA: Diagnosis not present

## 2020-01-03 DIAGNOSIS — E785 Hyperlipidemia, unspecified: Secondary | ICD-10-CM | POA: Diagnosis not present

## 2020-01-03 DIAGNOSIS — E119 Type 2 diabetes mellitus without complications: Secondary | ICD-10-CM

## 2020-01-03 DIAGNOSIS — Z79899 Other long term (current) drug therapy: Secondary | ICD-10-CM | POA: Diagnosis not present

## 2020-01-03 MED ORDER — CITALOPRAM HYDROBROMIDE 20 MG PO TABS: ORAL_TABLET | ORAL | 5 refills | Status: DC

## 2020-01-03 MED ORDER — METFORMIN HCL 500 MG PO TABS: ORAL_TABLET | ORAL | 5 refills | Status: DC

## 2020-01-03 MED ORDER — PRAVASTATIN SODIUM 40 MG PO TABS: ORAL_TABLET | ORAL | 5 refills | Status: DC

## 2020-01-03 MED ORDER — TRULICITY 0.75 MG/0.5ML ~~LOC~~ SOAJ: 0.7500 mg | SUBCUTANEOUS | 0 refills | Status: DC

## 2020-01-03 MED ORDER — ESZOPICLONE 3 MG PO TABS: 3.0000 mg | ORAL_TABLET | Freq: Every day | ORAL | 5 refills | Status: DC

## 2020-01-03 MED ORDER — EMPAGLIFLOZIN 25 MG PO TABS: 25.0000 mg | ORAL_TABLET | Freq: Every day | ORAL | 5 refills | Status: DC

## 2020-01-03 NOTE — Progress Notes (Signed)
Subjective:    Patient ID: Molly Ryan, female    DOB: [birth year], 59 y.o.   MRN: [id number]  Diabetes She presents for her follow-up diabetic visit. She has type 2 diabetes mellitus. Pertinent negatives for hypoglycemia include no confusion or dizziness. Pertinent negatives for diabetes include no chest pain, no fatigue, no polydipsia, no polyphagia and no weakness. She is compliant with treatment all of the time. Exercise: only work. Home blood sugar record trend: 180 -200. She does not see a podiatrist.Eye exam is current (has upcoming appt in june).   A1C 8.8 done on bw on march 5th.   Pt states no concerns today.  Results for orders placed or performed in visit on 11/29/19  Hemoglobin A1c  Result Value Ref Range   Hgb A1c MFr Bld 8.8 (H) 4.8 - 5.6 %   Est. average glucose Bld gHb Est-mCnc 206 mg/dL  Lipid Profile  Result Value Ref Range   Cholesterol, Total 165 100 - 199 mg/dL   Triglycerides 108 0 - 149 mg/dL   HDL 59 >39 mg/dL   VLDL Cholesterol Cal 19 5 - 40 mg/dL   LDL Chol Calc (NIH) 87 0 - 99 mg/dL   Chol/HDL Ratio 2.8 0.0 - 4.4 ratio  Hepatic function panel  Result Value Ref Range   Total Protein 7.2 6.0 - 8.5 g/dL   Albumin 4.7 3.8 - 4.9 g/dL   Bilirubin Total 0.4 0.0 - 1.2 mg/dL   Bilirubin, Direct 0.14 0.00 - 0.40 mg/dL   Alkaline Phosphatase 74 39 - 117 IU/L   AST 18 0 - 40 IU/L   ALT 19 0 - 32 IU/L  Basic Metabolic Panel (BMET)  Result Value Ref Range   Glucose 186 (H) 65 - 99 mg/dL   BUN 12 6 - 24 mg/dL   Creatinine, Ser 0.77 0.57 - 1.00 mg/dL   GFR calc non Af Amer 85 >59 mL/min/1.73   GFR calc Af Amer 98 >59 mL/min/1.73   BUN/Creatinine Ratio 16 9 - 23   Sodium 143 134 - 144 mmol/L   Potassium 4.5 3.5 - 5.2 mmol/L   Chloride 102 96 - 106 mmol/L   CO2 25 20 - 29 mmol/L   Calcium 10.3 (H) 8.7 - 10.2 mg/dL  Review of Systems  Constitutional: Negative for activity change, appetite change and fatigue.  HENT: Negative for congestion and rhinorrhea.    Respiratory: Negative for cough and shortness of breath.   Cardiovascular: Negative for chest pain and leg swelling.  Gastrointestinal: Negative for abdominal pain and diarrhea.  Endocrine: Negative for polydipsia and polyphagia.  Skin: Negative for color change.  Neurological: Negative for dizziness and weakness.  Psychiatric/Behavioral: Negative for behavioral problems and confusion.       Objective:   Physical Exam Vitals reviewed.  Constitutional:      General: She is not in acute distress. HENT:     Head: Normocephalic and atraumatic.  Eyes:     General:        Right eye: No discharge.        Left eye: No discharge.  Neck:     Trachea: No tracheal deviation.  Cardiovascular:     Rate and Rhythm: Normal rate and regular rhythm.     Heart sounds: Normal heart sounds. No murmur.  Pulmonary:     Effort: Pulmonary effort is normal. No respiratory distress.     Breath sounds: Normal breath sounds.  Lymphadenopathy:     Cervical: No cervical adenopathy.  Skin:    General: Skin is warm and dry.  Neurological:     Mental Status: She is alert.     Coordination: Coordination normal.  Psychiatric:        Behavior: Behavior normal.           Assessment & Plan:  1. Type 2 diabetes mellitus without complication, without long-term current use of insulin (HCC) Not under good control.  A1c too high.  Patient eating way too many sweets as well as simple sugars.  Very important for patient for diet become more active.  Addition to this we will relook at A1c again in a few months follow-up in 6 months also go ahead and add Trulicity Patient did have problems with nausea with Bydureon hopefully she will not have that with Trulicity but if she does we will take a different approach she will check her sugars intermittently and send [place] some readings proper goals were discussed with the patient - Hemoglobin A1c - Hemoglobin 123456 - Basic Metabolic Panel (BMET) - Lipid Profile  2.  Hyperlipidemia associated with type 2 diabetes mellitus (HCC) LDL is above where we would like to see but her diet has not been good stick with the current medicines work much harder on diet this was discussed with the patient - Lipid panel - Hemoglobin 123456 - Basic Metabolic Panel (BMET) - Lipid Profile  3. Diabetes mellitus without complication ([place]) Subpar control very important to get it under control with adding medication diet activity losing weight - Hemoglobin A1c - Hemoglobin 123456 - Basic Metabolic Panel (BMET) - Lipid Profile  4. High risk medication use Patient also has insomnia and was given refills of her medication. - Basic metabolic panel - Hemoglobin 123456 - Basic Metabolic Panel (BMET) - Lipid Profile Patient with moderate obesity with underlying comorbidities qualifies as morbid obesity very important for patient to watch portions stay active try to lose weight Patient states she is taking her Celexa as directed and states her moods been under good control

## 2020-01-03 NOTE — Patient Instructions (Addendum)
Diabetes Mellitus and Nutrition, Adult When you have diabetes (diabetes mellitus), it is very important to have healthy eating habits because your blood sugar (glucose) levels are greatly affected by what you eat and drink. Eating healthy foods in the appropriate amounts, at about the same times every day, can help you:  Control your blood glucose.  Lower your risk of heart disease.  Improve your blood pressure.  Reach or maintain a healthy weight. Every person with diabetes is different, and each person has different needs for a meal plan. Your health care provider may recommend that you work with a diet and nutrition specialist (dietitian) to make a meal plan that is best for you. Your meal plan may vary depending on factors such as:  The calories you need.  The medicines you take.  Your weight.  Your blood glucose, blood pressure, and cholesterol levels.  Your activity level.  Other health conditions you have, such as heart or kidney disease. How do carbohydrates affect me? Carbohydrates, also called carbs, affect your blood glucose level more than any other type of food. Eating carbs naturally raises the amount of glucose in your blood. Carb counting is a method for keeping track of how many carbs you eat. Counting carbs is important to keep your blood glucose at a healthy level, especially if you use insulin or take certain oral diabetes medicines. It is important to know how many carbs you can safely have in each meal. This is different for every person. Your dietitian can help you calculate how many carbs you should have at each meal and for each snack. Foods that contain carbs include:  Bread, cereal, rice, pasta, and crackers.  Potatoes and corn.  Peas, beans, and lentils.  Milk and yogurt.  Fruit and juice.  Desserts, such as cakes, cookies, ice cream, and candy. How does alcohol affect me? Alcohol can cause a sudden decrease in blood glucose (hypoglycemia),  especially if you use insulin or take certain oral diabetes medicines. Hypoglycemia can be a life-threatening condition. Symptoms of hypoglycemia (sleepiness, dizziness, and confusion) are similar to symptoms of having too much alcohol. If your health care provider says that alcohol is safe for you, follow these guidelines:  Limit alcohol intake to no more than 1 drink per day for nonpregnant women and 2 drinks per day for men. One drink equals 12 oz of beer, 5 oz of wine, or 1 oz of hard liquor.  Do not drink on an empty stomach.  Keep yourself hydrated with water, diet soda, or unsweetened iced tea.  Keep in mind that regular soda, juice, and other mixers may contain a lot of sugar and must be counted as carbs. What are tips for following this plan?  Reading food labels  Start by checking the serving size on the "Nutrition Facts" label of packaged foods and drinks. The amount of calories, carbs, fats, and other nutrients listed on the label is based on one serving of the item. Many items contain more than one serving per package.  Check the total grams (g) of carbs in one serving. You can calculate the number of servings of carbs in one serving by dividing the total carbs by 15. For example, if a food has 30 g of total carbs, it would be equal to 2 servings of carbs.  Check the number of grams (g) of saturated and trans fats in one serving. Choose foods that have low or no amount of these fats.  Check the number of  milligrams (mg) of salt (sodium) in one serving. Most people should limit total sodium intake to less than 2,300 mg per day.  Always check the nutrition information of foods labeled as "low-fat" or "nonfat". These foods may be higher in added sugar or refined carbs and should be avoided.  Talk to your dietitian to identify your daily goals for nutrients listed on the label. Shopping  Avoid buying canned, premade, or processed foods. These foods tend to be high in fat, sodium,  and added sugar.  Shop around the outside edge of the grocery store. This includes fresh fruits and vegetables, bulk grains, fresh meats, and fresh dairy. Cooking  Use low-heat cooking methods, such as baking, instead of high-heat cooking methods like deep frying.  Cook using healthy oils, such as olive, canola, or sunflower oil.  Avoid cooking with butter, cream, or high-fat meats. Meal planning  Eat meals and snacks regularly, preferably at the same times every day. Avoid going long periods of time without eating.  Eat foods high in fiber, such as fresh fruits, vegetables, beans, and whole grains. Talk to your dietitian about how many servings of carbs you can eat at each meal.  Eat 4-6 ounces (oz) of lean protein each day, such as lean meat, chicken, fish, eggs, or tofu. One oz of lean protein is equal to: ? 1 oz of meat, chicken, or fish. ? 1 egg. ?  cup of tofu.  Eat some foods each day that contain healthy fats, such as avocado, nuts, seeds, and fish. Lifestyle  Check your blood glucose regularly.  Exercise regularly as told by your health care provider. This may include: ? 150 minutes of moderate-intensity or vigorous-intensity exercise each week. This could be brisk walking, biking, or water aerobics. ? Stretching and doing strength exercises, such as yoga or weightlifting, at least 2 times a week.  Take medicines as told by your health care provider.  Do not use any products that contain nicotine or tobacco, such as cigarettes and e-cigarettes. If you need help quitting, ask your health care provider.  Work with a Social worker or diabetes educator to identify strategies to manage stress and any emotional and social challenges. Questions to ask a health care provider  Do I need to meet with a diabetes educator?  Do I need to meet with a dietitian?  What number can I call if I have questions?  When are the best times to check my blood glucose? Where to find more  information:  American Diabetes Association: diabetes.org  Academy of Nutrition and Dietetics: www.eatright.[employer] of Diabetes and Digestive and Kidney Diseases (NIH): [employer] Summary  A healthy meal plan will help you control your blood glucose and maintain a healthy lifestyle.  Working with a diet and nutrition specialist (dietitian) can help you make a meal plan that is best for you.  Keep in mind that carbohydrates (carbs) and alcohol have immediate effects on your blood glucose levels. It is important to count carbs and to use alcohol carefully. This information is not intended to replace advice given to you by your health care provider. Make sure you discuss any questions you have with your health care provider. Document Revised: 09/12/2017 Document Reviewed: 11/04/2016 Elsevier Patient Education  [employer].  Dear Patient,  It has been recommended to you that you have a colonoscopy. It is your responsibility to carry through with this recommendation.   Did you realize that colon cancer is the second leading cancer killer  in the [place]. One in every 20 adults will get colon cancer. If all adults would go through the recommended screening for colon cancer (getting a colonoscopy), then there would be a 65% reduction in the number of people dying from colon cancer.  Colon cancer just doesn't come out of the blue. It starts off as a small polyp which over time grows into a cancer. A colonoscopy can prevent cancer and in many cases detected when it is at a very treatable phase. Small colon cancers can have cure rates of 95%. Advanced colon cancer, which often occurs in people who do not do their screenings, have cure rates less than 20%. The risk of colon cancer advances with age. Most adults should have regular colonoscopies every 10 years starting at age 52. This recommendation can vary depending on a person's medical history.  Health-care laws now  allow for you to call the gastroenterologist office directly in order to set yourself up for this very important tests. Today we have recommended to you that you do this test. This test may save your life. Failure to do this test puts you at risk for premature death from colon cancer. Do the right thing and schedule this test now.  Here as a list of specialists we recommend in the surrounding area. When you call their office let them know that you are a patient of our practice in your interested in doing a screening colonoscopy. They should assist you without problems. You will need the following information when you called them: 1-name of which Dr. you see, 2-your insurance information, 3-a list of medications that you currently take, 4-any allergies you have to medications.  [place] gastroenterologist Dr. Milton Ferguson, Dr Felicie Morn gastroenterologist   [facility]   [phone]  [facility] gastroenterology (Dr. Garnetta Buddy and [place]) [phone]  [facility] gastroenterology (Dr. Leia Alf, Harrietta Guardian, [place]) [phone]  Each group of specialists has assured [place] that when you called them they will help you get your colonoscopy set up. Should you have problems or if the GI practice insist a referral be done please let us know. Be sure to call soon. Sincerely, Pearson Forster, Dr Mickie Hillier, [place]

## 2020-01-28 ENCOUNTER — Other Ambulatory Visit: Payer: Self-pay | Admitting: Family Medicine

## 2020-01-30 NOTE — Telephone Encounter (Signed)
This +3 refills 

## 2020-02-14 ENCOUNTER — Other Ambulatory Visit: Payer: Self-pay | Admitting: Family Medicine

## 2020-03-24 LAB — HM DIABETES EYE EXAM

## 2020-05-20 ENCOUNTER — Other Ambulatory Visit: Payer: Self-pay | Admitting: Family Medicine

## 2020-05-30 ENCOUNTER — Telehealth: Payer: Self-pay | Admitting: Family Medicine

## 2020-05-30 NOTE — Telephone Encounter (Signed)
Pt made 3 month follow up needs blood work done before 09/17

## 2020-05-30 NOTE — Telephone Encounter (Signed)
Labs were ordered at last visit and are at Lab. Left message to return call to notify patient.

## 2020-06-17 LAB — LIPID PANEL
Chol/HDL Ratio: 2.7 ratio (ref 0.0–4.4)
Cholesterol, Total: 162 mg/dL (ref 100–199)
HDL: 60 mg/dL (ref >39)
LDL Chol Calc (NIH): 82 mg/dL (ref 0–99)
Triglycerides: 114 mg/dL (ref 0–149)
VLDL Cholesterol Cal: 20 mg/dL (ref 5–40)

## 2020-06-17 LAB — BASIC METABOLIC PANEL
BUN/Creatinine Ratio: 20 (ref 9–23)
BUN: 13 mg/dL (ref 6–24)
CO2: 26 mmol/L (ref 20–29)
Calcium: 9.6 mg/dL (ref 8.7–10.2)
Chloride: 99 mmol/L (ref 96–106)
Creatinine, Ser: 0.65 mg/dL (ref 0.57–1.00)
GFR calc Af Amer: 113 mL/min/{1.73_m2} (ref >59)
GFR calc non Af Amer: 98 mL/min/{1.73_m2} (ref >59)
Glucose: 188 mg/dL — ABNORMAL HIGH (ref 65–99)
Potassium: 4.2 mmol/L (ref 3.5–5.2)
Sodium: 138 mmol/L (ref 134–144)

## 2020-06-17 LAB — HEMOGLOBIN A1C
Est. average glucose Bld gHb Est-mCnc: 174 mg/dL
Hgb A1c MFr Bld: 7.7 % — ABNORMAL HIGH (ref 4.8–5.6)

## 2020-06-21 ENCOUNTER — Other Ambulatory Visit: Payer: Self-pay | Admitting: Family Medicine

## 2020-06-29 ENCOUNTER — Other Ambulatory Visit: Payer: Self-pay | Admitting: Family Medicine

## 2020-06-30 ENCOUNTER — Ambulatory Visit: Payer: 59 | Admitting: Family Medicine

## 2020-06-30 ENCOUNTER — Other Ambulatory Visit: Payer: Self-pay

## 2020-06-30 ENCOUNTER — Encounter: Payer: Self-pay | Admitting: Family Medicine

## 2020-06-30 VITALS — BP 122/70 | HR 98 | Temp 97.1°F | Wt 190.2 lb

## 2020-06-30 DIAGNOSIS — E785 Hyperlipidemia, unspecified: Secondary | ICD-10-CM

## 2020-06-30 DIAGNOSIS — E119 Type 2 diabetes mellitus without complications: Secondary | ICD-10-CM

## 2020-06-30 DIAGNOSIS — E1169 Type 2 diabetes mellitus with other specified complication: Secondary | ICD-10-CM

## 2020-06-30 DIAGNOSIS — F5104 Psychophysiologic insomnia: Secondary | ICD-10-CM | POA: Diagnosis not present

## 2020-06-30 DIAGNOSIS — Z23 Encounter for immunization: Secondary | ICD-10-CM | POA: Diagnosis not present

## 2020-06-30 MED ORDER — TRULICITY 1.5 MG/0.5ML ~~LOC~~ SOAJ: SUBCUTANEOUS | 5 refills | Status: DC

## 2020-06-30 MED ORDER — CITALOPRAM HYDROBROMIDE 20 MG PO TABS: ORAL_TABLET | ORAL | 1 refills | Status: DC

## 2020-06-30 MED ORDER — RIZATRIPTAN BENZOATE 10 MG PO TBDP: 10.0000 mg | ORAL_TABLET | ORAL | 2 refills | Status: DC | PRN

## 2020-06-30 MED ORDER — ESZOPICLONE 3 MG PO TABS
ORAL_TABLET | ORAL | 5 refills | Status: DC
Start: 2020-06-30 — End: 2020-07-14

## 2020-06-30 NOTE — Progress Notes (Signed)
   Subjective:    Patient ID: Molly Ryan, female    DOB: 10/31/1960, 59 y.o.   MRN: [id number]  Diabetes She presents for her follow-up diabetic visit. She has type 2 diabetes mellitus. There are no hypoglycemic associated symptoms. Pertinent negatives for hypoglycemia include no confusion or dizziness. There are no diabetic associated symptoms. Pertinent negatives for diabetes include no chest pain, no fatigue, no polydipsia, no polyphagia and no weakness. There are no hypoglycemic complications. There are no diabetic complications. She participates in exercise intermittently. Home blood sugar record trend: not checking sugars as much as she should. She does not see a podiatrist.Eye exam is current.   Results for orders placed or performed in visit on 03/24/20  HM DIABETES EYE EXAM  Result Value Ref Range   HM Diabetic Eye Exam No Retinopathy No Retinopathy      Review of Systems  Constitutional: Negative for activity change, appetite change and fatigue.  HENT: Negative for congestion and rhinorrhea.   Respiratory: Negative for cough and shortness of breath.   Cardiovascular: Negative for chest pain and leg swelling.  Gastrointestinal: Negative for abdominal pain and diarrhea.  Endocrine: Negative for polydipsia and polyphagia.  Skin: Negative for color change.  Neurological: Negative for dizziness and weakness.  Psychiatric/Behavioral: Negative for behavioral problems and confusion.       Objective:   Physical Exam Vitals reviewed.  Constitutional:      General: She is not in acute distress. HENT:     Head: Normocephalic and atraumatic.  Eyes:     General:        Right eye: No discharge.        Left eye: No discharge.  Neck:     Trachea: No tracheal deviation.  Cardiovascular:     Rate and Rhythm: Normal rate and regular rhythm.     Heart sounds: Normal heart sounds. No murmur heard.   Pulmonary:     Effort: Pulmonary effort is normal. No respiratory distress.      Breath sounds: Normal breath sounds.  Lymphadenopathy:     Cervical: No cervical adenopathy.  Skin:    General: Skin is warm and dry.  Neurological:     Mental Status: She is alert.     Coordination: Coordination normal.  Psychiatric:        Behavior: Behavior normal.     Patient defers on mammogram patient defers on colonoscopy      Assessment & Plan:  1. Type 2 diabetes mellitus without complication, without long-term current use of insulin ([place]) Type 2 diabetes subpar control work harder on diet adjust medication go up on Trulicity 1.5 mg weekly  2. Hyperlipidemia associated with type 2 diabetes mellitus (HCC) Cholesterol fair control continue statin  3. Chronic insomnia Continue Lunesta at nighttime  4. Need for vaccination Flu shot today - Flu Vaccine QUAD 6+ mos PF IM (Fluarix Quad PF) Work hard on diet portions weight control

## 2020-06-30 NOTE — Patient Instructions (Signed)

## 2020-07-13 ENCOUNTER — Other Ambulatory Visit: Payer: Self-pay

## 2020-07-13 ENCOUNTER — Telehealth (INDEPENDENT_AMBULATORY_CARE_PROVIDER_SITE_OTHER): Payer: 59 | Admitting: Family Medicine

## 2020-07-13 ENCOUNTER — Other Ambulatory Visit: Payer: Self-pay | Admitting: Family Medicine

## 2020-07-13 DIAGNOSIS — M5432 Sciatica, left side: Secondary | ICD-10-CM

## 2020-07-13 DIAGNOSIS — R5383 Other fatigue: Secondary | ICD-10-CM

## 2020-07-13 MED ORDER — HYDROCODONE-ACETAMINOPHEN 5-325 MG PO TABS
1.0000 | ORAL_TABLET | ORAL | 0 refills | Status: DC | PRN
Start: 2020-07-13 — End: 2021-02-23

## 2020-07-13 MED ORDER — HYDROCODONE-ACETAMINOPHEN 5-325 MG PO TABS: 1.0000 | ORAL_TABLET | ORAL | 0 refills | Status: DC | PRN

## 2020-07-13 MED ORDER — ETODOLAC 400 MG PO TABS: 400.0000 mg | ORAL_TABLET | Freq: Two times a day (BID) | ORAL | 1 refills | Status: DC

## 2020-07-13 NOTE — Progress Notes (Signed)
   Subjective:    Patient ID: Molly Ryan, female    DOB: May 01, 1961, 59 y.o.   MRN: [id number]  Back Pain This is a new problem. Episode onset: one week. The pain is present in the lumbar spine (left side). The pain radiates to the left thigh and left foot (left foot numbness). Treatments tried: tylenol 1,000.   she has been exposed to covid this morning. And from a patient last week. Not having any symptoms. Has had vaccine and was wearing mask.  She does relate the back pain goes down her leg.  She states it hurts with certain movements.  She denies any weakness in the foot.  She works in a [employer].  She has had the vaccine. Virtual Visit via Telephone Note  I connected with Molly Ryan on 07/13/20 at  4:10 PM EDT by telephone and verified that I am speaking with the correct person using two identifiers.  Location: Patient: home Provider:  office   I discussed the limitations, risks, security and privacy concerns of performing an evaluation and management service by telephone and the availability of in person appointments. I also discussed with the patient that there may be a patient responsible charge related to this service. The patient expressed understanding and agreed to proceed.   History of Present Illness:    Observations/Objective:   Assessment and Plan:   Follow Up Instructions:    I discussed the assessment and treatment plan with the patient. The patient was provided an opportunity to ask questions and all were answered. The patient agreed with the plan and demonstrated an understanding of the instructions.   The patient was advised to call back or seek an in-person evaluation if the symptoms worsen or if the condition fails to improve as anticipated.  I provided 20 including documentation chart review and time spent with patient minutes of non-face-to-face time during this encounter.       Review of Systems  Musculoskeletal: Positive  for back pain.  Back pain radiates down the leg numbness tingling denies weakness Does relate fatigue tiredness but denies sore throat runny nose cough     Objective:   Physical Exam Today's visit was via telephone Physical exam was not possible for this visit e patient states she is able to move her foot up and down without difficulty relates back pain and leg pain.       Assessment & Plan:  Sciatica left Warning signs regarding weakness discussed Try anti-inflammatory pain medicine when necessary do not take pain medicine with sleep medicine follow-up if progressive troubles or worse recommend follow-up sooner if any issues in person visit next week if not doing better  Covid exposure fatigue today I encourage patient to get tested also encourage patient to watch closely if she starts developing body aches runny nose cough sore throat she needs to stay home from work

## 2020-10-01 ENCOUNTER — Other Ambulatory Visit: Payer: Self-pay | Admitting: Family Medicine

## 2020-10-31 ENCOUNTER — Other Ambulatory Visit: Payer: Self-pay | Admitting: Family Medicine

## 2020-11-07 ENCOUNTER — Other Ambulatory Visit: Payer: Self-pay | Admitting: *Deleted

## 2020-11-07 MED ORDER — CITALOPRAM HYDROBROMIDE 20 MG PO TABS: ORAL_TABLET | ORAL | 0 refills | Status: DC

## 2020-11-28 ENCOUNTER — Telehealth: Payer: Self-pay | Admitting: Family Medicine

## 2020-11-28 NOTE — Telephone Encounter (Signed)
PA needed for Trulicity. PA attempted but came up saying that this drug was covered by current plan. Left message for patient to return call to let her know.

## 2020-11-28 NOTE — Telephone Encounter (Signed)
Patient states she just picked her med without any problems and has never had any problems getting this med

## 2020-11-30 ENCOUNTER — Other Ambulatory Visit: Payer: Self-pay

## 2020-11-30 MED ORDER — METFORMIN HCL 500 MG PO TABS: ORAL_TABLET | ORAL | 1 refills | Status: DC

## 2020-12-22 ENCOUNTER — Telehealth: Payer: 59 | Admitting: Physician Assistant

## 2020-12-22 ENCOUNTER — Other Ambulatory Visit: Payer: Self-pay | Admitting: Family Medicine

## 2020-12-22 DIAGNOSIS — J019 Acute sinusitis, unspecified: Secondary | ICD-10-CM

## 2020-12-22 MED ORDER — DOXYCYCLINE HYCLATE 100 MG PO CAPS: 100.0000 mg | ORAL_CAPSULE | Freq: Two times a day (BID) | ORAL | 0 refills | Status: AC

## 2020-12-22 NOTE — Progress Notes (Signed)

## 2020-12-25 ENCOUNTER — Encounter: Payer: Self-pay | Admitting: Family Medicine

## 2020-12-25 ENCOUNTER — Ambulatory Visit (INDEPENDENT_AMBULATORY_CARE_PROVIDER_SITE_OTHER): Payer: 59 | Admitting: Family Medicine

## 2020-12-25 ENCOUNTER — Other Ambulatory Visit: Payer: Self-pay

## 2020-12-25 VITALS — HR 86 | Temp 98.6°F | Ht 60.0 in | Wt 190.0 lb

## 2020-12-25 DIAGNOSIS — R110 Nausea: Secondary | ICD-10-CM | POA: Diagnosis not present

## 2020-12-25 DIAGNOSIS — J019 Acute sinusitis, unspecified: Secondary | ICD-10-CM | POA: Diagnosis not present

## 2020-12-25 DIAGNOSIS — H9201 Otalgia, right ear: Secondary | ICD-10-CM | POA: Diagnosis not present

## 2020-12-25 MED ORDER — ONDANSETRON 4 MG PO TBDP: 4.0000 mg | ORAL_TABLET | Freq: Three times a day (TID) | ORAL | 0 refills | Status: DC | PRN

## 2020-12-25 MED ORDER — MECLIZINE HCL 25 MG PO TABS: ORAL_TABLET | ORAL | 1 refills | Status: DC

## 2020-12-25 NOTE — Progress Notes (Signed)
Patient ID: Molly Ryan, female    DOB: 03/12/1961, 60 y.o.   MRN: [id number]   No chief complaint on file.  Subjective:  CC: right ear pain  This is a new problem.  Presents today for an acute visit with a complaint of right-sided ear pain, face pain, teeth pain and history of dizziness which is not new.  Had an ED visit on Friday, prescribed doxycycline, reports that that is not helping.  Wishes to have a refill on meclizine for dizziness.  Denies fever, chills, chest pain, shortness of breath.  Endorses right-sided ear pain, and right sinus pain and pressure.   Patient presents today with respiratory illness Number of days present- one week. Had E-visit and prescribed doxy.   Symptoms include- head hurts on right side, teeth hurt, ear pain on right side, dizziness - asking for refill on meclizine.   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   Outpatient Encounter Medications as of 12/25/2020  Medication Sig  . citalopram (CELEXA) 20 MG tablet TAKE 1 TABLET(20 MG) BY MOUTH DAILY  . doxycycline (VIBRAMYCIN) 100 MG capsule Take 1 capsule (100 mg total) by mouth 2 (two) times daily for 7 days.  . Dulaglutide (TRULICITY) 1.5 LH/7.3SK SOPN ADMINISTER 1.5 MG UNDER THE SKIN 1 TIME A WEEK  . Eszopiclone 3 MG TABS TAKE 1 TABLET(3 MG) BY MOUTH AT BEDTIME  . metFORMIN (GLUCOPHAGE) 500 MG tablet TAKE 2 TABLETS BY MOUTH TWICE DAILY  . ondansetron (ZOFRAN ODT) 4 MG disintegrating tablet Take 1 tablet (4 mg total) by mouth every 8 (eight) hours as needed for nausea or vomiting.  . pravastatin (PRAVACHOL) 40 MG tablet TAKE 1 TABLET BY MOUTH  EVERY MONDAY, WEDNESDAY, AND FRIDAY  . rizatriptan (MAXALT-MLT) 10 MG disintegrating tablet Take 1 tablet (10 mg total) by mouth as needed for migraine. May repeat in 2 hours if needed  . [DISCONTINUED] etodolac (LODINE) 400 MG tablet Take 1 tablet (400 mg total) by mouth 2 (two) times daily.  . [DISCONTINUED] meclizine (ANTIVERT) 25 MG tablet Take one tablet every 6 hours prn dizziness. Caution drowiness  . meclizine (ANTIVERT) 25 MG tablet Take one tablet every 6 hours prn dizziness. Caution drowiness  . [DISCONTINUED] ondansetron (ZOFRAN) 8 MG tablet Take 1 tablet (8 mg total) by mouth every 8 (eight) hours as needed for nausea.   No facility-administered encounter medications on file as of 12/25/2020.     Review of Systems  Constitutional: Negative for chills and fever.  HENT: Positive for ear pain, sinus pressure, sinus pain, sneezing and tinnitus. Negative for sore throat and trouble swallowing.   Respiratory: Negative for cough and shortness of breath.   Cardiovascular: Negative for chest pain.  Gastrointestinal: Negative for abdominal pain.  Neurological: Positive for dizziness and headaches.       History of dizziness/vertigo.      Vitals Pulse 86   Temp 98.6 F (37 C)   Ht 5' (1.524 m)   Wt 190 lb (86.2 kg)   SpO2 99%   BMI 37.11 kg/m   Objective:   Physical Exam Vitals reviewed.  Constitutional:  Appearance: Normal appearance.  HENT:     Right Ear: Tympanic membrane normal.     Left Ear: Tympanic membrane normal.     Nose:     Right Sinus: Maxillary sinus tenderness and frontal sinus tenderness present.     Left Sinus: No maxillary sinus tenderness or frontal sinus tenderness.     Mouth/Throat:     Mouth: Mucous membranes are moist.     Pharynx: Oropharynx is clear.  Cardiovascular:     Rate and Rhythm: Normal rate and regular rhythm.     Heart sounds: Normal heart sounds.  Pulmonary:     Effort: Pulmonary effort is normal.     Breath sounds: Normal  breath sounds.  Skin:    General: Skin is warm and dry.  Neurological:     General: No focal deficit present.     Mental Status: She is alert.  Psychiatric:        Behavior: Behavior normal.      Assessment and Plan   1. Right ear pain  2. Nausea - ondansetron (ZOFRAN ODT) 4 MG disintegrating tablet; Take 1 tablet (4 mg total) by mouth every 8 (eight) hours as needed for nausea or vomiting.  Dispense: 20 tablet; Refill: 0  3. Acute sinusitis, recurrence not specified, unspecified location   Had e-visit on Friday, received Doxycycline for acute bacterial sinusitis-- will continue Doxy and complete course before treatment failure is declared. Will give Zofran for nausea and refill meclizine for history of dizziness.   Agrees with plan of care discussed today. Understands warning signs to seek further care: chest pain, shortness of breath, any significant change in health.  Understands to follow-up if symptoms do not improve and will consider adding different antibiotic. Encouraged to flush sinuses,  information given.     Pecolia Ades, NP 12/25/2020

## 2020-12-25 NOTE — Addendum Note (Signed)
Addended by: Carmelina Noun on: 12/25/2020 04:01 PM   Modules accepted: Orders

## 2020-12-25 NOTE — Patient Instructions (Signed)
How to Perform a Sinus Rinse A sinus rinse is a home treatment. It rinses your sinuses with a mixture of salt and water (saline solution). Sinuses are air-filled spaces in your skull behind the bones of your face and forehead. They open into your nasal cavity. A sinus rinse can help to clear your nasal cavity. It can clear mucus, dirt, dust, or pollen. You may do a sinus rinse when you have:  A cold.  A virus.  Allergies.  A sinus infection.  A stuffy nose. Talk with your doctor about whether a sinus rinse might help you. What are the risks? A sinus rinse is normally very safe and helpful. However, there are a few risks. These include:  A burning feeling in the sinuses. This may happen if you do not make the saline solution as instructed. Be sure to follow all directions when making the saline solution.  Nasal irritation.  Infection from unclean water. This is rare, but possible. Do not do a sinus rinse if you have had:  Ear or nasal surgery.  An ear infection.  Blocked ears. Supplies needed:  Saline solution or powder.  Distilled or germ-free (sterile) water may be needed to mix with saline powder. ? You may use boiled and cooled tap water. Boil tap water for 5 minutes; cool until it is lukewarm. Use within 24 hours. ? Do not use regular tap water to mix with the saline solution.  Neti pot or nasal rinse bottle. This releases the saline solution into your nose and through your sinuses. You can buy neti pots and rinse bottles: ? At your local pharmacy. ? At a health food store. ? Online. How to perform a sinus rinse 1. Wash your hands with soap and water. 2. Wash your device using the directions that came with it. 3. Dry your device. 4. Use the solution that comes with your device or one that is sold separately in stores. Follow the mixing directions on the package if you need to mix with sterile or distilled water. 5. Fill your device with the amount of saline solution  stated in the device instructions. 6. Stand over a sink and tilt your head sideways over the sink. 7. Place the spout of the device in your upper nostril (the one closer to the ceiling). 8. Gently pour or squeeze the saline solution into your nasal cavity. The liquid should drain to your lower nostril if you are not too stuffed up (congested). 9. While rinsing, breathe through your open mouth. 10. Gently blow your nose to clear any mucus and rinse solution. Blowing too hard may cause ear pain. 11. Repeat in your other nostril. 12. Clean and rinse your device with clean water. 13. Air-dry your device. Talk with your doctor or pharmacist if you have questions about how to do a sinus rinse.   Summary  A sinus rinse is a home treatment. It rinses your sinuses with a mixture of salt and water (saline solution).  A sinus rinse is normally very safe and helpful. Follow all instructions carefully.  Talk with your doctor about whether a sinus rinse might help you. This information is not intended to replace advice given to you by your health care provider. Make sure you discuss any questions you have with your health care provider. Document Revised: 07/11/2020 Document Reviewed: 07/11/2020 Elsevier Patient Education  2021 [employer]vier Inc.  

## 2020-12-26 LAB — SARS-COV-2, NAA 2 DAY TAT

## 2020-12-26 LAB — NOVEL CORONAVIRUS, NAA: SARS-CoV-2, NAA: NOT DETECTED

## 2020-12-26 LAB — SPECIMEN STATUS REPORT

## 2020-12-27 ENCOUNTER — Telehealth: Payer: Self-pay

## 2020-12-27 DIAGNOSIS — E785 Hyperlipidemia, unspecified: Secondary | ICD-10-CM

## 2020-12-27 DIAGNOSIS — E119 Type 2 diabetes mellitus without complications: Secondary | ICD-10-CM

## 2020-12-27 DIAGNOSIS — Z79899 Other long term (current) drug therapy: Secondary | ICD-10-CM

## 2020-12-27 DIAGNOSIS — E1169 Type 2 diabetes mellitus with other specified complication: Secondary | ICD-10-CM

## 2020-12-27 NOTE — Telephone Encounter (Signed)
Lipid, CMP, A1c, urine ACR

## 2020-12-27 NOTE — Telephone Encounter (Signed)
Last labs 06/16/20: Lipid, Met 7 and HgbA1c

## 2020-12-27 NOTE — Telephone Encounter (Signed)
Patient made appt for April 22nd for f/u on medication.  Would like lab orders put in for [facility]

## 2020-12-27 NOTE — Telephone Encounter (Signed)
Blood work ordered in Epic. Patient notified. 

## 2021-01-12 ENCOUNTER — Other Ambulatory Visit: Payer: Self-pay

## 2021-01-12 MED ORDER — METFORMIN HCL 500 MG PO TABS: ORAL_TABLET | ORAL | 1 refills | Status: DC

## 2021-01-15 ENCOUNTER — Other Ambulatory Visit: Payer: Self-pay

## 2021-01-15 ENCOUNTER — Other Ambulatory Visit: Payer: Self-pay | Admitting: Family Medicine

## 2021-01-15 ENCOUNTER — Telehealth: Payer: Self-pay | Admitting: Family Medicine

## 2021-01-15 MED ORDER — PRAVASTATIN SODIUM 40 MG PO TABS: ORAL_TABLET | ORAL | 1 refills | Status: DC

## 2021-01-15 MED ORDER — ESZOPICLONE 3 MG PO TABS: ORAL_TABLET | ORAL | 1 refills | Status: DC

## 2021-01-15 NOTE — Telephone Encounter (Signed)
Pt.notified

## 2021-01-15 NOTE — Telephone Encounter (Signed)
[facility] [address] requesting refill on Eszopiclone 3 mg tablets. Pt has upcoming appt on 02/02/21. Please advise. Thank you

## 2021-01-15 NOTE — Telephone Encounter (Signed)
Prescription sent in keep follow-up visit

## 2021-01-23 ENCOUNTER — Other Ambulatory Visit: Payer: Self-pay | Admitting: Family Medicine

## 2021-01-27 LAB — COMPREHENSIVE METABOLIC PANEL
ALT: 26 IU/L (ref 0–32)
AST: 17 IU/L (ref 0–40)
Albumin/Globulin Ratio: 2.1 (ref 1.2–2.2)
Albumin: 4.6 g/dL (ref 3.8–4.9)
Alkaline Phosphatase: 69 IU/L (ref 44–121)
BUN/Creatinine Ratio: 14 (ref 9–23)
BUN: 11 mg/dL (ref 6–24)
Bilirubin Total: 0.4 mg/dL (ref 0.0–1.2)
CO2: 23 mmol/L (ref 20–29)
Calcium: 9.4 mg/dL (ref 8.7–10.2)
Chloride: 96 mmol/L (ref 96–106)
Creatinine, Ser: 0.76 mg/dL (ref 0.57–1.00)
Globulin, Total: 2.2 g/dL (ref 1.5–4.5)
Glucose: 193 mg/dL — ABNORMAL HIGH (ref 65–99)
Potassium: 4.1 mmol/L (ref 3.5–5.2)
Sodium: 142 mmol/L (ref 134–144)
Total Protein: 6.8 g/dL (ref 6.0–8.5)
eGFR: 90 mL/min/{1.73_m2} (ref >59)

## 2021-01-27 LAB — LIPID PANEL
Chol/HDL Ratio: 2.9 ratio (ref 0.0–4.4)
Cholesterol, Total: 167 mg/dL (ref 100–199)
HDL: 57 mg/dL (ref >39)
LDL Chol Calc (NIH): 85 mg/dL (ref 0–99)
Triglycerides: 142 mg/dL (ref 0–149)
VLDL Cholesterol Cal: 25 mg/dL (ref 5–40)

## 2021-01-27 LAB — HEMOGLOBIN A1C
Est. average glucose Bld gHb Est-mCnc: 186 mg/dL
Hgb A1c MFr Bld: 8.1 % — ABNORMAL HIGH (ref 4.8–5.6)

## 2021-01-27 LAB — MICROALBUMIN / CREATININE URINE RATIO
Creatinine, Urine: 125.9 mg/dL
Microalb/Creat Ratio: 6 mg/g creat (ref 0–29)
Microalbumin, Urine: 7.5 ug/mL

## 2021-02-02 ENCOUNTER — Ambulatory Visit (INDEPENDENT_AMBULATORY_CARE_PROVIDER_SITE_OTHER): Payer: 59 | Admitting: Family Medicine

## 2021-02-02 ENCOUNTER — Other Ambulatory Visit: Payer: Self-pay

## 2021-02-02 ENCOUNTER — Encounter: Payer: Self-pay | Admitting: Family Medicine

## 2021-02-02 VITALS — BP 150/91 | HR 65 | Temp 97.2°F | Ht 60.0 in | Wt 188.0 lb

## 2021-02-02 DIAGNOSIS — E1169 Type 2 diabetes mellitus with other specified complication: Secondary | ICD-10-CM

## 2021-02-02 DIAGNOSIS — E119 Type 2 diabetes mellitus without complications: Secondary | ICD-10-CM

## 2021-02-02 DIAGNOSIS — F5104 Psychophysiologic insomnia: Secondary | ICD-10-CM

## 2021-02-02 DIAGNOSIS — E785 Hyperlipidemia, unspecified: Secondary | ICD-10-CM | POA: Diagnosis not present

## 2021-02-02 MED ORDER — CITALOPRAM HYDROBROMIDE 20 MG PO TABS: ORAL_TABLET | ORAL | 1 refills | Status: DC

## 2021-02-02 MED ORDER — ESZOPICLONE 3 MG PO TABS: ORAL_TABLET | ORAL | 5 refills | Status: DC

## 2021-02-02 MED ORDER — TRULICITY 3 MG/0.5ML ~~LOC~~ SOAJ: 3.0000 mg | SUBCUTANEOUS | 5 refills | Status: DC

## 2021-02-02 MED ORDER — DICLOFENAC SODIUM 50 MG PO TBEC: 50.0000 mg | DELAYED_RELEASE_TABLET | Freq: Two times a day (BID) | ORAL | 1 refills | Status: DC

## 2021-02-02 NOTE — Progress Notes (Signed)
Subjective:    Patient ID: Molly Ryan, female    DOB: 1961-04-30, 60 y.o.   MRN: [id number]  Diabetes She presents for her follow-up diabetic visit. She has type 2 diabetes mellitus. Pertinent negatives for hypoglycemia include no dizziness or headaches. Pertinent negatives for diabetes include no chest pain and no fatigue. Risk factors for coronary artery disease include diabetes mellitus and hypertension. Current diabetic treatments: trulicity , metformin.  She does take her citalopram regular basis states her moods are overall doing well Has insomnia takes her medicine at night this is working well for Takes her diabetes medicine regular basis no trouble Her pravastatin she is taking that watching her diet  C/O leg cramps with sitting   Results for orders placed or performed in visit on 12/27/20  Lipid panel  Result Value Ref Range   Cholesterol, Total 167 100 - 199 mg/dL   Triglycerides 142 0 - 149 mg/dL   HDL 57 >39 mg/dL   VLDL Cholesterol Cal 25 5 - 40 mg/dL   LDL Chol Calc (NIH) 85 0 - 99 mg/dL   Chol/HDL Ratio 2.9 0.0 - 4.4 ratio  Comprehensive metabolic panel  Result Value Ref Range   Glucose 193 (H) 65 - 99 mg/dL   BUN 11 6 - 24 mg/dL   Creatinine, Ser 0.76 0.57 - 1.00 mg/dL   eGFR 90 >59 mL/min/1.73   BUN/Creatinine Ratio 14 9 - 23   Sodium 142 134 - 144 mmol/L   Potassium 4.1 3.5 - 5.2 mmol/L   Chloride 96 96 - 106 mmol/L   CO2 23 20 - 29 mmol/L   Calcium 9.4 8.7 - 10.2 mg/dL   Total Protein 6.8 6.0 - 8.5 g/dL   Albumin 4.6 3.8 - 4.9 g/dL   Globulin, Total 2.2 1.5 - 4.5 g/dL   Albumin/Globulin Ratio 2.1 1.2 - 2.2   Bilirubin Total 0.4 0.0 - 1.2 mg/dL   Alkaline Phosphatase 69 44 - 121 IU/L   AST 17 0 - 40 IU/L   ALT 26 0 - 32 IU/L  Hemoglobin A1c  Result Value Ref Range   Hgb A1c MFr Bld 8.1 (H) 4.8 - 5.6 %   Est. average glucose Bld gHb Est-mCnc 186 mg/dL  Microalbumin / creatinine urine ratio  Result Value Ref Range   Creatinine, Urine 125.9 Not  Estab. mg/dL   Microalbumin, Urine 7.5 Not Estab. ug/mL   Microalb/Creat Ratio 6 0 - 29 mg/g creat    Review of Systems  Constitutional: Negative for activity change, fatigue and fever.  HENT: Negative for congestion and rhinorrhea.   Respiratory: Negative for cough, chest tightness and shortness of breath.   Cardiovascular: Negative for chest pain and leg swelling.  Gastrointestinal: Negative for abdominal pain and nausea.  Skin: Negative for color change.  Neurological: Negative for dizziness and headaches.  Psychiatric/Behavioral: Negative for agitation and behavioral problems.       Objective:   Physical Exam Vitals reviewed.  Constitutional:      General: She is not in acute distress. HENT:     Head: Normocephalic and atraumatic.  Eyes:     General:        Right eye: No discharge.        Left eye: No discharge.  Neck:     Trachea: No tracheal deviation.  Cardiovascular:     Rate and Rhythm: Normal rate and regular rhythm.     Heart sounds: Normal heart sounds. No murmur heard.   Pulmonary:  Effort: Pulmonary effort is normal. No respiratory distress.     Breath sounds: Normal breath sounds.  Lymphadenopathy:     Cervical: No cervical adenopathy.  Skin:    General: Skin is warm and dry.  Neurological:     Mental Status: She is alert.     Coordination: Coordination normal.  Psychiatric:        Behavior: Behavior normal.    Stretching exercises were shown       Assessment & Plan:  Subpar control with diabetes 1. Chronic insomnia Takes her medicine regular basis knows not to drive with the medicine  2. Type 2 diabetes mellitus without complication, without long-term current use of insulin (HCC) Bump up the medication use 3 mg of Trulicity if any side effects or problems notify us otherwise recheck A1c again in 3 months follow-up here in the office for that A1c check - Dulaglutide (TRULICITY) 3 ZX/8.0WB SOPN; Inject 3 mg as directed once a week.   Dispense: 2 mL; Refill: 5  3. Hyperlipidemia associated with type 2 diabetes mellitus ([place]) Continue statin watch diet closely  Sciatica right leg we talked at length about the importance of getting this under control through stretches and anti-inflammatories If not having dramatic improvement over the course of the next few weeks may need MRI or physical therapy follow-up in 3 months

## 2021-02-16 ENCOUNTER — Encounter: Payer: Self-pay | Admitting: Family Medicine

## 2021-02-16 NOTE — Telephone Encounter (Signed)
See 4/22 for sciatica. Pt states every time she takes diclofenac she gets reflux. Pt states pain is about the same just has gone down into foot now.

## 2021-02-19 ENCOUNTER — Telehealth: Payer: Self-pay

## 2021-02-19 NOTE — Telephone Encounter (Signed)
Nurses  I would recommend stopping diclofenac. May use OTC Aleve-generic naproxen.  May take 2 tablets twice daily as needed for the back discomfort as well as a sciatica. If Crytal starts developing weakness in the foot this would be a urgent situation that would require MRI. Typically physical therapy is recommended to try to help with this issue-if she is interested in starting physical therapy and let us know we can make the referral.  I would recommend a follow-up office visit within 8 weeks and if ongoing troubles at that time consideration for MRI  As for reflux related issues if she is having esophageal burning consider OTC omeprazole 20 mg 1 daily  She will need to keep [place] posted with any changes and recommend follow-up office visit within 8 weeks sooner if having increasing pain or problems thank you-Dr. Nicki Reaper

## 2021-02-23 ENCOUNTER — Other Ambulatory Visit: Payer: Self-pay

## 2021-02-23 ENCOUNTER — Ambulatory Visit: Payer: 59 | Admitting: Family Medicine

## 2021-02-23 VITALS — BP 133/83 | Temp 97.7°F | Ht 60.0 in | Wt 186.0 lb

## 2021-02-23 DIAGNOSIS — M5432 Sciatica, left side: Secondary | ICD-10-CM | POA: Diagnosis not present

## 2021-02-23 DIAGNOSIS — R29898 Other symptoms and signs involving the musculoskeletal system: Secondary | ICD-10-CM

## 2021-02-23 MED ORDER — PRAVASTATIN SODIUM 40 MG PO TABS: ORAL_TABLET | ORAL | 3 refills | Status: DC

## 2021-02-23 MED ORDER — HYDROCODONE-ACETAMINOPHEN 5-325 MG PO TABS
1.0000 | ORAL_TABLET | ORAL | 0 refills | Status: AC | PRN
Start: 2021-02-23 — End: 2021-02-28

## 2021-02-23 NOTE — Progress Notes (Signed)
   Subjective:    Patient ID: Molly Ryan, female    DOB: 09/29/1961, 60 y.o.   MRN: [id number]  HPI Severe left leg sciatica.  Throbbing aching sharp pain goes down into her foot numbness in the foot now having weakness in the leg finds it is very difficult for her to do any type of walking movement.  Twisting lifting makes it worse.  Very difficult for her to get through the day.  She had problems with this back in the fall last year intermittently throughout the winter and then was seen again this spring now her symptoms are doing worse denies any loss of bowel or bladder Left leg sciatica sharp pain   Review of Systems See above    Objective:   Physical Exam Lungs clear heart regular low back subjective discomfort positive straight leg raise on the left weakness in the thigh muscle as well as hip flexors.  Patient can walk on heels and toes with some difficulty reflexes are good       Assessment & Plan:  Severe sciatica off and on for 6 months worse over the past month despite conservative measures and stretching home exercises and anti-inflammatories Patient now having muscle weakness therefore need to move forward with a urgent MRI may need neurosurgical consultation

## 2021-03-01 ENCOUNTER — Telehealth: Payer: Self-pay | Admitting: Family Medicine

## 2021-03-01 NOTE — Telephone Encounter (Signed)
Per Loma Sousa There was an Urgent MRI placed for Molly Ryan and per her insurance it was requesting the MD to call and do a peer to peer since it was Urgent and I sent a message to Dr. Wolfgang Phoenix and he has not responded  Please advise. Thank you

## 2021-03-01 NOTE — Telephone Encounter (Signed)
I did speak with [place] healthcare doctor approval was given for MRI without contrast lumbar spine I did send Loma Sousa a message but please go ahead and do so as well Approval number as follows [id number] Thanks-Dr. Nicki Reaper

## 2021-03-01 NOTE — Telephone Encounter (Signed)
Message sent to Courtney

## 2021-03-06 ENCOUNTER — Telehealth: Payer: Self-pay | Admitting: Family Medicine

## 2021-03-15 ENCOUNTER — Ambulatory Visit (HOSPITAL_COMMUNITY)
Admission: RE | Admit: 2021-03-15 | Discharge: 2021-03-15 | Disposition: A | Discharge status: Home / Self Care | Payer: 59 | Source: Ambulatory Visit | Attending: Family Medicine | Admitting: Family Medicine

## 2021-03-15 DIAGNOSIS — M5432 Sciatica, left side: Secondary | ICD-10-CM | POA: Diagnosis not present

## 2021-03-15 IMAGING — MR MR LUMBAR SPINE W/O CM
5 series · 31 of 48 positions shown · non-contrast
Comparison: CT Abdomen and Pelvis [DATE].

CLINICAL DATA: 60-year-old female with low back pain radiating to
the left buttock, leg, and toes for 4 weeks. No known injury.

EXAM:
MRI LUMBAR SPINE WITHOUT CONTRAST
TECHNIQUE: Multiplanar, multisequence MR imaging of the lumbar spine was
performed. No intravenous contrast was administered.

[Series 5: T2 · sagittal · 4.0mm · 0.68mm/px · 6 of 15 slices shown (1 of 2)]
[im 1/15]
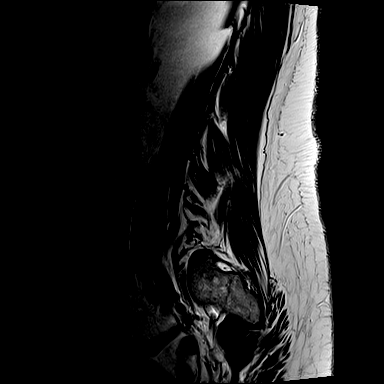
[im 3/15]
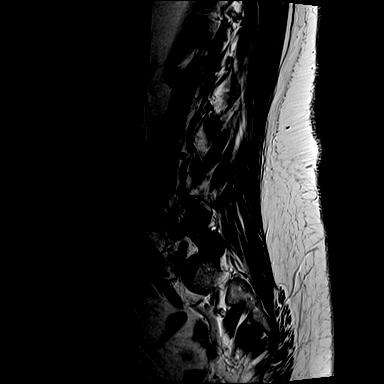
[im 6/15]
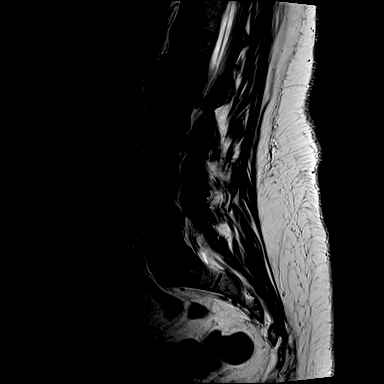
[im 9/15]
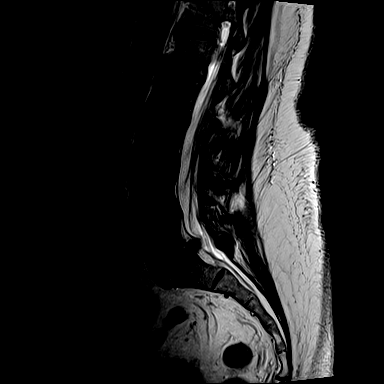
[im 12/15]
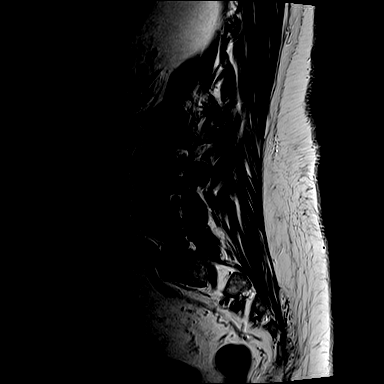
[im 15/15]
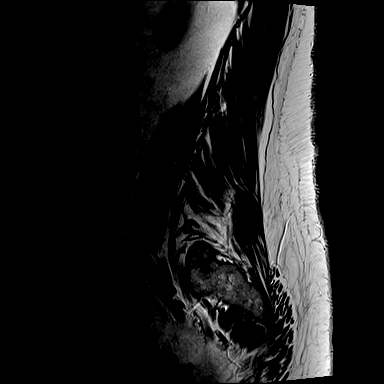

[Series 6: T1 · sagittal · 4.0mm · 0.81mm/px · 7 of 15 slices shown (1 of 2)]
[im 1/15]
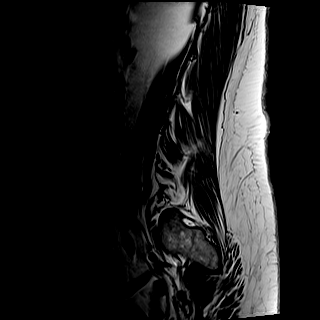
[im 3/15]
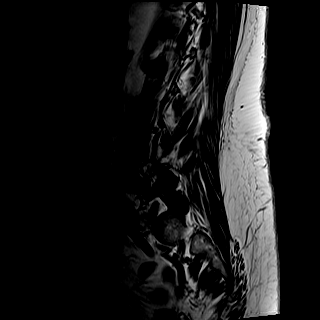
[im 5/15]
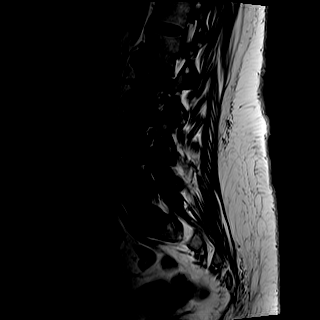
[im 8/15]
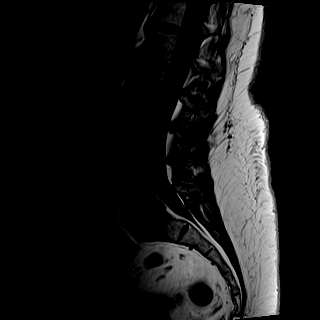
[im 10/15]
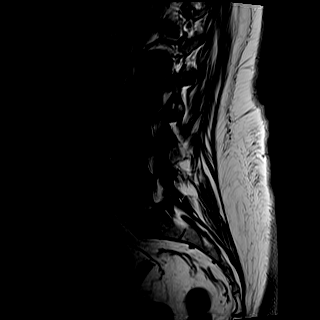
[im 12/15]
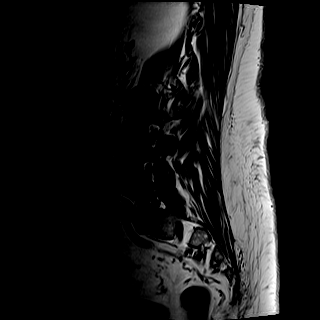
[im 15/15]
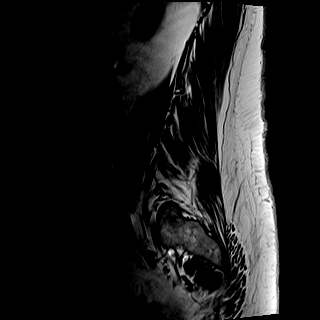

[Series 7: STIR · sagittal · 4.0mm · 0.51mm/px · 2 of 15 slices shown]
[im 1/15]
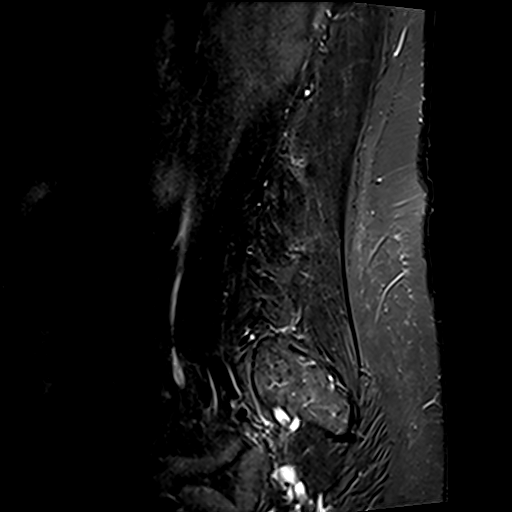
[im 3/15]
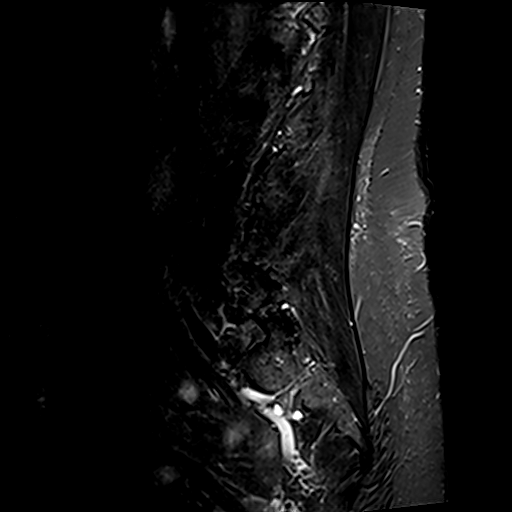

[Series 8: T2 · axial · 4.0mm · 0.70mm/px · z∈[-75,+92]mm · 8 of 29 slices shown (2 of 2)]
[im 1/29]
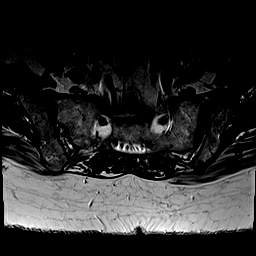
[im 5/29]
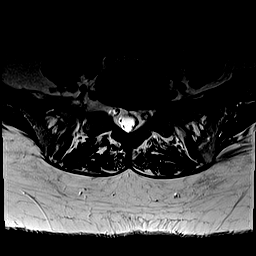
[im 9/29]
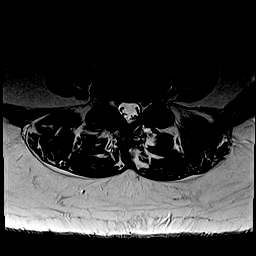
[im 13/29]
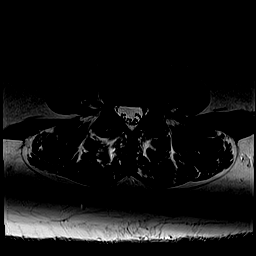
[im 16/29]
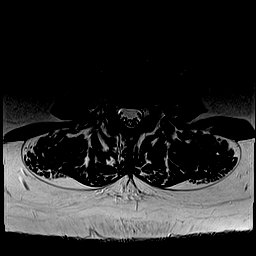
[im 20/29]
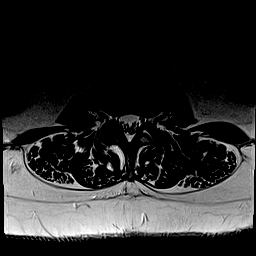
[im 24/29]
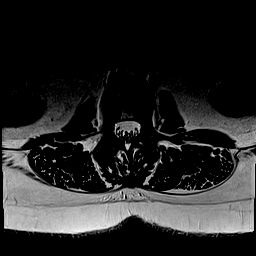
[im 29/29]
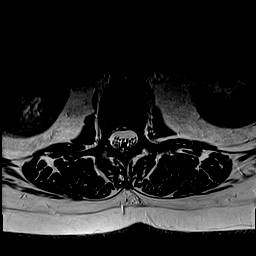

[Series 9: T1 · axial · 4.0mm · 0.35mm/px · z∈[-75,+92]mm · 8 of 29 slices shown (2 of 2)]
[im 1/29]
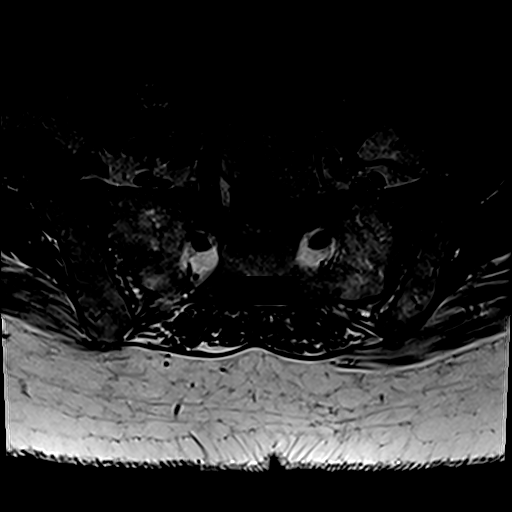
[im 5/29]
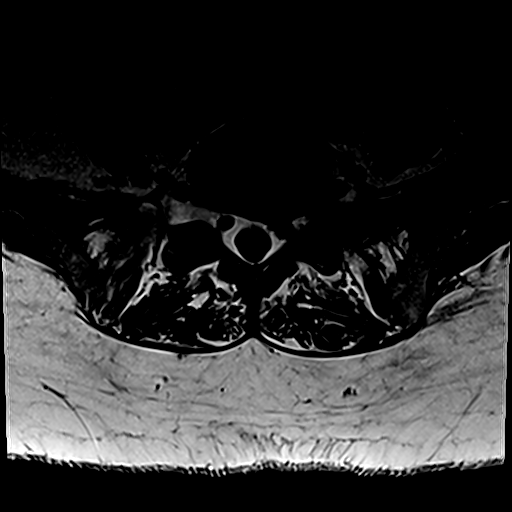
[im 9/29]
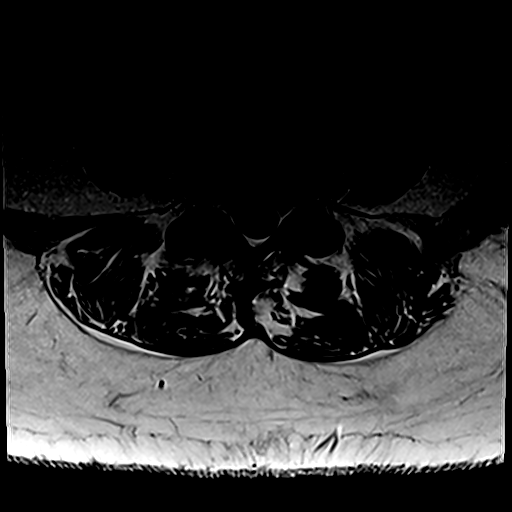
[im 13/29]
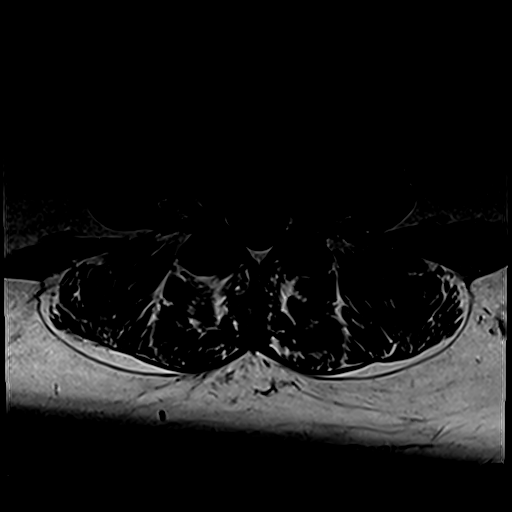
[im 16/29]
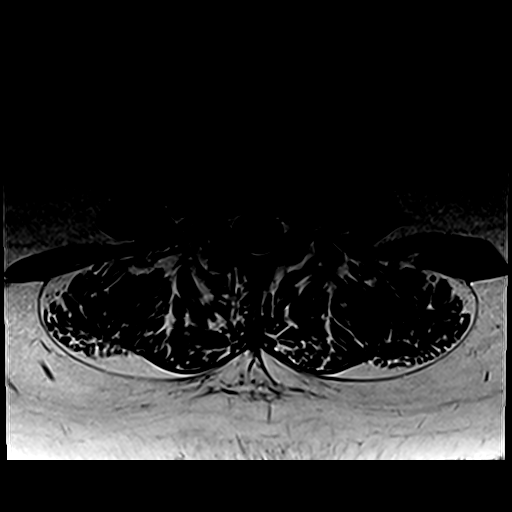
[im 20/29]
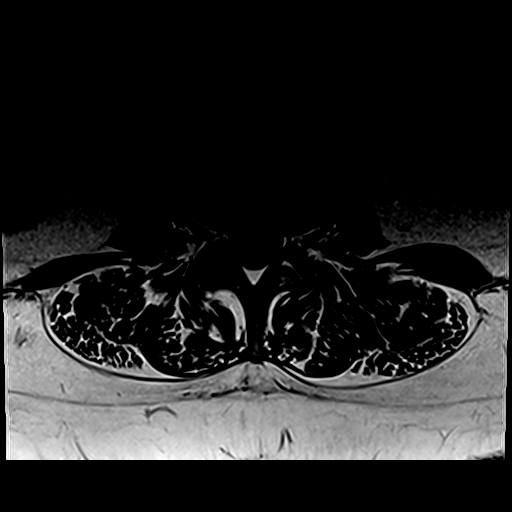
[im 24/29]
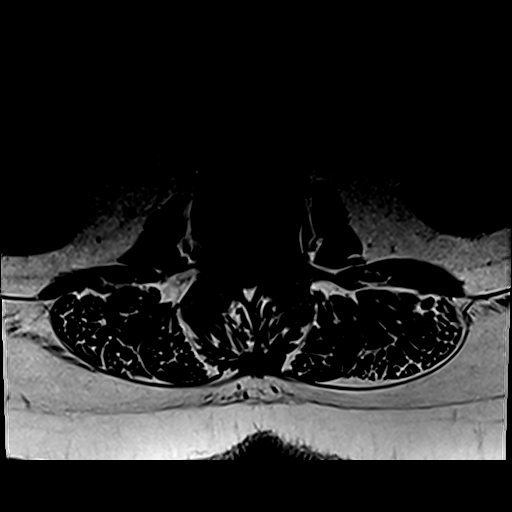
[im 29/29]
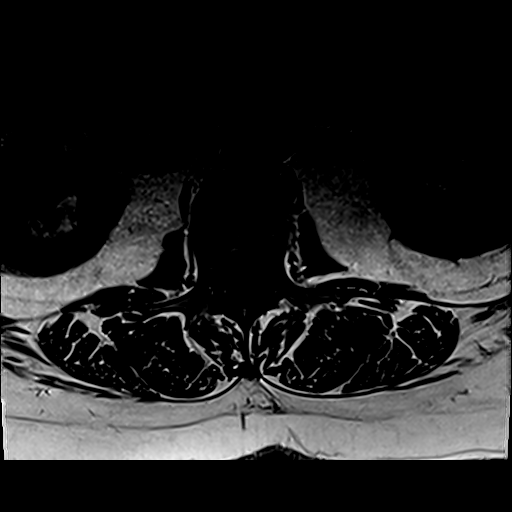

[31 of 48 positions shown; findings below may reference images not displayed]

FINDINGS: Segmentation:  Normal on the comparison.

Alignment:  Stable, normal lumbar lordosis.

Vertebrae: No marrow edema or evidence of acute osseous abnormality.
Visualized bone marrow signal is within normal limits. Intact
visible sacrum and SI joints.

Conus medullaris and cauda equina: Conus extends to the T12-L1
level. No lower spinal cord or conus signal abnormality.

Paraspinal and other soft tissues: Diverticulosis of the large bowel
in the pelvis. Otherwise negative.

Disc levels:

T12-L1:  Negative.

L1-L2:  Subtle disc desiccation, disc bulging.  No stenosis.

L2-L3: Subtle disc desiccation and disc bulging. Similar mild right
facet hypertrophy. No stenosis.

L3-L4: Subtle disc desiccation, disc bulging and facet hypertrophy.
Borderline to mild bilateral L3 neural foraminal stenosis.

L4-L5: Mild disc desiccation and circumferential disc bulge. Mild
facet and ligament flavum hypertrophy greater on the right.
Borderline to mild L4 foraminal stenosis greater on the right.

L5-S1: Disc desiccation. Complex roughly 10 mm disc extrusion into
the left lateral recess (series 5, image 10 and series 8, image 24).
Superimposed leftward disc bulging and endplate spurring. Mild
bilateral facet hypertrophy. Severe left lateral recess stenosis
(left S1 nerve level). No spinal stenosis. Mild left L5 foraminal
stenosis.
IMPRESSION: 1. Symptomatic level appears to be L5-S1 where a moderate-sized
extrusion into the left lateral recess results in severe stenosis.
Query Left S1 radiculitis.
2. Mild for age lumbar spine degeneration elsewhere.

## 2021-03-15 NOTE — Addendum Note (Signed)
Addended by: Dairl Ponder on: 03/15/2021 03:54 PM   Modules accepted: Orders

## 2021-04-02 ENCOUNTER — Emergency Department (HOSPITAL_COMMUNITY): Payer: 59

## 2021-04-02 ENCOUNTER — Encounter (HOSPITAL_COMMUNITY): Payer: Self-pay | Admitting: *Deleted

## 2021-04-02 ENCOUNTER — Other Ambulatory Visit: Payer: Self-pay

## 2021-04-02 ENCOUNTER — Emergency Department (HOSPITAL_COMMUNITY)
Admission: EM | Admit: 2021-04-02 | Discharge: 2021-04-02 | Disposition: A | Discharge status: Home / Self Care | Payer: 59 | Attending: Emergency Medicine | Admitting: Emergency Medicine

## 2021-04-02 DIAGNOSIS — T50905A Adverse effect of unspecified drugs, medicaments and biological substances, initial encounter: Secondary | ICD-10-CM | POA: Insufficient documentation

## 2021-04-02 DIAGNOSIS — R0602 Shortness of breath: Secondary | ICD-10-CM | POA: Insufficient documentation

## 2021-04-02 DIAGNOSIS — M79605 Pain in left leg: Secondary | ICD-10-CM | POA: Diagnosis not present

## 2021-04-02 DIAGNOSIS — K219 Gastro-esophageal reflux disease without esophagitis: Secondary | ICD-10-CM | POA: Diagnosis not present

## 2021-04-02 DIAGNOSIS — Z7984 Long term (current) use of oral hypoglycemic drugs: Secondary | ICD-10-CM | POA: Diagnosis not present

## 2021-04-02 DIAGNOSIS — R1110 Vomiting, unspecified: Secondary | ICD-10-CM | POA: Insufficient documentation

## 2021-04-02 DIAGNOSIS — R079 Chest pain, unspecified: Secondary | ICD-10-CM

## 2021-04-02 DIAGNOSIS — R0789 Other chest pain: Secondary | ICD-10-CM | POA: Diagnosis present

## 2021-04-02 DIAGNOSIS — E119 Type 2 diabetes mellitus without complications: Secondary | ICD-10-CM | POA: Diagnosis not present

## 2021-04-02 LAB — TROPONIN I (HIGH SENSITIVITY)
Troponin I (High Sensitivity): 3 ng/L (ref <18)
Troponin I (High Sensitivity): 4 ng/L (ref <18)

## 2021-04-02 LAB — COMPREHENSIVE METABOLIC PANEL
ALT: 35 U/L (ref 0–44)
AST: 55 U/L — ABNORMAL HIGH (ref 15–41)
Albumin: 3.7 g/dL (ref 3.5–5.0)
Alkaline Phosphatase: 51 U/L (ref 38–126)
Anion gap: 10 (ref 5–15)
BUN: 13 mg/dL (ref 6–20)
CO2: 27 mmol/L (ref 22–32)
Calcium: 8.8 mg/dL — ABNORMAL LOW (ref 8.9–10.3)
Chloride: 100 mmol/L (ref 98–111)
Creatinine, Ser: 0.66 mg/dL (ref 0.44–1.00)
GFR, Estimated: >60 mL/min (ref >60)
Glucose, Bld: 252 mg/dL — ABNORMAL HIGH (ref 70–99)
Potassium: 3.5 mmol/L (ref 3.5–5.1)
Sodium: 137 mmol/L (ref 135–145)
Total Bilirubin: 0.9 mg/dL (ref 0.3–1.2)
Total Protein: 6.4 g/dL — ABNORMAL LOW (ref 6.5–8.1)

## 2021-04-02 LAB — CBC WITH DIFFERENTIAL/PLATELET
Abs Immature Granulocytes: 0.06 10*3/uL (ref 0.00–0.07)
Basophils Absolute: 0 10*3/uL (ref 0.0–0.1)
Basophils Relative: 0 %
Eosinophils Absolute: 0 10*3/uL (ref 0.0–0.5)
Eosinophils Relative: 0 %
HCT: 43.9 % (ref 36.0–46.0)
Hemoglobin: 14.3 g/dL (ref 12.0–15.0)
Immature Granulocytes: 1 %
Lymphocytes Relative: 9 %
Lymphs Abs: 1 10*3/uL (ref 0.7–4.0)
MCH: 28.6 pg (ref 26.0–34.0)
MCHC: 32.6 g/dL (ref 30.0–36.0)
MCV: 87.8 fL (ref 80.0–100.0)
Monocytes Absolute: 0.6 10*3/uL (ref 0.1–1.0)
Monocytes Relative: 6 %
Neutro Abs: 9.9 10*3/uL — ABNORMAL HIGH (ref 1.7–7.7)
Neutrophils Relative %: 84 %
Platelets: 281 10*3/uL (ref 150–400)
RBC: 5 MIL/uL (ref 3.87–5.11)
RDW: 12.3 % (ref 11.5–15.5)
WBC: 11.7 10*3/uL — ABNORMAL HIGH (ref 4.0–10.5)
nRBC: 0 % (ref 0.0–0.2)

## 2021-04-02 LAB — LIPASE, BLOOD: Lipase: 108 U/L — ABNORMAL HIGH (ref 11–51)

## 2021-04-02 IMAGING — DX DG CHEST 1V PORT
1 series · 1 of 1 positions shown · non-contrast
Comparison: [DATE].

CLINICAL DATA: Pt brought in by [facility] for c/o chest pain; pt had
back surgery this am; pt took percocet, vicodin around 5pm; pt
started vomiting and having difficulty breathing; pt has redness all
over body; benadryl 50mg, albuterol 5mg given by ems; pt has
swelling to tongue; tachy and hypertensive with ems

EXAM:
PORTABLE CHEST 1 VIEW

[chest ap]
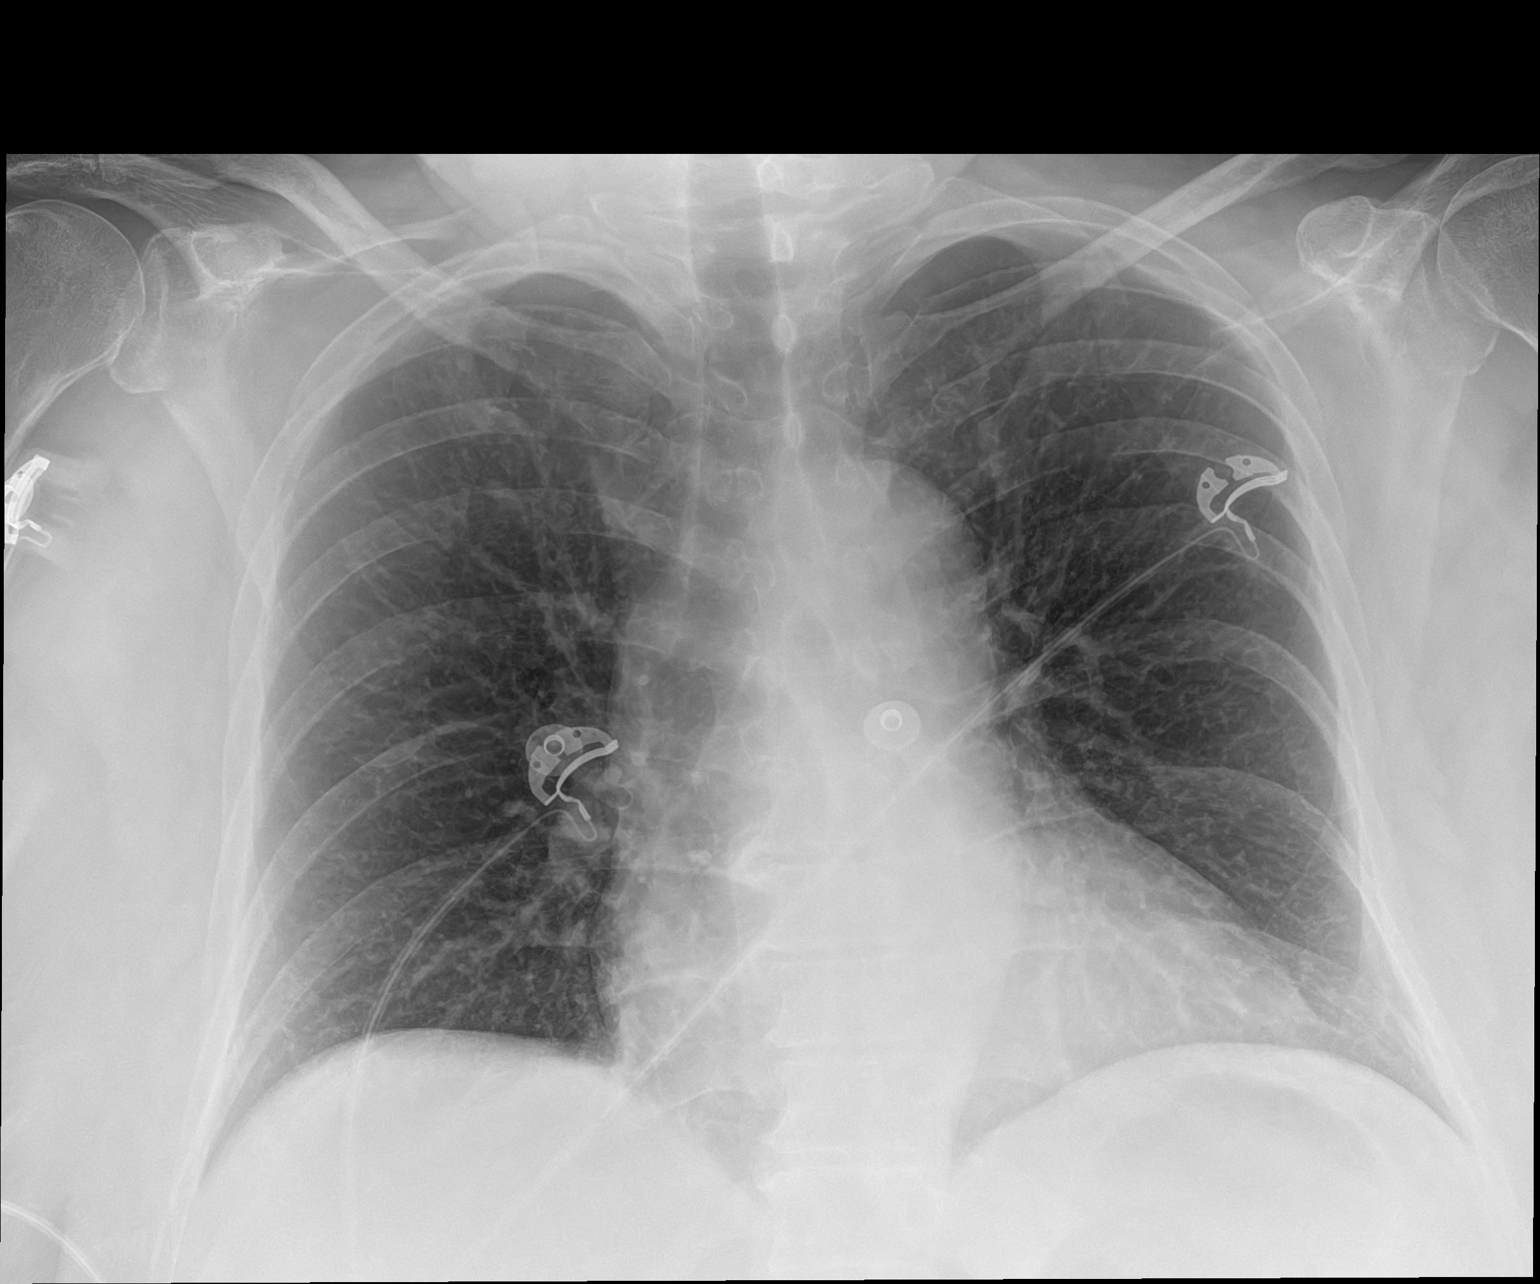

[1 of 1 positions shown; findings below may reference images not displayed]

FINDINGS: Cardiac silhouette is normal in size. No mediastinal or hilar
masses.

Clear lungs.  No convincing pleural effusion.  No pneumothorax.

Skeletal structures are grossly intact.
IMPRESSION: No active disease.

## 2021-04-02 IMAGING — CT CT ABD-PELV W/ CM
2 of 5 series · 15 of 46 positions shown, 17 images · IV contrast (Omnipaque or Isovue)
Comparison: CT of the abdomen pelvis dated [DATE]. Chest
radiograph dated [DATE].

CLINICAL DATA: 60-year-old female with abdominal pain. Concern for
acute pancreatitis.

EXAM:
CT ANGIOGRAPHY CHEST
CT ABDOMEN AND PELVIS WITH CONTRAST
TECHNIQUE: Multidetector CT imaging of the chest was performed using the
standard protocol during bolus administration of intravenous
contrast. Multiplanar CT image reconstructions and MIPs were
obtained to evaluate the vascular anatomy. Multidetector CT imaging
of the abdomen and pelvis was performed using the standard protocol
during bolus administration of intravenous contrast.
CONTRAST:  100mL OMNIPAQUE IOHEXOL 350 MG/ML SOLN

[Series 2: axial st · axial · 0.81mm/px · z∈[+649,+1039]mm · 12 of 88 slices shown, 14 images]
[im 5/88  soft-tissue]
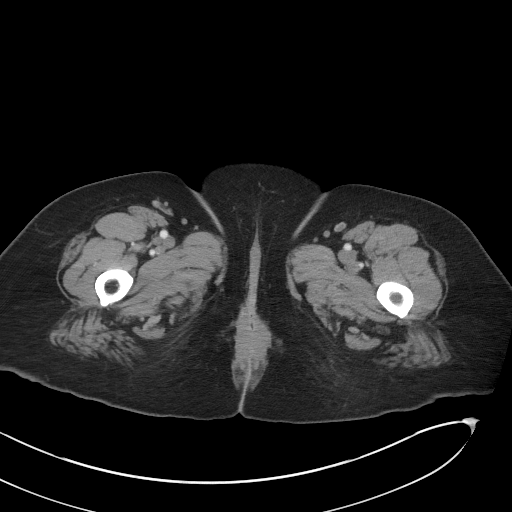
[im 5/88  bone]
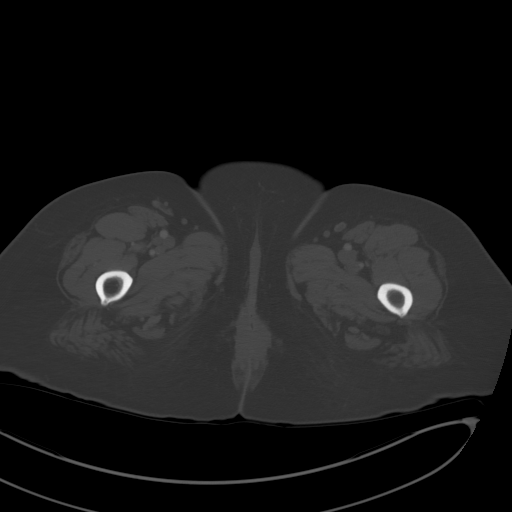
[im 14/88  soft-tissue]
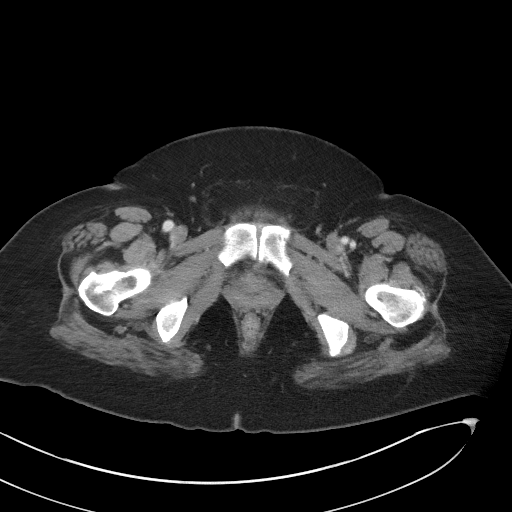
[im 19/88  soft-tissue]
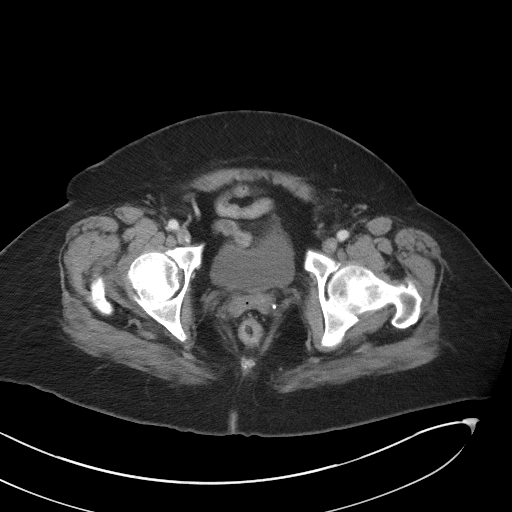
[im 28/88  soft-tissue]
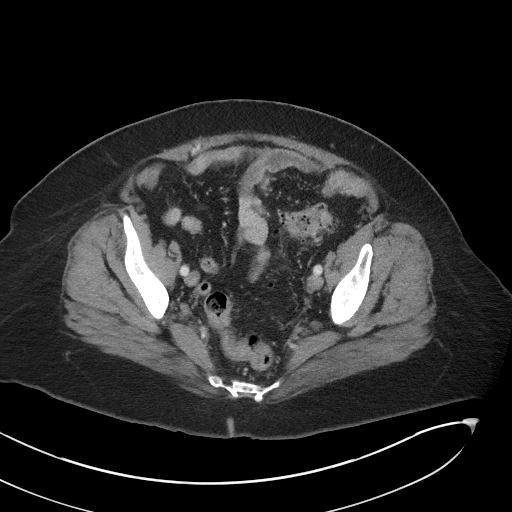
[im 33/88  soft-tissue]
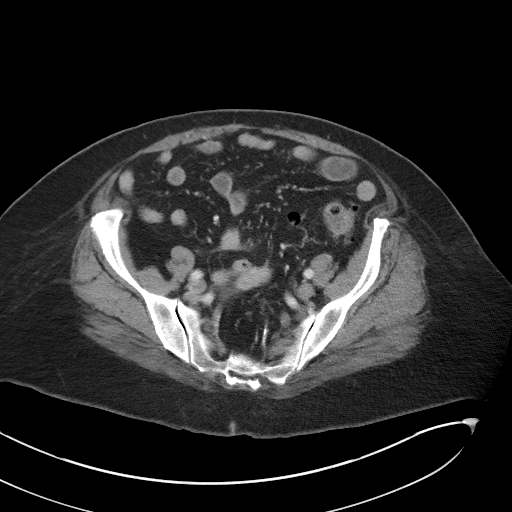
[im 42/88  soft-tissue]
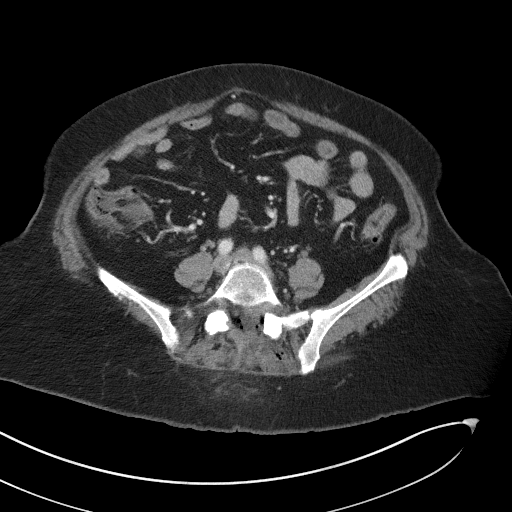
[im 46/88  soft-tissue]
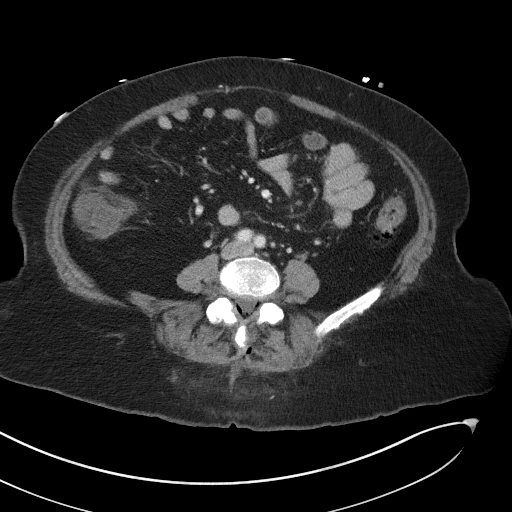
[im 55/88  soft-tissue]
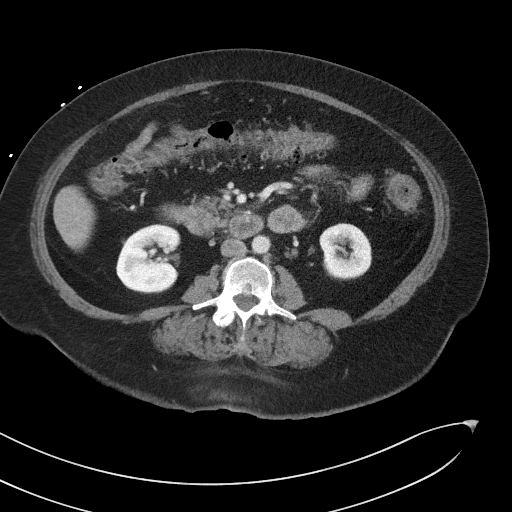
[im 60/88  soft-tissue]
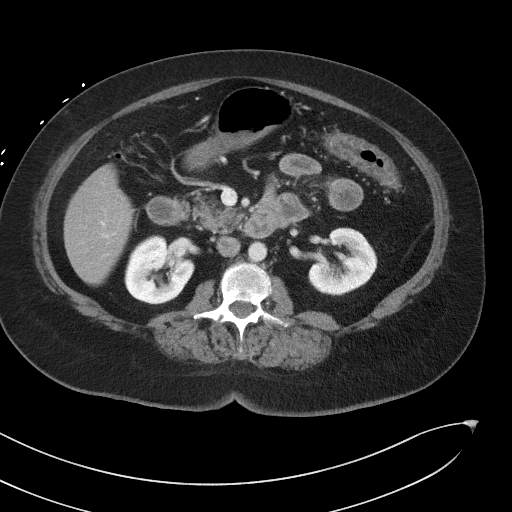
[im 60/88  bone]
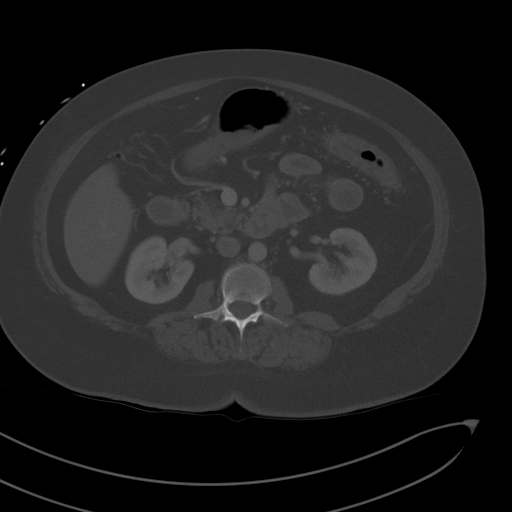
[im 69/88  soft-tissue]
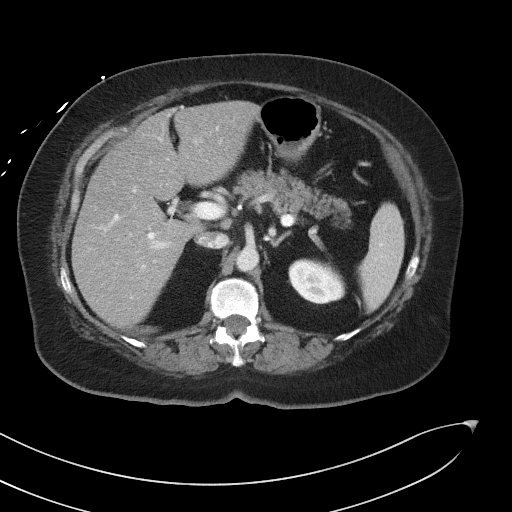
[im 74/88  soft-tissue]
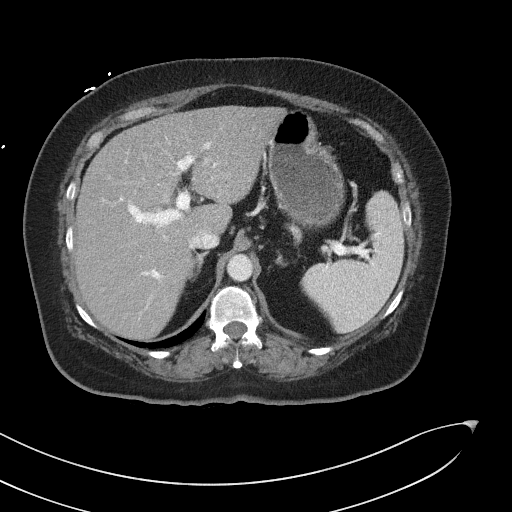
[im 83/88  soft-tissue]
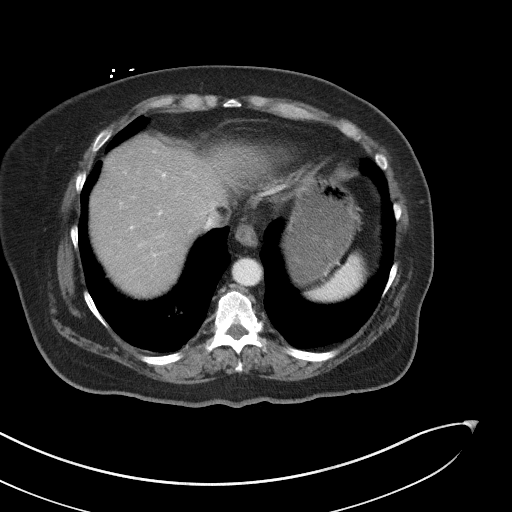

[Series 5: coronal st · coronal · 0.83mm/px · 3 of 116 slices shown]
[im 39/116  soft-tissue]
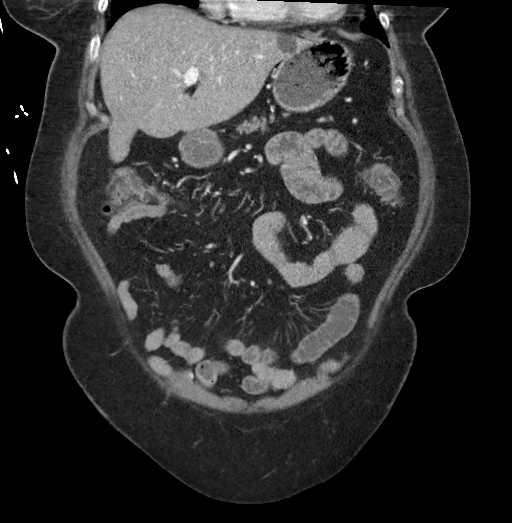
[im 52/116  soft-tissue]
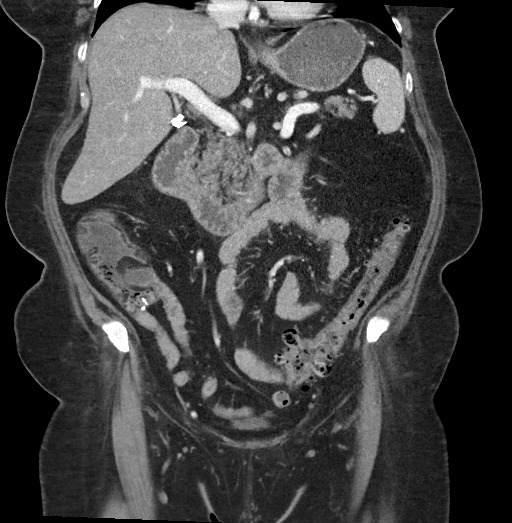
[im 64/116  soft-tissue]
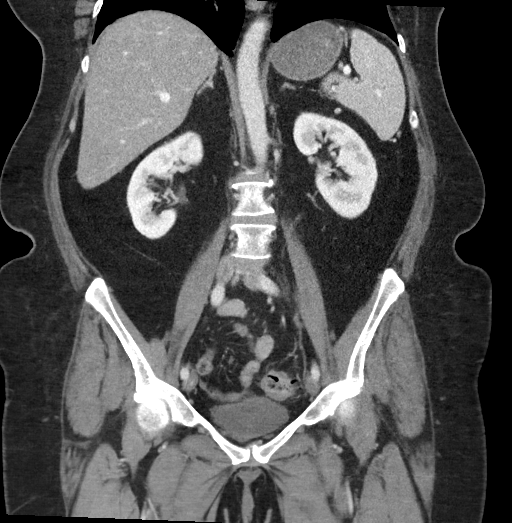

[15 of 46 positions shown; findings below may reference images not displayed]

FINDINGS: CTA CHEST FINDINGS

Cardiovascular: There is mild cardiomegaly. No pericardial effusion.
The thoracic aorta is unremarkable. The origins of the great vessels
of the arch appear patent. Mild dilatation of the main pulmonary
trunk suggestive of pulmonary hypertension. No pulmonary artery
embolus identified.

Mediastinum/Nodes: No hilar or mediastinal adenopathy. The esophagus
and thyroid gland are grossly unremarkable. No mediastinal fluid
collection.

Lungs/Pleura: No focal consolidation, pleural effusion, or
pneumothorax. The central airways are patent.

Musculoskeletal: No chest wall abnormality. No acute or significant
osseous findings.

Review of the MIP images confirms the above findings.

CT ABDOMEN and PELVIS FINDINGS

No intra-abdominal free air. Trace free fluid in the pelvis.

Hepatobiliary: Fatty infiltration of the liver. There is a 2 cm left
hepatic lobe cyst. No intrahepatic biliary dilatation.
Cholecystectomy. No retained calcified stone noted in the central
CBD.

Pancreas: Unremarkable. No pancreatic ductal dilatation or
surrounding inflammatory changes.

Spleen: Normal in size without focal abnormality.

Adrenals/Urinary Tract: The adrenal glands unremarkable. There is no
hydronephrosis on either side. There is symmetric enhancement and
excretion of contrast by both kidneys. The visualized ureters and
urinary bladder appear unremarkable.

Stomach/Bowel: There is severe sigmoid and pancolonic diverticulosis
without active inflammatory changes. Diffuse thickened appearance of
the colon, likely related to underdistention. Mild colitis is not
excluded. Clinical correlation is recommended. There is no bowel
obstruction. Appendectomy.

Vascular/Lymphatic: The abdominal aorta and IVC are unremarkable. No
portal venous gas. There is no adenopathy.

Reproductive: Hysterectomy. No adnexal masses.

Other: None

Musculoskeletal: Osteopenia with degenerative changes of the spine.
Epidural air in the central canal at L4-L5. There is a small fluid
and surrounding subcutaneous stranding in the subcutaneous soft
tissues of the lower back, likely related to recent procedure. No
acute osseous pathology.

Review of the MIP images confirms the above findings.
IMPRESSION: 1. No acute intrathoracic pathology. No CT evidence of pulmonary
artery embolus.
2. Severe sigmoid and pancolonic diverticulosis.
3. Underdistention of the colon versus less likely mild colitis.
Clinical correlation is recommended. No bowel obstruction.
4. Fatty liver.

## 2021-04-02 IMAGING — CT CT ANGIO CHEST
2 of 6 series · 17 of 46 positions shown · IV contrast (omnipaque)
Comparison: CT of the abdomen pelvis dated [DATE]. Chest
radiograph dated [DATE].

CLINICAL DATA: 60-year-old female with abdominal pain. Concern for
acute pancreatitis.

EXAM:
CT ANGIOGRAPHY CHEST
CT ABDOMEN AND PELVIS WITH CONTRAST
TECHNIQUE: Multidetector CT imaging of the chest was performed using the
standard protocol during bolus administration of intravenous
contrast. Multiplanar CT image reconstructions and MIPs were
obtained to evaluate the vascular anatomy. Multidetector CT imaging
of the abdomen and pelvis was performed using the standard protocol
during bolus administration of intravenous contrast.
CONTRAST:  100mL OMNIPAQUE IOHEXOL 350 MG/ML SOLN

[Series 5: pe axial thins · axial · 0.68mm/px · z∈[+990,+1235]mm · 14 of 334 slices shown]
[im 14/334  lung]
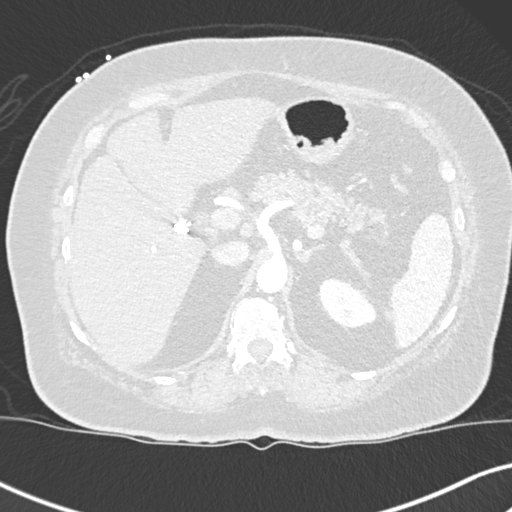
[im 42/334  soft-tissue]
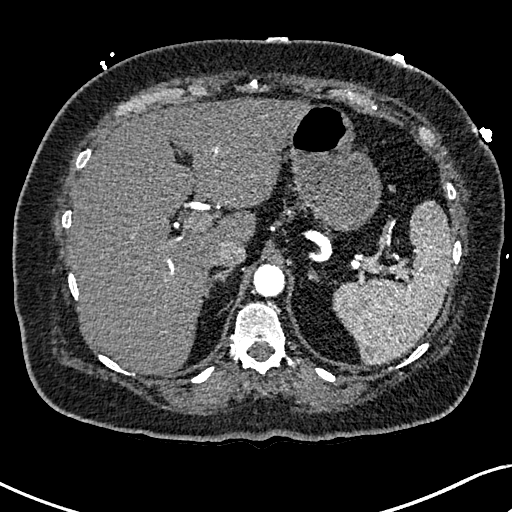
[im 70/334  lung]
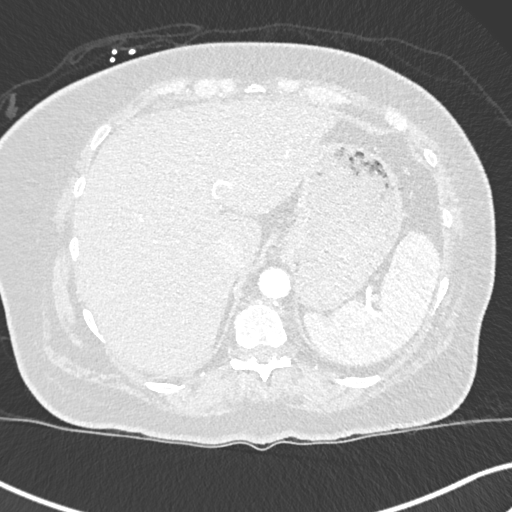
[im 84/334  soft-tissue]
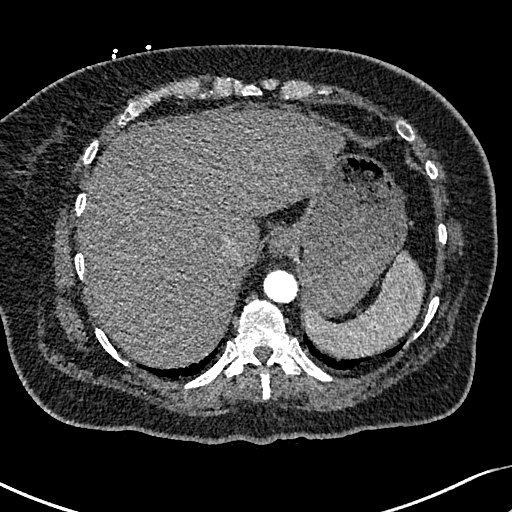
[im 112/334  lung]
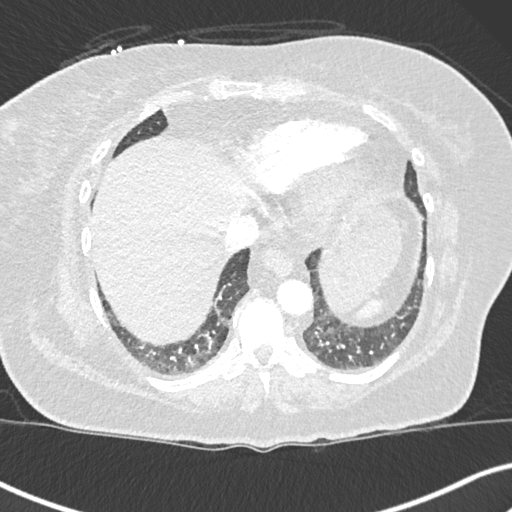
[im 139/334  soft-tissue]
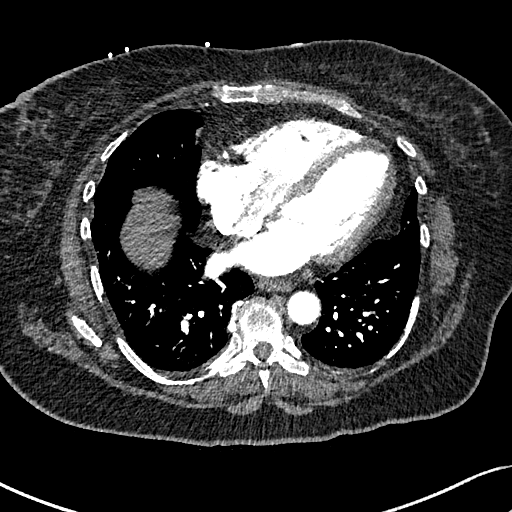
[im 153/334  lung]
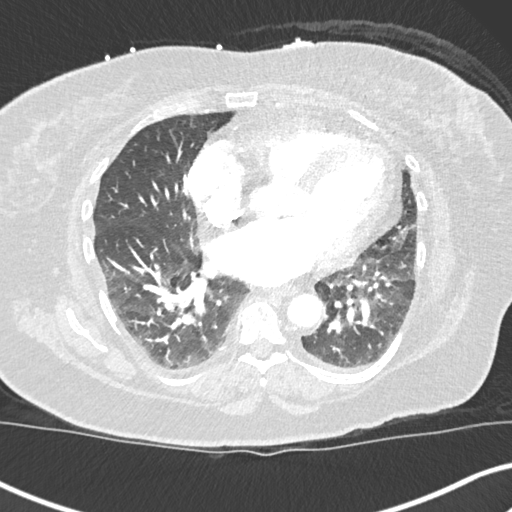
[im 181/334  soft-tissue]
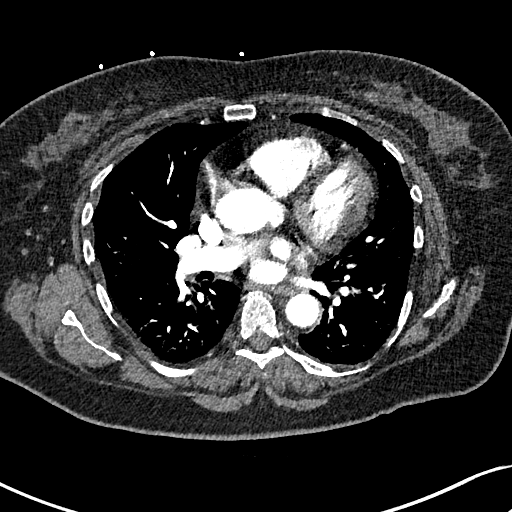
[im 195/334  lung]
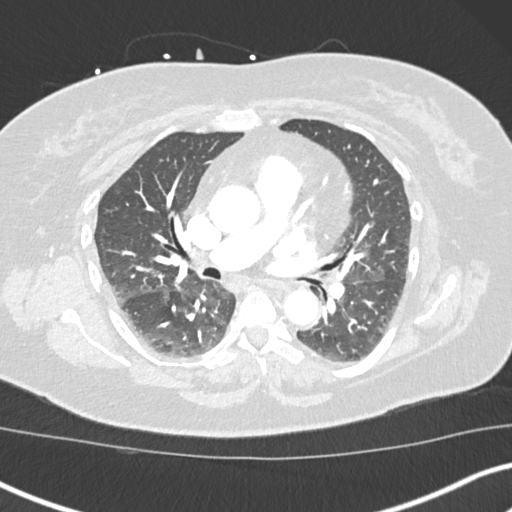
[im 223/334  soft-tissue]
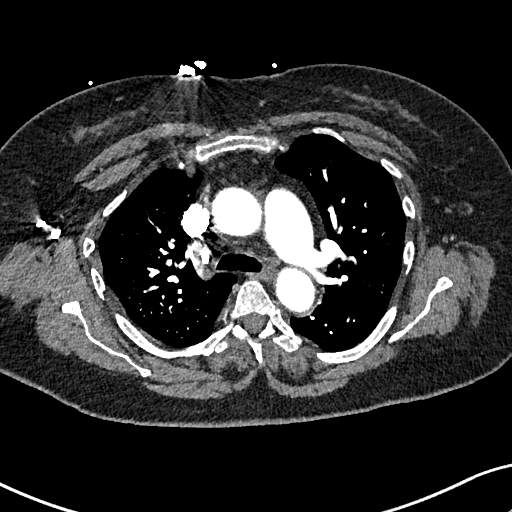
[im 250/334  lung]
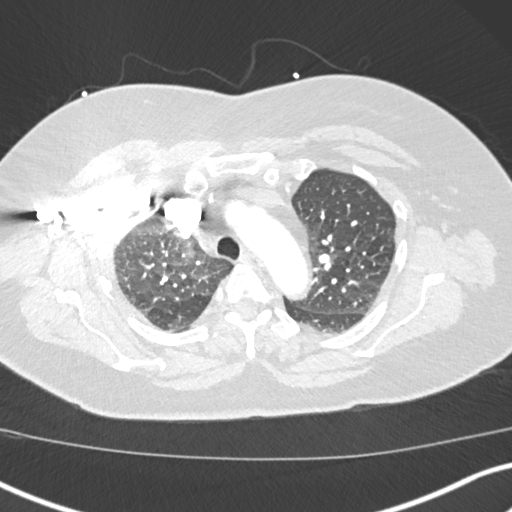
[im 264/334  soft-tissue]
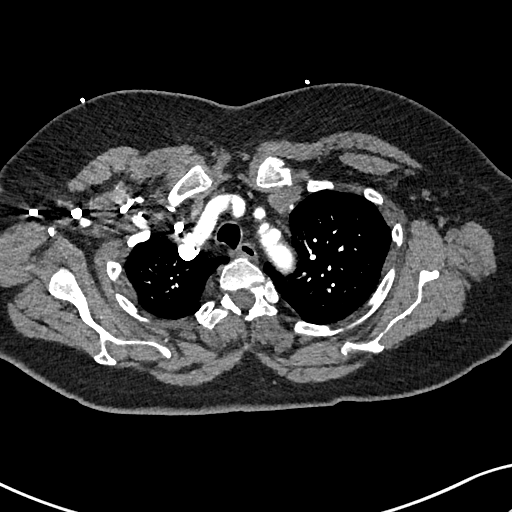
[im 292/334  lung]
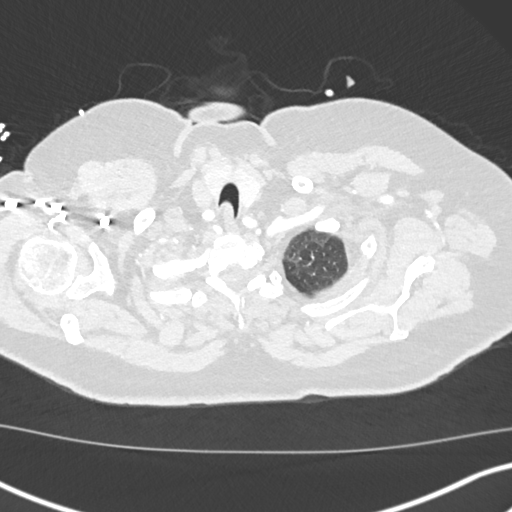
[im 320/334  soft-tissue]
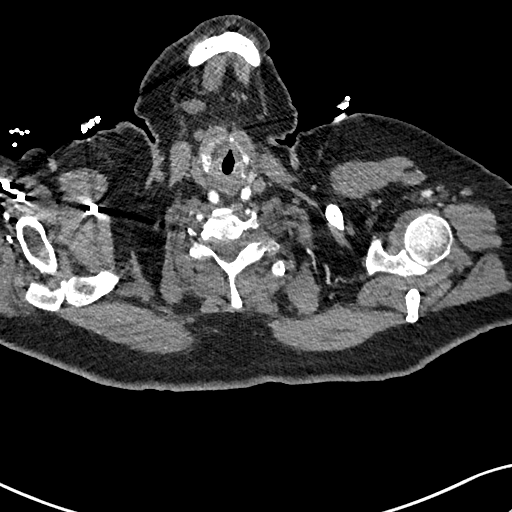

[Series 7: cor soft · coronal · 0.55mm/px · 3 of 151 slices shown]
[im 38/151  soft-tissue]
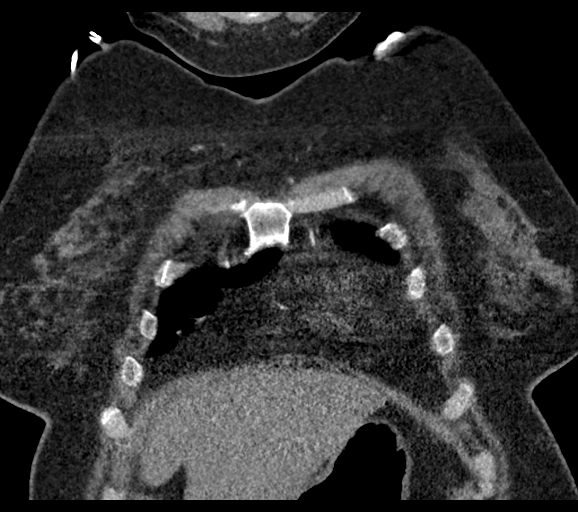
[im 76/151  soft-tissue]
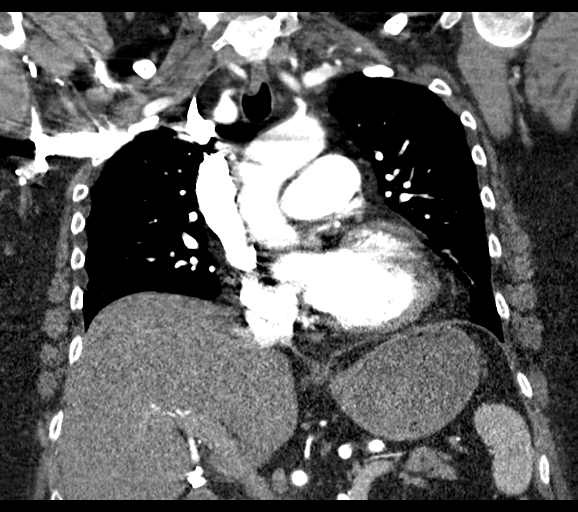
[im 113/151  soft-tissue]
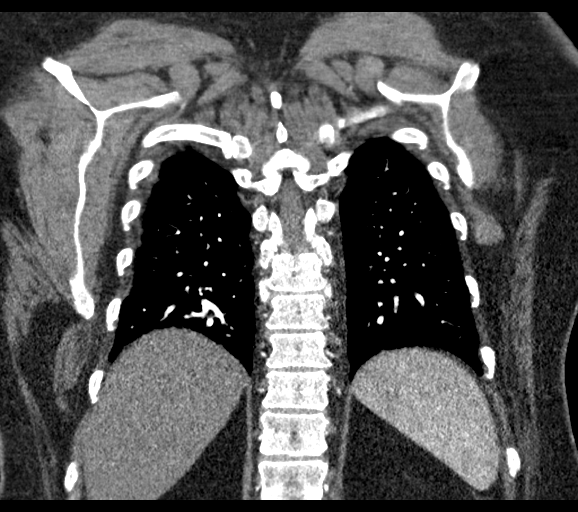

[17 of 46 positions shown; findings below may reference images not displayed]

FINDINGS: CTA CHEST FINDINGS

Cardiovascular: There is mild cardiomegaly. No pericardial effusion.
The thoracic aorta is unremarkable. The origins of the great vessels
of the arch appear patent. Mild dilatation of the main pulmonary
trunk suggestive of pulmonary hypertension. No pulmonary artery
embolus identified.

Mediastinum/Nodes: No hilar or mediastinal adenopathy. The esophagus
and thyroid gland are grossly unremarkable. No mediastinal fluid
collection.

Lungs/Pleura: No focal consolidation, pleural effusion, or
pneumothorax. The central airways are patent.

Musculoskeletal: No chest wall abnormality. No acute or significant
osseous findings.

Review of the MIP images confirms the above findings.

CT ABDOMEN and PELVIS FINDINGS

No intra-abdominal free air. Trace free fluid in the pelvis.

Hepatobiliary: Fatty infiltration of the liver. There is a 2 cm left
hepatic lobe cyst. No intrahepatic biliary dilatation.
Cholecystectomy. No retained calcified stone noted in the central
CBD.

Pancreas: Unremarkable. No pancreatic ductal dilatation or
surrounding inflammatory changes.

Spleen: Normal in size without focal abnormality.

Adrenals/Urinary Tract: The adrenal glands unremarkable. There is no
hydronephrosis on either side. There is symmetric enhancement and
excretion of contrast by both kidneys. The visualized ureters and
urinary bladder appear unremarkable.

Stomach/Bowel: There is severe sigmoid and pancolonic diverticulosis
without active inflammatory changes. Diffuse thickened appearance of
the colon, likely related to underdistention. Mild colitis is not
excluded. Clinical correlation is recommended. There is no bowel
obstruction. Appendectomy.

Vascular/Lymphatic: The abdominal aorta and IVC are unremarkable. No
portal venous gas. There is no adenopathy.

Reproductive: Hysterectomy. No adnexal masses.

Other: None

Musculoskeletal: Osteopenia with degenerative changes of the spine.
Epidural air in the central canal at L4-L5. There is a small fluid
and surrounding subcutaneous stranding in the subcutaneous soft
tissues of the lower back, likely related to recent procedure. No
acute osseous pathology.

Review of the MIP images confirms the above findings.
IMPRESSION: 1. No acute intrathoracic pathology. No CT evidence of pulmonary
artery embolus.
2. Severe sigmoid and pancolonic diverticulosis.
3. Underdistention of the colon versus less likely mild colitis.
Clinical correlation is recommended. No bowel obstruction.
4. Fatty liver.

## 2021-04-02 MED ORDER — IOHEXOL 350 MG/ML SOLN
100.0000 mL | Freq: Once | INTRAVENOUS | Status: AC | PRN
  Administered 2021-04-02: 100 mL via INTRAVENOUS

## 2021-04-02 MED ORDER — FENTANYL CITRATE (PF) 100 MCG/2ML IJ SOLN
50.0000 ug | Freq: Once | INTRAMUSCULAR | Status: AC
  Administered 2021-04-02: 50 ug via INTRAVENOUS
  Filled 2021-04-02: qty 2

## 2021-04-02 MED ORDER — SODIUM CHLORIDE 0.9 % IV BOLUS
1000.0000 mL | Freq: Once | INTRAVENOUS | Status: AC
  Administered 2021-04-02: 1000 mL via INTRAVENOUS

## 2021-04-02 NOTE — ED Triage Notes (Signed)
Pt brought in by [facility] for c/o chest pain; pt had back surgery this am; pt took percocet, vicodin around 5pm; pt started vomiting and having difficulty breathing; pt has redness all over body; benadryl 50mg , albuterol 5mg  given by ems; pt has swelling to tongue; tachy and hypertensive with ems

## 2021-04-02 NOTE — ED Provider Notes (Signed)
[facility] EMERGENCY DEPARTMENT Provider Note   CSN: [id number] Arrival date & time: 04/02/21  1734     History Chief Complaint  Patient presents with   Chest Pain    Molly Ryan is a 60 y.o. female.  HPI 60 year old female presents with severe chest pain and concern for allergic reaction.  History is both from EMS as well as patient.  She had a microdiscectomy this morning by Dr. Ronnald Ramp.  Earlier this afternoon she was discharged and was feeling well.  Went to the bathroom to have a bowel movement shortly after taking 2 of her meds, which she states were Vicodin and Percocet.  She then proceeded to have an episode of vomiting and got weak to the point that she had to lower herself to the ground.  Then started having severe chest pain like a pressure and crushing in her chest.  Was also short of breath.  She never felt like she had lip or tongue swelling or trouble swallowing but her mouth felt extremely dry.  EMS noted some redness around her face and under her bra line and treated her with Benadryl and albuterol (no wheezing heard by EMS).  She does feel little bit better with this.  Currently she is having a lot of left leg pain that is similar to the reason she got the microdiscectomy.  The chest pain is still present but much improved.  Dyspnea seems to have improved. No incontinence or numbness.  Past Medical History:  Diagnosis Date   Chronic insomnia    Migraine headache    Reflux     Patient Active Problem List   Diagnosis Date Noted   Right ear pain 12/25/2020   Nausea 12/25/2020   Acute sinusitis 12/25/2020   Acute diverticulitis 12/24/2017   Diverticulitis 12/23/2017   Vitamin D deficiency 11/18/2016   Type 2 diabetes mellitus without complication, without long-term current use of insulin ([place]) 09/14/2015   Morbid obesity due to excess calories ([place]) 09/11/2015   Diverticulitis of colon without hemorrhage 04/02/2015   Migraine headache with aura 07/15/2013    Chronic insomnia 07/15/2013   Depression with anxiety 07/15/2013   Migraines 03/18/2013    Past Surgical History:  Procedure Laterality Date   APPENDECTOMY     CHOLECYSTECTOMY     LAPAROSCOPIC BILATERAL SALPINGO OOPHERECTOMY Bilateral 2014   VAGINAL HYSTERECTOMY  2006     OB History   No obstetric history on file.     Family History  Problem Relation Age of Onset   Hypertension Brother    Cancer Brother        lung   Cancer Mother        lung   COPD Mother    Diabetes Maternal Grandmother     Social History   Tobacco Use   Smoking status: Never   Smokeless tobacco: Never  Substance Use Topics   Alcohol use: Yes    Comment: occassionally   Drug use: No    Home Medications Prior to Admission medications   Medication Sig Start Date End Date Taking? Authorizing Provider  citalopram (CELEXA) 20 MG tablet TAKE 1 TABLET(20 MG) BY MOUTH DAILY 02/02/21   Kathyrn Drown, MD  diclofenac (VOLTAREN) 50 MG EC tablet Take 1 tablet (50 mg total) by mouth 2 (two) times daily. Patient not taking: Reported on 02/23/2021 02/02/21   Kathyrn Drown, MD  Eszopiclone 3 MG TABS Take immediately before bedtime 02/02/21   Kathyrn Drown, MD  meclizine (  ANTIVERT) 25 MG tablet Take one tablet every 6 hours prn dizziness. Caution drowiness 12/25/20   Chalmers Guest, NP  metFORMIN (GLUCOPHAGE) 500 MG tablet TAKE 2 TABLETS BY MOUTH TWICE DAILY 01/12/21   Kathyrn Drown, MD  ondansetron (ZOFRAN ODT) 4 MG disintegrating tablet Take 1 tablet (4 mg total) by mouth every 8 (eight) hours as needed for nausea or vomiting. 12/25/20   Chalmers Guest, NP  pravastatin (PRAVACHOL) 40 MG tablet TAKE 1 TABLET BY MOUTH EVERY MONDAY, WEDNESDAY, AND FRIDAY 02/23/21   Kathyrn Drown, MD  rizatriptan (MAXALT-MLT) 10 MG disintegrating tablet Take 1 tablet (10 mg total) by mouth as needed for migraine. May repeat in 2 hours if needed 06/30/20   Kathyrn Drown, MD  TRULICITY 1.5 FH/5.4TG SOPN ADMINISTER 1.5 MG UNDER THE  SKIN 1 TIME A WEEK 01/24/21   Kathyrn Drown, MD    Allergies    Morphine and related and Amoxicillin  Review of Systems   Review of Systems  Respiratory:  Positive for shortness of breath.   Cardiovascular:  Positive for chest pain.  Gastrointestinal:  Positive for nausea and vomiting.  Musculoskeletal:        Leg pain Back is sore  Neurological:  Negative for weakness and numbness.  All other systems reviewed and are negative.  Physical Exam Updated Vital Signs BP 129/76   Pulse 75   Temp 97.7 F (36.5 C) (Oral)   Resp 17   Ht 5' (1.524 m)   Wt 84.4 kg   SpO2 100%   BMI 36.33 kg/m   Physical Exam Vitals and nursing note reviewed.  Constitutional:      General: She is in acute distress.     Appearance: She is well-developed.  HENT:     Head: Normocephalic and atraumatic.     Right Ear: External ear normal.     Left Ear: External ear normal.     Nose: Nose normal.     Mouth/Throat:     Mouth: Mucous membranes are dry.     Comments: No lip, tongue, or oropharyngeal swelling. Normal voice Eyes:     General:        Right eye: No discharge.        Left eye: No discharge.  Cardiovascular:     Rate and Rhythm: Normal rate and regular rhythm.     Heart sounds: Normal heart sounds.  Pulmonary:     Effort: Pulmonary effort is normal.     Breath sounds: Normal breath sounds. No decreased breath sounds, wheezing, rhonchi or rales.  Abdominal:     Palpations: Abdomen is soft.     Tenderness: There is no abdominal tenderness.  Skin:    General: Skin is warm and dry.     Findings: No rash.  Neurological:     Mental Status: She is alert.  Psychiatric:        Mood and Affect: Mood is not anxious.    ED Results / Procedures / Treatments   Labs (all labs ordered are listed, but only abnormal results are displayed) Labs Reviewed  COMPREHENSIVE METABOLIC PANEL - Abnormal; Notable for the following components:      Result Value   Glucose, Bld 252 (*)    Calcium 8.8  (*)    Total Protein 6.4 (*)    AST 55 (*)    All other components within normal limits  LIPASE, BLOOD - Abnormal; Notable for the following components:   Lipase 108 (*)  All other components within normal limits  CBC WITH DIFFERENTIAL/PLATELET - Abnormal; Notable for the following components:   WBC 11.7 (*)    Neutro Abs 9.9 (*)    All other components within normal limits  TROPONIN I (HIGH SENSITIVITY)  TROPONIN I (HIGH SENSITIVITY)    EKG EKG Interpretation  Date/Time:  Monday April 02 2021 17:55:03 EDT Ventricular Rate:  74 PR Interval:  146 QRS Duration: 80 QT Interval:  404 QTC Calculation: 448 R Axis:   -68 Text Interpretation: Normal sinus rhythm Left axis deviation Low voltage QRS no acute ischemia similar to 2019 Confirmed by Sherwood Gambler [phone]) on 04/02/2021 5:59:23 PM  Radiology CT Angio Chest PE W and/or Wo Contrast  Result Date: 04/02/2021 CLINICAL DATA:  60 year old female with abdominal pain. Concern for acute pancreatitis. EXAM: CT ANGIOGRAPHY CHEST CT ABDOMEN AND PELVIS WITH CONTRAST TECHNIQUE: Multidetector CT imaging of the chest was performed using the standard protocol during bolus administration of intravenous contrast. Multiplanar CT image reconstructions and MIPs were obtained to evaluate the vascular anatomy. Multidetector CT imaging of the abdomen and pelvis was performed using the standard protocol during bolus administration of intravenous contrast. CONTRAST:  162mL OMNIPAQUE IOHEXOL 350 MG/ML SOLN COMPARISON:  CT of the abdomen pelvis dated 12/23/2017. Chest radiograph dated 04/02/2021. FINDINGS: CTA CHEST FINDINGS Cardiovascular: There is mild cardiomegaly. No pericardial effusion. The thoracic aorta is unremarkable. The origins of the great vessels of the arch appear patent. Mild dilatation of the main pulmonary trunk suggestive of pulmonary hypertension. No pulmonary artery embolus identified. Mediastinum/Nodes: No hilar or mediastinal adenopathy.  The esophagus and thyroid gland are grossly unremarkable. No mediastinal fluid collection. Lungs/Pleura: No focal consolidation, pleural effusion, or pneumothorax. The central airways are patent. Musculoskeletal: No chest wall abnormality. No acute or significant osseous findings. Review of the MIP images confirms the above findings. CT ABDOMEN and PELVIS FINDINGS No intra-abdominal free air. Trace free fluid in the pelvis. Hepatobiliary: Fatty infiltration of the liver. There is a 2 cm left hepatic lobe cyst. No intrahepatic biliary dilatation. Cholecystectomy. No retained calcified stone noted in the central CBD. Pancreas: Unremarkable. No pancreatic ductal dilatation or surrounding inflammatory changes. Spleen: Normal in size without focal abnormality. Adrenals/Urinary Tract: The adrenal glands unremarkable. There is no hydronephrosis on either side. There is symmetric enhancement and excretion of contrast by both kidneys. The visualized ureters and urinary bladder appear unremarkable. Stomach/Bowel: There is severe sigmoid and pancolonic diverticulosis without active inflammatory changes. Diffuse thickened appearance of the colon, likely related to underdistention. Mild colitis is not excluded. Clinical correlation is recommended. There is no bowel obstruction. Appendectomy. Vascular/Lymphatic: The abdominal aorta and IVC are unremarkable. No portal venous gas. There is no adenopathy. Reproductive: Hysterectomy. No adnexal masses. Other: None Musculoskeletal: Osteopenia with degenerative changes of the spine. Epidural air in the central canal at L4-L5. There is a small fluid and surrounding subcutaneous stranding in the subcutaneous soft tissues of the lower back, likely related to recent procedure. No acute osseous pathology. Review of the MIP images confirms the above findings. IMPRESSION: 1. No acute intrathoracic pathology. No CT evidence of pulmonary artery embolus. 2. Severe sigmoid and pancolonic  diverticulosis. 3. Underdistention of the colon versus less likely mild colitis. Clinical correlation is recommended. No bowel obstruction. 4. Fatty liver. Electronically Signed   By: Anner Crete M.D.   On: 04/02/2021 20:17   CT ABDOMEN PELVIS W CONTRAST  Result Date: 04/02/2021 CLINICAL DATA:  60 year old female with abdominal pain. Concern for acute pancreatitis. EXAM:  CT ANGIOGRAPHY CHEST CT ABDOMEN AND PELVIS WITH CONTRAST TECHNIQUE: Multidetector CT imaging of the chest was performed using the standard protocol during bolus administration of intravenous contrast. Multiplanar CT image reconstructions and MIPs were obtained to evaluate the vascular anatomy. Multidetector CT imaging of the abdomen and pelvis was performed using the standard protocol during bolus administration of intravenous contrast. CONTRAST:  19mL OMNIPAQUE IOHEXOL 350 MG/ML SOLN COMPARISON:  CT of the abdomen pelvis dated 12/23/2017. Chest radiograph dated 04/02/2021. FINDINGS: CTA CHEST FINDINGS Cardiovascular: There is mild cardiomegaly. No pericardial effusion. The thoracic aorta is unremarkable. The origins of the great vessels of the arch appear patent. Mild dilatation of the main pulmonary trunk suggestive of pulmonary hypertension. No pulmonary artery embolus identified. Mediastinum/Nodes: No hilar or mediastinal adenopathy. The esophagus and thyroid gland are grossly unremarkable. No mediastinal fluid collection. Lungs/Pleura: No focal consolidation, pleural effusion, or pneumothorax. The central airways are patent. Musculoskeletal: No chest wall abnormality. No acute or significant osseous findings. Review of the MIP images confirms the above findings. CT ABDOMEN and PELVIS FINDINGS No intra-abdominal free air. Trace free fluid in the pelvis. Hepatobiliary: Fatty infiltration of the liver. There is a 2 cm left hepatic lobe cyst. No intrahepatic biliary dilatation. Cholecystectomy. No retained calcified stone noted in the  central CBD. Pancreas: Unremarkable. No pancreatic ductal dilatation or surrounding inflammatory changes. Spleen: Normal in size without focal abnormality. Adrenals/Urinary Tract: The adrenal glands unremarkable. There is no hydronephrosis on either side. There is symmetric enhancement and excretion of contrast by both kidneys. The visualized ureters and urinary bladder appear unremarkable. Stomach/Bowel: There is severe sigmoid and pancolonic diverticulosis without active inflammatory changes. Diffuse thickened appearance of the colon, likely related to underdistention. Mild colitis is not excluded. Clinical correlation is recommended. There is no bowel obstruction. Appendectomy. Vascular/Lymphatic: The abdominal aorta and IVC are unremarkable. No portal venous gas. There is no adenopathy. Reproductive: Hysterectomy. No adnexal masses. Other: None Musculoskeletal: Osteopenia with degenerative changes of the spine. Epidural air in the central canal at L4-L5. There is a small fluid and surrounding subcutaneous stranding in the subcutaneous soft tissues of the lower back, likely related to recent procedure. No acute osseous pathology. Review of the MIP images confirms the above findings. IMPRESSION: 1. No acute intrathoracic pathology. No CT evidence of pulmonary artery embolus. 2. Severe sigmoid and pancolonic diverticulosis. 3. Underdistention of the colon versus less likely mild colitis. Clinical correlation is recommended. No bowel obstruction. 4. Fatty liver. Electronically Signed   By: Anner Crete M.D.   On: 04/02/2021 20:17   DG Chest Portable 1 View  Result Date: 04/02/2021 CLINICAL DATA:  Pt brought in by [facility] for c/o chest pain; pt had back surgery this am; pt took percocet, vicodin around 5pm; pt started vomiting and having difficulty breathing; pt has redness all over body; benadryl 50mg , albuterol 5mg  given by ems; pt has swelling to tongue; tachy and hypertensive with ems EXAM: PORTABLE CHEST 1  VIEW COMPARISON:  09/29/2006. FINDINGS: Cardiac silhouette is normal in size. No mediastinal or hilar masses. Clear lungs.  No convincing pleural effusion.  No pneumothorax. Skeletal structures are grossly intact. IMPRESSION: No active disease. Electronically Signed   By: Lajean Manes M.D.   On: 04/02/2021 18:31    Procedures Procedures   Medications Ordered in ED Medications  sodium chloride 0.9 % bolus 1,000 mL (0 mLs Intravenous Stopped 04/02/21 2008)  fentaNYL (SUBLIMAZE) injection 50 mcg (50 mcg Intravenous Given 04/02/21 1822)  iohexol (OMNIPAQUE) 350 MG/ML injection 100  mL (100 mLs Intravenous Contrast Given 04/02/21 1955)    ED Course  I have reviewed the triage vital signs and the nursing notes.  Pertinent labs & imaging results that were available during my care of the patient were reviewed by me and considered in my medical decision making (see chart for details).    MDM Rules/Calculators/A&P                          Patient's symptoms seem to resolve with fluids, fentanyl, and time.  She has been walking to the bathroom multiple times.  She appears dramatically better.  My suspicion that she had a true anaphylaxis/serious allergic reaction is low and I think this might of been a side effect/adverse reaction versus other nonspecific cause of chest pain.  Initial troponin is negative and ECG is without obvious ischemia.  Given she just had surgery a CTA was obtained and with a nonspecific lipase and some upper abdominal/lower chest pain and abdomen and pelvis was added on.  All of this is unremarkable.  Currently a troponin is pending for her second troponin.  However the patient is wanting to leave and does not want to stay at all to wait for this.  I discussed that while less likely, there could be an MI that we are missing.  She understands this.  We discussed return precautions. Final Clinical Impression(s) / ED Diagnoses Final diagnoses:  Nonspecific chest pain  Adverse drug  effect, initial encounter    Rx / DC Orders ED Discharge Orders     None        Sherwood Gambler, MD 04/02/21 2107

## 2021-04-02 NOTE — Discharge Instructions (Addendum)
If you develop recurrent, continued, or worsening chest pain, shortness of breath, fever, vomiting, abdominal or back pain, or any other new/concerning symptoms then return to the ER for evaluation.  

## 2021-07-14 ENCOUNTER — Other Ambulatory Visit: Payer: Self-pay | Admitting: Family Medicine

## 2021-07-29 ENCOUNTER — Other Ambulatory Visit: Payer: Self-pay | Admitting: Family Medicine

## 2021-07-29 DIAGNOSIS — E119 Type 2 diabetes mellitus without complications: Secondary | ICD-10-CM

## 2021-08-02 ENCOUNTER — Telehealth: Payer: Self-pay | Admitting: Family Medicine

## 2021-08-02 DIAGNOSIS — Z79899 Other long term (current) drug therapy: Secondary | ICD-10-CM

## 2021-08-02 DIAGNOSIS — E785 Hyperlipidemia, unspecified: Secondary | ICD-10-CM

## 2021-08-02 DIAGNOSIS — E1169 Type 2 diabetes mellitus with other specified complication: Secondary | ICD-10-CM

## 2021-08-02 DIAGNOSIS — E119 Type 2 diabetes mellitus without complications: Secondary | ICD-10-CM

## 2021-08-02 NOTE — Telephone Encounter (Signed)
Patient has appointment 11/7 and asked about labs for A1C. Please advise.  [phone]

## 2021-08-06 NOTE — Telephone Encounter (Signed)
Lipid, liver, metabolic 7, hemoglobin C1U

## 2021-08-07 NOTE — Telephone Encounter (Signed)
Pt returned call and verbalized understanding  

## 2021-08-07 NOTE — Telephone Encounter (Signed)
Blood work ordered in Epic. Left message to return call 

## 2021-08-15 LAB — HEPATIC FUNCTION PANEL
ALT: 29 IU/L (ref 0–32)
AST: 31 IU/L (ref 0–40)
Albumin: 4.7 g/dL (ref 3.8–4.9)
Alkaline Phosphatase: 72 IU/L (ref 44–121)
Bilirubin Total: 0.6 mg/dL (ref 0.0–1.2)
Bilirubin, Direct: 0.16 mg/dL (ref 0.00–0.40)
Total Protein: 6.8 g/dL (ref 6.0–8.5)

## 2021-08-15 LAB — BASIC METABOLIC PANEL WITH GFR
BUN/Creatinine Ratio: 15 (ref 9–23)
BUN: 12 mg/dL (ref 6–24)
CO2: 24 mmol/L (ref 20–29)
Calcium: 9.7 mg/dL (ref 8.7–10.2)
Chloride: 97 mmol/L (ref 96–106)
Creatinine, Ser: 0.78 mg/dL (ref 0.57–1.00)
Glucose: 201 mg/dL — ABNORMAL HIGH (ref 70–99)
Potassium: 4.4 mmol/L (ref 3.5–5.2)
Sodium: 137 mmol/L (ref 134–144)
eGFR: 87 mL/min/1.73 (ref >59)

## 2021-08-15 LAB — HEMOGLOBIN A1C
Est. average glucose Bld gHb Est-mCnc: 177 mg/dL
Hgb A1c MFr Bld: 7.8 % — ABNORMAL HIGH (ref 4.8–5.6)

## 2021-08-15 LAB — LIPID PANEL
Chol/HDL Ratio: 3.2 ratio (ref 0.0–4.4)
Cholesterol, Total: 187 mg/dL (ref 100–199)
HDL: 59 mg/dL (ref >39)
LDL Chol Calc (NIH): 104 mg/dL — ABNORMAL HIGH (ref 0–99)
Triglycerides: 140 mg/dL (ref 0–149)
VLDL Cholesterol Cal: 24 mg/dL (ref 5–40)

## 2021-08-17 ENCOUNTER — Ambulatory Visit: Payer: 59 | Admitting: Family Medicine

## 2021-08-19 ENCOUNTER — Other Ambulatory Visit: Payer: Self-pay | Admitting: Family Medicine

## 2021-08-20 ENCOUNTER — Encounter: Payer: Self-pay | Admitting: Family Medicine

## 2021-08-20 ENCOUNTER — Other Ambulatory Visit: Payer: Self-pay

## 2021-08-20 ENCOUNTER — Ambulatory Visit: Payer: 59 | Admitting: Family Medicine

## 2021-08-20 VITALS — BP 128/82 | Temp 97.2°F | Wt 183.4 lb

## 2021-08-20 DIAGNOSIS — Z79899 Other long term (current) drug therapy: Secondary | ICD-10-CM

## 2021-08-20 DIAGNOSIS — E1169 Type 2 diabetes mellitus with other specified complication: Secondary | ICD-10-CM

## 2021-08-20 DIAGNOSIS — E119 Type 2 diabetes mellitus without complications: Secondary | ICD-10-CM | POA: Diagnosis not present

## 2021-08-20 DIAGNOSIS — E785 Hyperlipidemia, unspecified: Secondary | ICD-10-CM

## 2021-08-20 DIAGNOSIS — Z23 Encounter for immunization: Secondary | ICD-10-CM | POA: Diagnosis not present

## 2021-08-20 MED ORDER — ROSUVASTATIN CALCIUM 20 MG PO TABS: 20.0000 mg | ORAL_TABLET | Freq: Every day | ORAL | 3 refills | Status: DC

## 2021-08-20 MED ORDER — METFORMIN HCL 500 MG PO TABS: ORAL_TABLET | ORAL | 1 refills | Status: DC

## 2021-08-20 MED ORDER — CITALOPRAM HYDROBROMIDE 40 MG PO TABS: ORAL_TABLET | ORAL | 1 refills | Status: DC

## 2021-08-20 MED ORDER — ESZOPICLONE 3 MG PO TABS: ORAL_TABLET | ORAL | 5 refills | Status: DC

## 2021-08-20 MED ORDER — TRULICITY 4.5 MG/0.5ML ~~LOC~~ SOAJ: 4.5000 mg | SUBCUTANEOUS | 6 refills | Status: DC

## 2021-08-20 NOTE — Progress Notes (Signed)
   Subjective:    Patient ID: Molly Ryan, female    DOB: 1960-12-11, 60 y.o.   MRN: [id number]  HPI Pt here for follow up on DM. Pt not regularly checking sugars but states she has had no issues. Taking all meds as directed. Doing well on Celexa 20 mg. Patient has underlying diabetes Morbid obesity Trying to watch her diet Under a lot of stress at work and at home Finds her self at times anxious and depressed Does not want counseling She is trying to watch her diet to some degree Very difficult for her to fit in exercise because of her overall issues going on at work and home  Review of Systems     Objective:   Physical Exam General-in no acute distress Eyes-no discharge Lungs-respiratory rate normal, CTA CV-no murmurs,RRR Extremities skin warm dry no edema Neuro grossly normal Behavior normal, alert  Diabetic foot exam normal      Assessment & Plan:  I encourage patient to get eye exam and send [place] updates Also encourage her to get colonoscopy Also encourage her to get mammogram  1. Type 2 diabetes mellitus without complication, without long-term current use of insulin ([facility]) Diabetes healthy diet continue current medication GLP-1 we will go ahead and increase the dose-this was discussed await the findings of her labs on follow-up.  If not improving she will need to switch - Pneumococcal conjugate vaccine 20-valent (Prevnar 20) - Hemoglobin B5D - Basic Metabolic Panel (BMET) - Lipid Profile  2. Hyperlipidemia associated with type 2 diabetes mellitus ([place]) Changed to Crestor do labs in 3 months hopefully LDL will be below 70 at that point - Pneumococcal conjugate vaccine 20-valent (Prevnar 20) - Hemoglobin H7C - Basic Metabolic Panel (BMET) - Lipid Profile  3. Need for vaccination Pneumococcal vaccine - Pneumococcal conjugate vaccine 20-valent (Prevnar 20)  4. High risk medication use Labs ordered - Pneumococcal conjugate vaccine 20-valent (Prevnar 20) -  Hemoglobin B6L - Basic Metabolic Panel (BMET) - Lipid Profile Morbid obesity dietary measures discussed healthy eating increase Trulicity to maximum dose 4.5 patient to give [place] feedback on how that is going If side effects to let us know

## 2021-09-03 ENCOUNTER — Telehealth: Payer: Self-pay | Admitting: Family Medicine

## 2021-09-03 ENCOUNTER — Other Ambulatory Visit: Payer: Self-pay

## 2021-09-03 MED ORDER — MECLIZINE HCL 25 MG PO TABS: ORAL_TABLET | ORAL | 2 refills | Status: DC

## 2021-09-03 NOTE — Telephone Encounter (Signed)
Pt requesting refill on Meclizine 25 mg tablet. Take one tablet q 6 hrs prn dizziness. Pt states she is having vertigo this morning. Pt last seen 08/20/21 for Hyperlipidemia. Please advise. Thank you  [facility]  (Advised pt that if any questions/concerns we would call otherwise check with pharmacy later today)

## 2021-09-03 NOTE — Telephone Encounter (Signed)
Tried calling patient to inform her of her medication refill being sent to the pharmacy

## 2021-09-03 NOTE — Telephone Encounter (Signed)
2 refills plz

## 2021-09-04 NOTE — Telephone Encounter (Signed)
Spoke with patient about medication refill for meclizine,she believes she may have a sinus infection, she has green mucous and sinus headaches. Spoke with patient about a possible virtual appt w/ [facility] or UC appt .

## 2021-09-04 NOTE — Telephone Encounter (Signed)
May offer 4:10 PM appointment if she desires

## 2021-09-05 NOTE — Telephone Encounter (Signed)
Patient stated thank you but she can not make that appt today. Advised patient to check with urgent care or e visit thru my chart if no better or worse. Patient verbalized understanding.

## 2021-10-12 ENCOUNTER — Other Ambulatory Visit: Payer: Self-pay | Admitting: Family Medicine

## 2021-10-17 ENCOUNTER — Telehealth: Payer: Self-pay | Admitting: *Deleted

## 2021-10-17 NOTE — Telephone Encounter (Signed)
Na

## 2021-10-17 NOTE — Telephone Encounter (Signed)
Patient states that pharmacy has been unable to get trulicity for over 2 weeks and told her it on national back order. Patient states she has not had shot in 2 weeks but is still taking Metformin and didn't know if there was anything else she needed to do.

## 2021-10-17 NOTE — Telephone Encounter (Signed)
Hard to know how long this could last for This is occurring with other meds as well (ozempic, Mounjaro) Periodically monitor glucose readings and send me readings so if rises too much may need to add other meds

## 2021-10-17 NOTE — Telephone Encounter (Signed)
Patient notified and verbalized understanding. Patient stated the only thing she has noticed was increase in appetite since stopping med but is being mindful of what she eats

## 2021-11-06 ENCOUNTER — Telehealth: Payer: Self-pay | Admitting: *Deleted

## 2021-11-06 NOTE — Telephone Encounter (Signed)
Patient given Dr. Bary Leriche message. Verbalized understanding. Patient stated that she would contact her insurance tomorrow and would call back to schedule appt.

## 2021-11-06 NOTE — Telephone Encounter (Signed)
Unfortunately there are no easy answers She can call around other pharmacies to see if Trulicity is available at other pharmacies There are similar medications to Trulicity These include Ozempic, Victoza,Mounjaro She would have to know if her insurance company would cover these [employer] company could tell her.  Unfortunately our system would not be able to tell us. Secondly she would have to call around to see if these medications are available If she is having no luck finding these medications either covered by her insurance or available then the next step would be is to switch medications to something totally different I would recommend office consultation or at the very least a phone/video consultation to discuss alternatives It would be helpful to know what her glucose readings/update are if she can send [place] a MyChart reading regarding that

## 2021-11-09 ENCOUNTER — Telehealth: Payer: Self-pay | Admitting: Family Medicine

## 2021-11-09 NOTE — Telephone Encounter (Signed)
Patient got approval for Ozempic injection through her insurance. Injection doses 5, 1, 2 for type 2 diabetes

## 2021-11-11 NOTE — Telephone Encounter (Signed)
May send in [place], may send in 1 month supply with 1 refill Ozempic is 0.25 weekly for the first 4 weeks then after that 0.5 mg weekly Follow-up in February as planned

## 2021-11-12 MED ORDER — OZEMPIC (0.25 OR 0.5 MG/DOSE) 2 MG/1.5ML ~~LOC~~ SOPN: 0.2500 mg | PEN_INJECTOR | SUBCUTANEOUS | 0 refills | Status: DC

## 2021-11-12 NOTE — Telephone Encounter (Signed)
Patient notified and verbalized understanding. 

## 2021-11-12 NOTE — Telephone Encounter (Signed)
Prescription sent electronically to pharmacy. Telephone call- voicemail is full

## 2021-11-12 NOTE — Addendum Note (Signed)
Addended by: Dairl Ponder on: 11/12/2021 10:07 AM   Modules accepted: Orders

## 2021-11-21 ENCOUNTER — Telehealth: Payer: Self-pay | Admitting: Family Medicine

## 2021-11-21 ENCOUNTER — Other Ambulatory Visit: Payer: Self-pay | Admitting: Family Medicine

## 2021-11-21 MED ORDER — ESZOPICLONE 3 MG PO TABS: ORAL_TABLET | ORAL | 0 refills | Status: DC

## 2021-11-21 NOTE — Telephone Encounter (Signed)
Patient wants all medications moved to [facility] [address] and she needs prescription for her sleep medication she couldn't remember name or mg.

## 2021-11-21 NOTE — Telephone Encounter (Signed)
Patient informed medication sent to pharmacy and she should be able to move the others on her own. Verbalized understanding

## 2021-11-21 NOTE — Telephone Encounter (Signed)
Refill of this medicine was sent to [facility] [address] she should be able to get all the other prescriptions moved over on her own she has a follow-up visit with me in February

## 2021-11-21 NOTE — Telephone Encounter (Signed)
Pt taking Eszopiclone 3 mg for sleep. Please advise. Thank you

## 2021-11-24 LAB — LIPID PANEL
Chol/HDL Ratio: 1.9 ratio (ref 0.0–4.4)
Cholesterol, Total: 115 mg/dL (ref 100–199)
HDL: 59 mg/dL (ref >39)
LDL Chol Calc (NIH): 39 mg/dL (ref 0–99)
Triglycerides: 86 mg/dL (ref 0–149)
VLDL Cholesterol Cal: 17 mg/dL (ref 5–40)

## 2021-11-24 LAB — HEMOGLOBIN A1C
Est. average glucose Bld gHb Est-mCnc: 217 mg/dL
Hgb A1c MFr Bld: 9.2 % — ABNORMAL HIGH (ref 4.8–5.6)

## 2021-11-24 LAB — BASIC METABOLIC PANEL
BUN/Creatinine Ratio: 14 (ref 9–23)
BUN: 10 mg/dL (ref 6–24)
CO2: 23 mmol/L (ref 20–29)
Calcium: 9.4 mg/dL (ref 8.7–10.2)
Chloride: 101 mmol/L (ref 96–106)
Creatinine, Ser: 0.74 mg/dL (ref 0.57–1.00)
Glucose: 251 mg/dL — ABNORMAL HIGH (ref 70–99)
Potassium: 4 mmol/L (ref 3.5–5.2)
Sodium: 138 mmol/L (ref 134–144)
eGFR: 93 mL/min/{1.73_m2} (ref >59)

## 2021-11-30 ENCOUNTER — Other Ambulatory Visit: Payer: Self-pay

## 2021-11-30 ENCOUNTER — Ambulatory Visit: Payer: 59 | Admitting: Family Medicine

## 2021-11-30 VITALS — BP 120/72 | HR 67 | Temp 99.1°F | Ht 60.0 in | Wt 182.2 lb

## 2021-11-30 DIAGNOSIS — Z79899 Other long term (current) drug therapy: Secondary | ICD-10-CM | POA: Diagnosis not present

## 2021-11-30 DIAGNOSIS — E785 Hyperlipidemia, unspecified: Secondary | ICD-10-CM | POA: Diagnosis not present

## 2021-11-30 DIAGNOSIS — E1169 Type 2 diabetes mellitus with other specified complication: Secondary | ICD-10-CM

## 2021-11-30 DIAGNOSIS — E119 Type 2 diabetes mellitus without complications: Secondary | ICD-10-CM | POA: Diagnosis not present

## 2021-11-30 MED ORDER — RYBELSUS 3 MG PO TABS: 3.0000 mg | ORAL_TABLET | Freq: Every day | ORAL | 0 refills | Status: DC

## 2021-11-30 MED ORDER — ESZOPICLONE 3 MG PO TABS: ORAL_TABLET | ORAL | 4 refills | Status: DC

## 2021-11-30 NOTE — Patient Instructions (Signed)

## 2021-11-30 NOTE — Progress Notes (Signed)
If the  Subjective:    Patient ID: Molly Ryan, female    DOB: 10/18/1960, 61 y.o.   MRN: [id number]  HPI  Patient here for follow up on diabetes. Patient says her A1C is up. Patient here today for follow-up Unfortunately her diabetes not under good control What is complicated this is the lack of Ozempic available She finds herself stressed about this Is trying to do the best she can dietary wise She does take her cholesterol medicine regular basis tries to eat healthy She is consistent with her metformin Consistent with the citalopram moods are doing good Review of Systems     Objective:   Physical Exam  General-in no acute distress Eyes-no discharge Lungs-respiratory rate normal, CTA CV-no murmurs,RRR Extremities skin warm dry no edema Neuro grossly normal Behavior normal, alert       Assessment & Plan:  1. Type 2 diabetes mellitus without complication, without long-term current use of insulin (HCC) Rybelsus 3 mg daily.  Will need to bump up dose if doing well with medicine going to this medicine because Ozempic and Trulicity are not available- - Urine Microalbumin w/creat. ratio - Hemoglobin Y1P - Basic Metabolic Panel (BMET) - Lipid Profile - Hepatic function panel  2. Hyperlipidemia associated with type 2 diabetes mellitus (HCC) Continue statin healthy diet - Urine Microalbumin w/creat. ratio - Hemoglobin J0D - Basic Metabolic Panel (BMET) - Lipid Profile - Hepatic function panel  3. High risk medication use Check labs - Urine Microalbumin w/creat. ratio - Hemoglobin T2I - Basic Metabolic Panel (BMET) - Lipid Profile - Hepatic function panel

## 2021-12-03 ENCOUNTER — Telehealth: Payer: Self-pay | Admitting: Family Medicine

## 2021-12-03 MED ORDER — RYBELSUS 3 MG PO TABS: 3.0000 mg | ORAL_TABLET | Freq: Every day | ORAL | 2 refills | Status: DC

## 2021-12-03 NOTE — Telephone Encounter (Signed)
Patient is requesting refill on Rybelsus 3 mg-[facility]

## 2021-12-03 NOTE — Telephone Encounter (Signed)
May refill for 3 months If she does well on this dose she needs to let us know and we will increase the dose to 7 mg Typically most individuals will increase the dose to 7 mg after 1 month We will be up to the patient to give [place] updates regarding this thank you

## 2021-12-03 NOTE — Telephone Encounter (Signed)
Unable to leave message due to mailbox being full.

## 2021-12-03 NOTE — Telephone Encounter (Signed)
RX sent to [facility] on [address]

## 2021-12-07 NOTE — Telephone Encounter (Signed)
Telephone call -voicemail full

## 2021-12-11 ENCOUNTER — Ambulatory Visit: Payer: Self-pay

## 2021-12-11 ENCOUNTER — Other Ambulatory Visit: Payer: Self-pay | Admitting: Family Medicine

## 2021-12-11 ENCOUNTER — Other Ambulatory Visit: Payer: Self-pay

## 2021-12-11 DIAGNOSIS — M25561 Pain in right knee: Secondary | ICD-10-CM

## 2021-12-11 DIAGNOSIS — M545 Low back pain, unspecified: Secondary | ICD-10-CM

## 2021-12-11 IMAGING — DX DG LUMBAR SPINE COMPLETE 4+V
5 series · 5 of 5 positions shown · non-contrast
Comparison: CT abdomen and pelvis [DATE]

CLINICAL DATA: Twisted back and heard a pop, back pain

EXAM:
LUMBAR SPINE - COMPLETE 4+ VIEW

[l-spine ap]
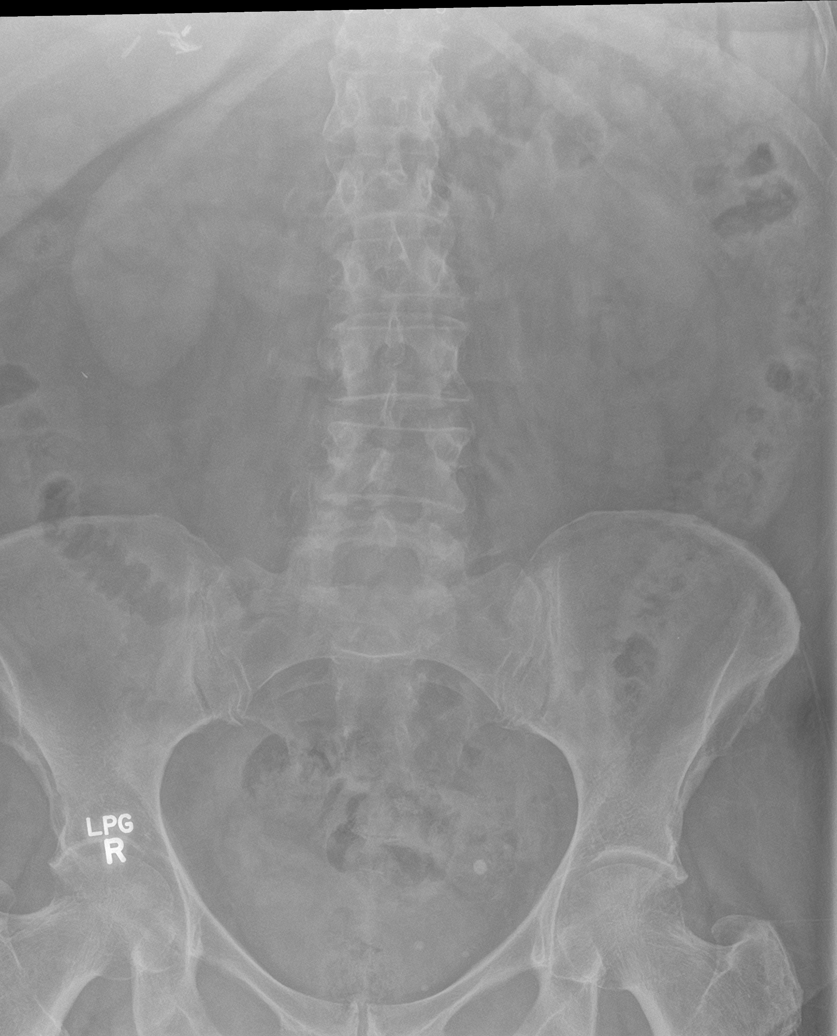

[l-spine obl (1 of 2)]
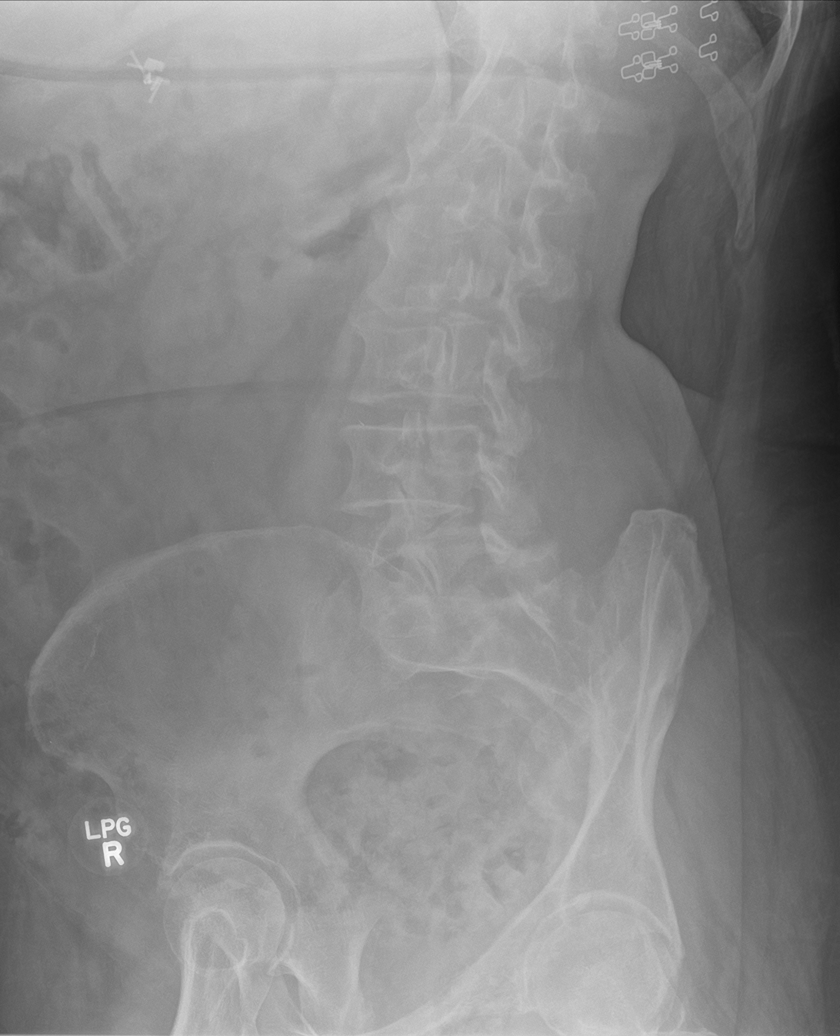

[l-spine obl (2 of 2)]
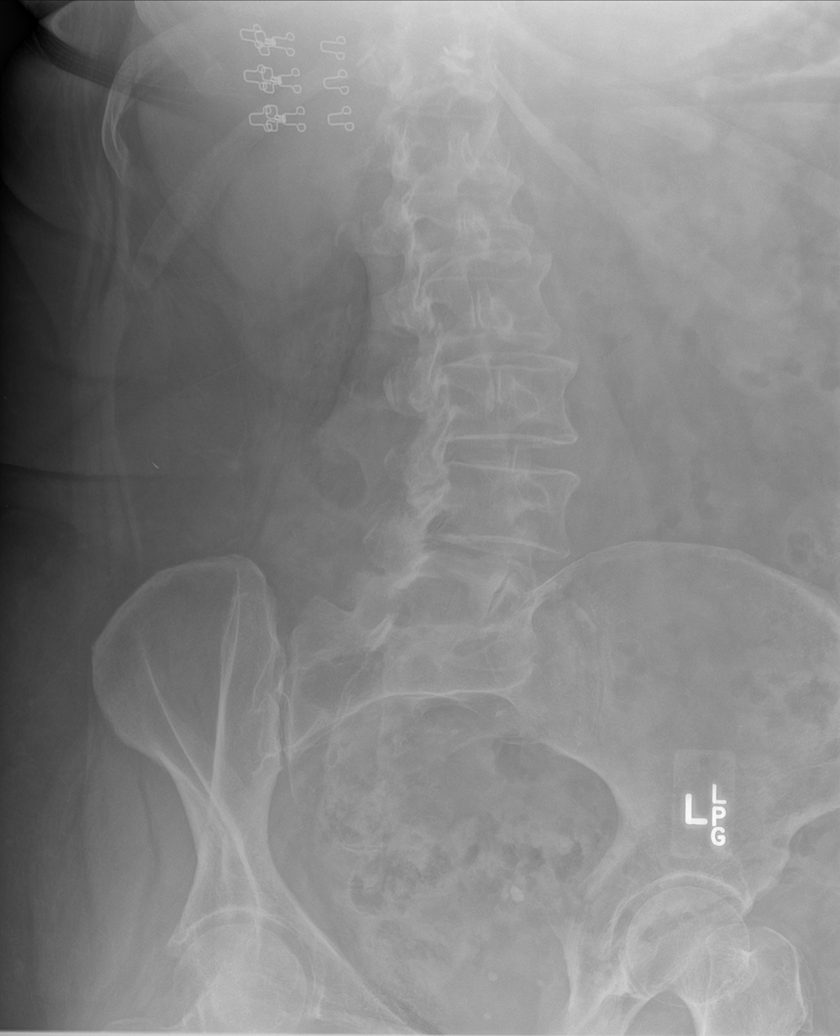

[l-spine lateral]
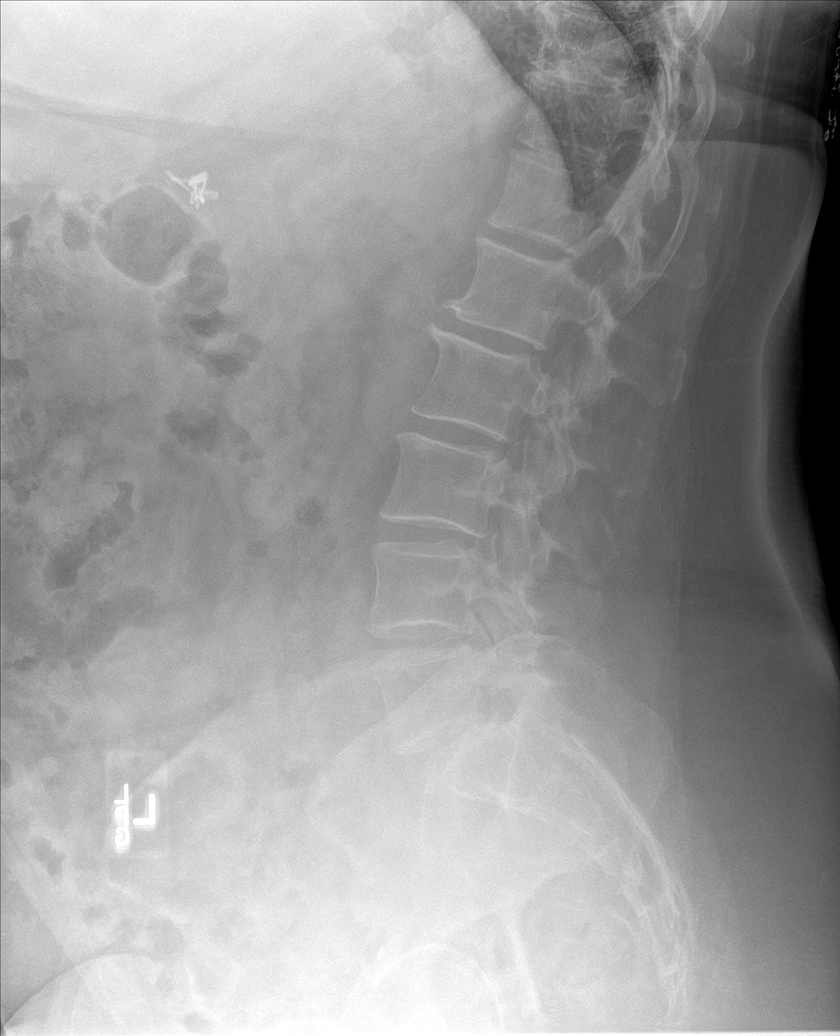

[l-spine spot]
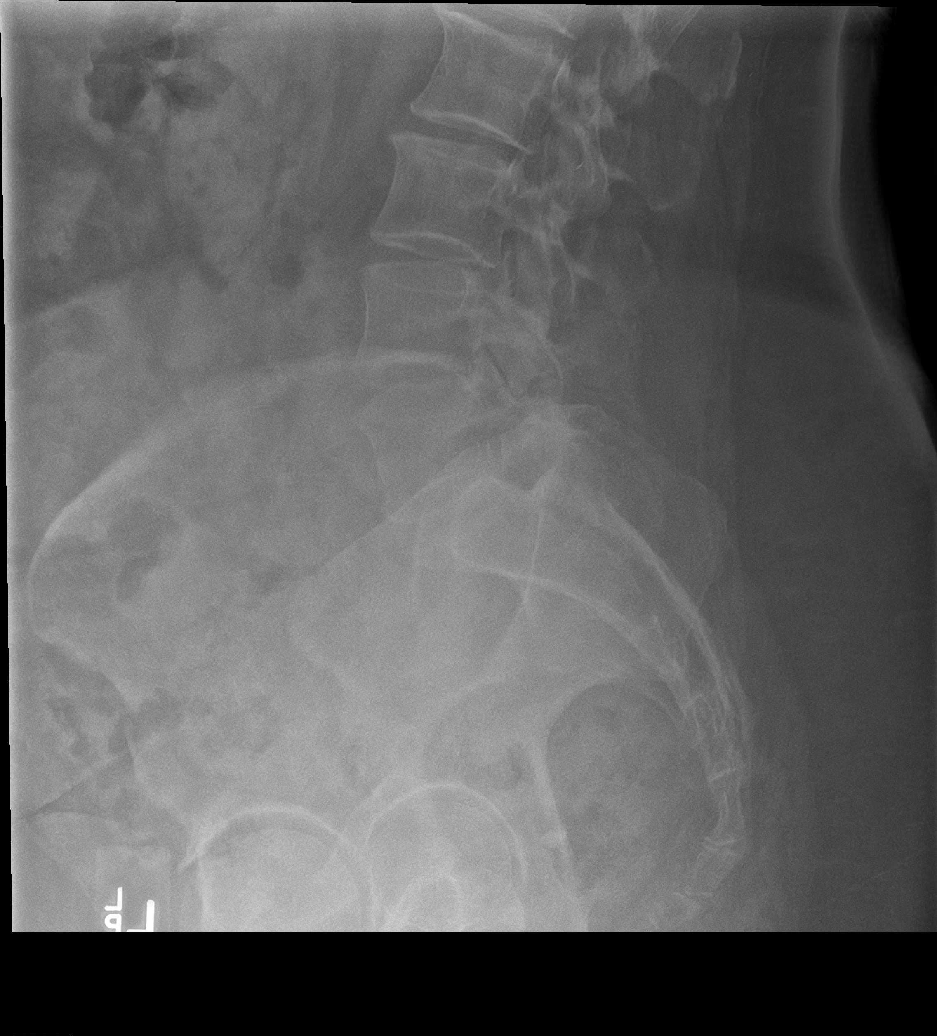

[5 of 5 positions shown; findings below may reference images not displayed]

FINDINGS: Hypoplastic last ribs.

Five non-rib-bearing lumbar vertebra.

Vertebral body and disc space heights maintained with minimal
levoconvex lumbar scoliosis.

No fracture, subluxation, or bone destruction.

No spondylolysis.

Small anterior endplate spurs at L1-L2.

SI joints preserved.
IMPRESSION: No acute osseous abnormalities.

## 2021-12-11 NOTE — Telephone Encounter (Signed)
Telephone call- voicemail full

## 2021-12-19 ENCOUNTER — Encounter: Payer: Self-pay | Admitting: *Deleted

## 2021-12-19 NOTE — Telephone Encounter (Signed)
Unable to reach patient by phone. Patient notified via My Chart. ?

## 2021-12-20 MED ORDER — RYBELSUS 7 MG PO TABS
7.0000 mg | ORAL_TABLET | Freq: Every day | ORAL | 1 refills | Status: DC
Start: 2021-12-20 — End: 2022-03-07

## 2021-12-20 NOTE — Telephone Encounter (Signed)
Rybelsus is a stepwise medication ?At this point I would recommend 7 mg 1 daily ?#30 with 1 refill ?Patient to give [place] update in approximately 3 weeks ?Based around how she is tolerating it plus also based upon her numbers may go up to the next step which is 14 mg at that point ? ?

## 2022-01-08 ENCOUNTER — Other Ambulatory Visit: Payer: Self-pay

## 2022-01-08 ENCOUNTER — Ambulatory Visit (INDEPENDENT_AMBULATORY_CARE_PROVIDER_SITE_OTHER): Payer: Worker's Compensation | Admitting: Orthopaedic Surgery

## 2022-01-08 DIAGNOSIS — M5441 Lumbago with sciatica, right side: Secondary | ICD-10-CM | POA: Diagnosis not present

## 2022-01-08 NOTE — Progress Notes (Signed)
The patient is a 61 year old female referred to me from [employer] due to an acute back injury.  She injured her lumbar spine on 12/05/2021 when the patient fell onto her and she hit a metal water fountain on the right side of her low back.  She has had sciatic symptoms since then with burning down into her right sciatic area and the back of her thigh.  However, she does have an established spine surgeon with Dr. Sherley Bounds with neurosurgery.  He actually operated on herniated disc less than a year ago in her lumbar spine which was quite symptomatic.  However, Worker's Comp. sent her our way.  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  Her blood glucose has been running high due to a lack of medications and backorder of medications.  Her last hemoglobin A1c a month ago was 9.2. ? ?She prefers to stand because of discomfort in the lower back.  She has pain with flexion extension of the lumbar spine that radiates to the right side into the sciatic region.  She does have a positive straight leg raise on the right side but no weakness in her leg. ? ?Plain films on the canopy system of her lumbar spine show just a slight degenerative scoliosis but no acute findings. ? ?She actually needs a referral to her spine surgeon given the acute nature of her new injury and the fact that she has had this surgery at the lower lumbar spine within the last year.  It is absolutely medically appropriate that she be evaluated for this new injury by her own spine surgeon.  We will make that referral. ?

## 2022-01-18 ENCOUNTER — Encounter: Payer: Self-pay | Admitting: Family Medicine

## 2022-01-19 ENCOUNTER — Other Ambulatory Visit: Payer: Self-pay | Admitting: Family Medicine

## 2022-01-21 NOTE — Telephone Encounter (Signed)
Spoke with patient- patient states her new medication Rybelsus is giving her bad acid reflux and when she woke up Friday her vision was blurry- can only see clear out of bi focals- call nurses line and they told her iot could be a side effect from Rybelsus so she stopped it and it has gotten some better but still blurry and didn't know if she needs to see eye doctor and if so a special eye doctor or regular eye doctor- also no longer taking Rybelsus  ?

## 2022-01-21 NOTE — Telephone Encounter (Signed)
Patient called and stated she is unable to get off work today due to short staffed at work and she made appt with eye doctor and they told her they would be running a lot of tests for Thursday and seeing Neurologist tomorrow so she is unable to come in this week- she not sure what her sugars been running since stopping med because her vision is blurry and she cant see the machine but vision was went blurry while taking the Rybelsus and sugars were fine- not sure if you want to start something else or wait till she sees specialist ?

## 2022-01-21 NOTE — Telephone Encounter (Signed)
All of this hinges on what the eye doctor as well as neurologist is saying.  The patient truly needs to check her sugars.  Hopefully there is somebody at home who could help her check her sugar and read the machine for her?  I would hope she would be able to guide someone through checking her sugar.  If not we are more than willing to see her this week to check her and check her sugars.  It is very difficult to start a new medicine without knowing what the sugars are doing currently. ?

## 2022-01-21 NOTE — Telephone Encounter (Signed)
I recommend office visit to assess as well as check her glucose.  If glucose is sky high this could be causing her blurred vision.  But there could be neurologic issues or ophthalmologic issues therefore office visit this week.  Please set up ?

## 2022-01-23 ENCOUNTER — Telehealth: Payer: Self-pay | Admitting: Family Medicine

## 2022-01-23 MED ORDER — MECLIZINE HCL 25 MG PO TABS: ORAL_TABLET | ORAL | 2 refills | Status: DC

## 2022-01-23 NOTE — Telephone Encounter (Signed)
Refills sent to pharmacy. 

## 2022-01-23 NOTE — Addendum Note (Signed)
Addended by: Vicente Males on: 01/23/2022 10:38 AM ? ? Modules accepted: Orders ? ?

## 2022-01-23 NOTE — Telephone Encounter (Signed)
She may have 2 refills ?

## 2022-01-23 NOTE — Telephone Encounter (Signed)
[facility] Dr requesting refill on Meclizine 25 mg tablets. Take one tablet po every 6 hrs prn dizziness. Pt last seen 11/30/21 for DM. Please advise. Thank you  ?

## 2022-01-24 LAB — HM DIABETES EYE EXAM

## 2022-02-18 ENCOUNTER — Other Ambulatory Visit: Payer: Self-pay | Admitting: Family Medicine

## 2022-02-21 ENCOUNTER — Ambulatory Visit: Payer: 59 | Admitting: Family Medicine

## 2022-02-27 ENCOUNTER — Other Ambulatory Visit (HOSPITAL_BASED_OUTPATIENT_CLINIC_OR_DEPARTMENT_OTHER): Payer: Self-pay | Admitting: Neurological Surgery

## 2022-02-27 DIAGNOSIS — M5441 Lumbago with sciatica, right side: Secondary | ICD-10-CM

## 2022-03-01 ENCOUNTER — Ambulatory Visit (HOSPITAL_BASED_OUTPATIENT_CLINIC_OR_DEPARTMENT_OTHER)
Admission: RE | Admit: 2022-03-01 | Discharge: 2022-03-01 | Disposition: A | Discharge status: Home / Self Care | Payer: Worker's Compensation | Source: Ambulatory Visit | Attending: Neurological Surgery | Admitting: Neurological Surgery

## 2022-03-01 DIAGNOSIS — M5441 Lumbago with sciatica, right side: Secondary | ICD-10-CM | POA: Diagnosis not present

## 2022-03-01 DIAGNOSIS — M545 Low back pain, unspecified: Secondary | ICD-10-CM | POA: Diagnosis present

## 2022-03-01 DIAGNOSIS — M47816 Spondylosis without myelopathy or radiculopathy, lumbar region: Secondary | ICD-10-CM | POA: Insufficient documentation

## 2022-03-01 IMAGING — MR MR LUMBAR SPINE W/O CM
4 of 5 series · 26 of 48 positions shown · non-contrast
Comparison: [DATE]

CLINICAL DATA: Pain in the lower back and right leg, pt was knocked
into a metal water fountain in [REDACTED], has had prior microdiscectomy on
L5-S1

EXAM:
MRI LUMBAR SPINE WITHOUT CONTRAST
TECHNIQUE: Multiplanar, multisequence MR imaging of the lumbar spine was
performed. No intravenous contrast was administered.

[Series 2: T2 · sagittal · 4.0mm · 0.81mm/px · 6 of 15 slices shown (1 of 2)]
[im 1/15]
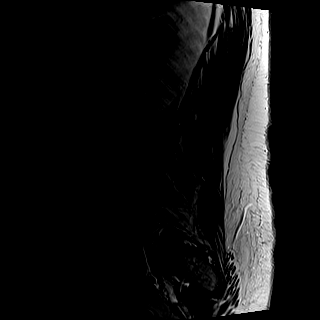
[im 3/15]
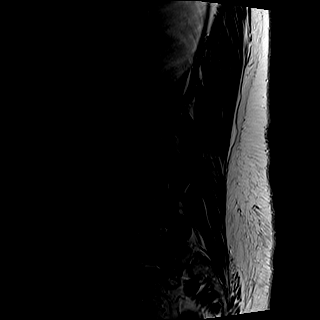
[im 6/15]
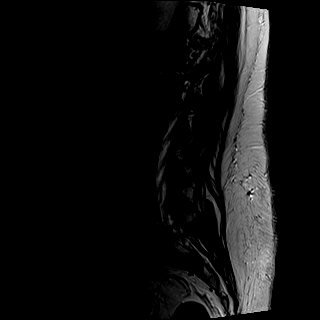
[im 9/15]
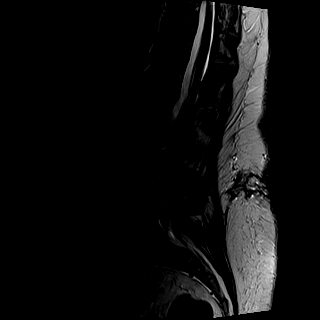
[im 12/15]
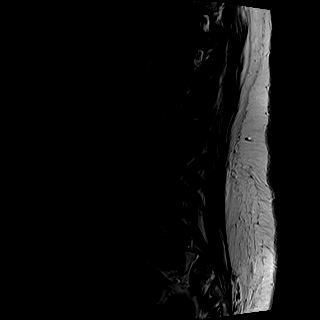
[im 15/15]
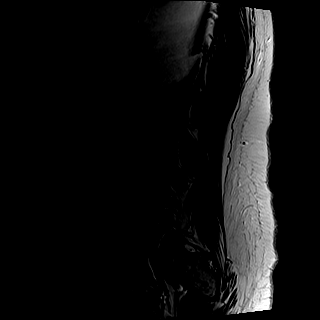

[Series 3: T1 · sagittal · 4.0mm · 0.41mm/px · 6 of 15 slices shown (1 of 2)]
[im 1/15]
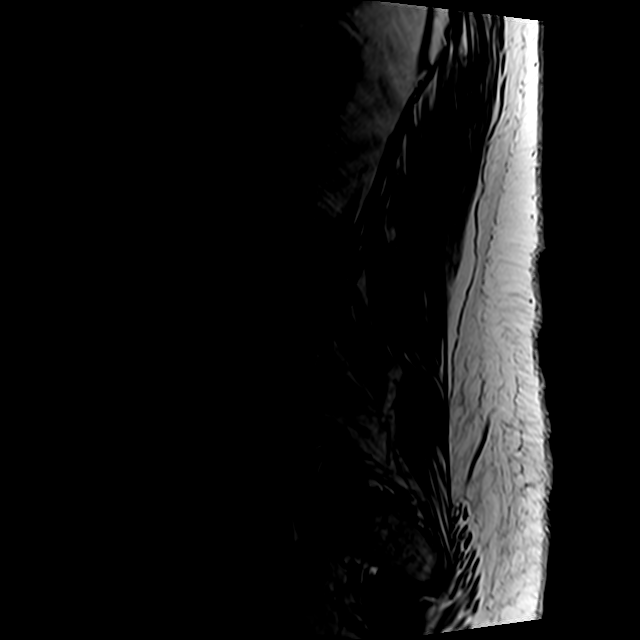
[im 3/15]
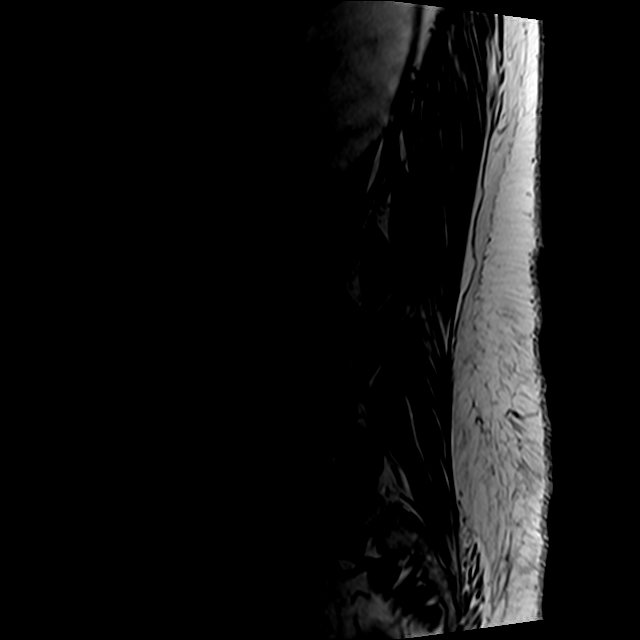
[im 6/15]
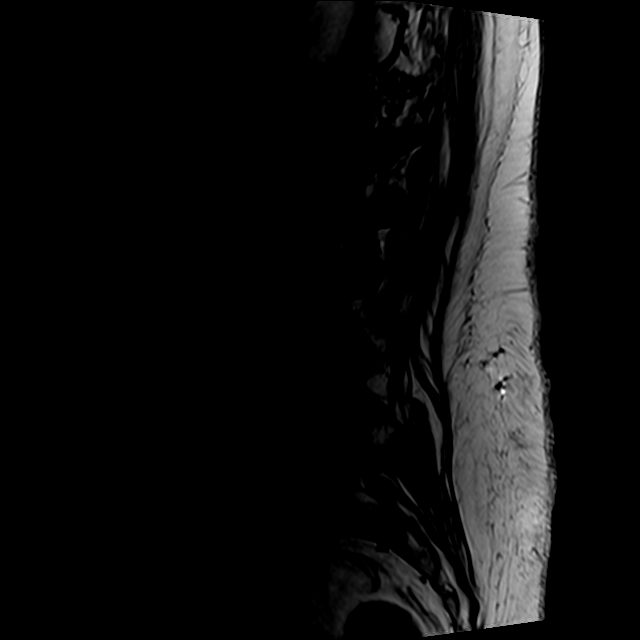
[im 9/15]
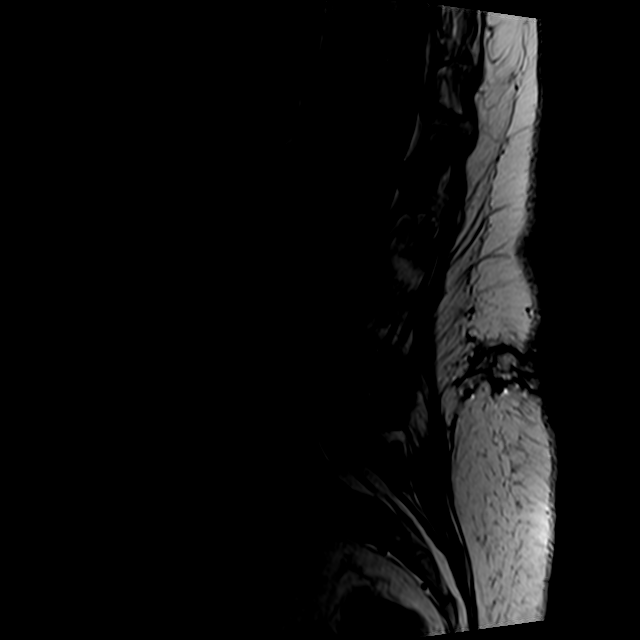
[im 12/15]
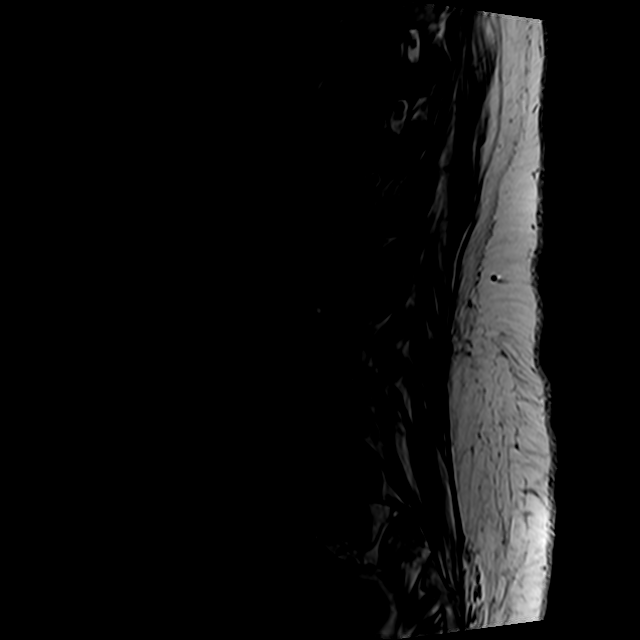
[im 15/15]
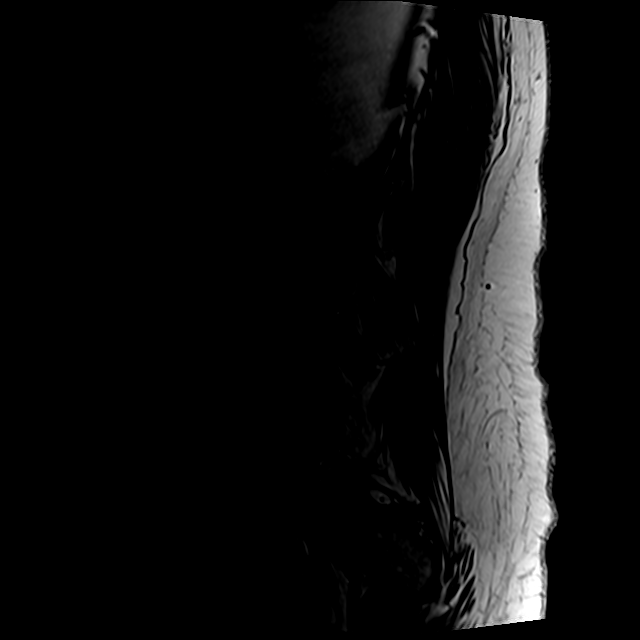

[Series 5: T2 · axial · 4.0mm · 0.78mm/px · z∈[-102,+117]mm · 9 of 39 slices shown (2 of 2)]
[im 1/39]
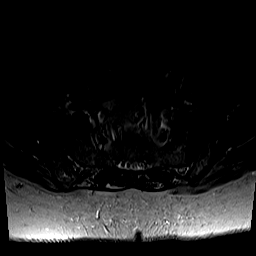
[im 6/39]
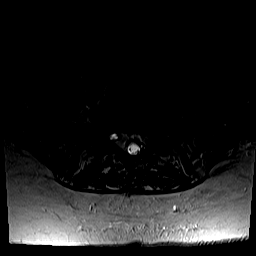
[im 11/39]
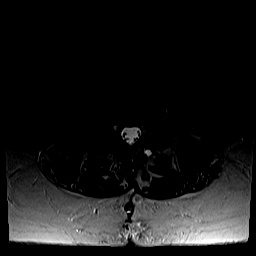
[im 17/39]
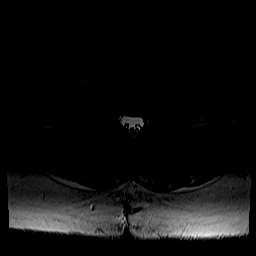
[im 20/39]
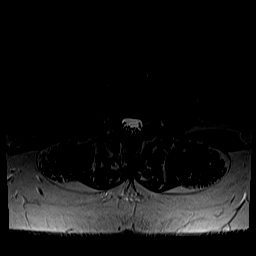
[im 22/39]
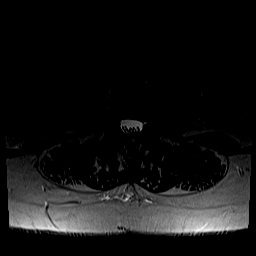
[im 28/39]
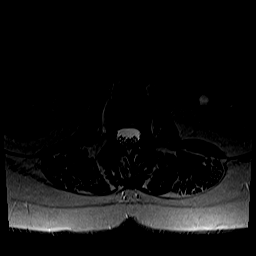
[im 33/39]
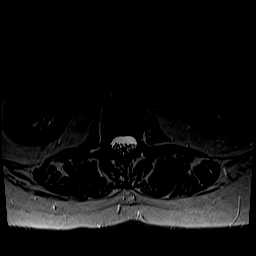
[im 39/39]
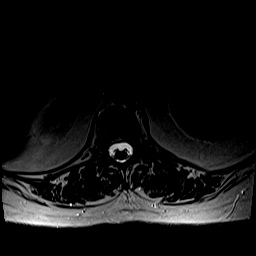

[Series 6: T1 · axial · 4.0mm · 0.39mm/px · z∈[-102,+88]mm · 5 of 39 slices shown (2 of 2)]
[im 1/39]
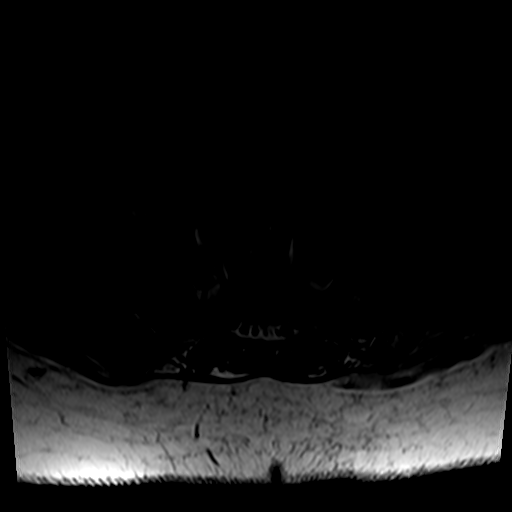
[im 6/39]
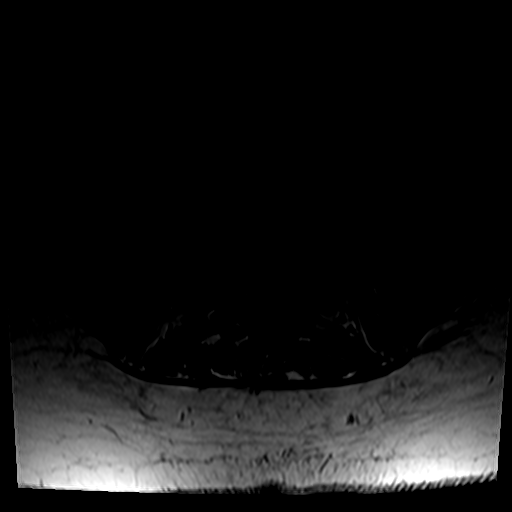
[im 11/39]
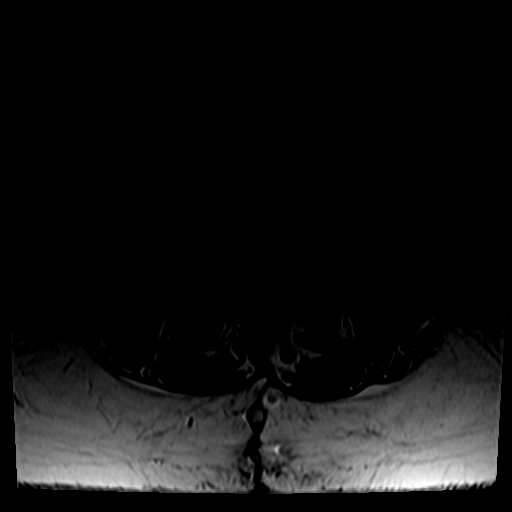
[im 20/39]
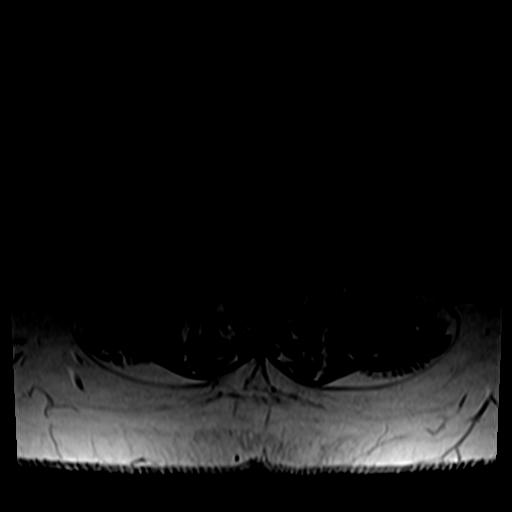
[im 33/39]
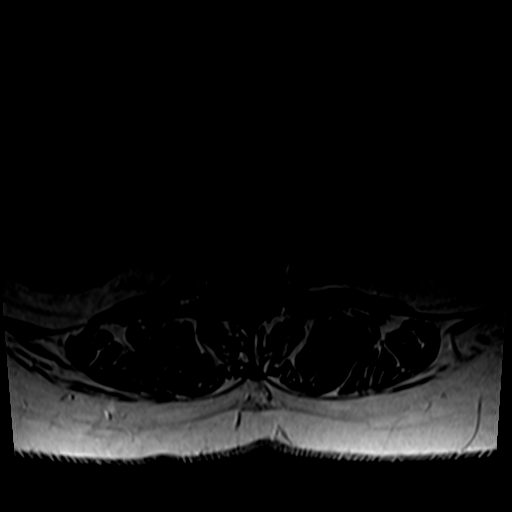

[26 of 48 positions shown; findings below may reference images not displayed]

FINDINGS: Segmentation:  Standard.

Alignment:  Physiologic.

Vertebrae: No acute fracture, evidence of discitis, or aggressive
bone lesion.

Conus medullaris and cauda equina: Conus extends to the L1 level.
Conus and cauda equina appear normal.

Paraspinal and other soft tissues: No acute paraspinal abnormality.

Disc levels:

Disc spaces: Disc desiccation throughout lumbar spine.

T12-L1: No significant disc bulge. No neural foraminal stenosis. No
central canal stenosis.

L1-L2: Minimal broad-based disc bulge. No foraminal or central canal
stenosis.

L2-L3: Mild broad-based disc bulge. No foraminal or central canal
stenosis.

L3-L4: Mild broad-based disc bulge. No foraminal or central canal
stenosis.

L4-L5: Mild broad-based disc bulge. No foraminal or central canal
stenosis. Mild right facet arthropathy.

L5-S1: Mild broad-based disc bulge. Left laminectomy. Intermediate
signal material along the left paracentral aspect likely reflecting
mild scarring along the left L5 and S1 nerve roots. Mild bilateral
facet arthropathy.
IMPRESSION: 1. Lumbar spine spondylosis as described above. No findings to
explain the patient's right leg pain.
2.  No acute osseous injury of the lumbar spine.

## 2022-03-07 ENCOUNTER — Ambulatory Visit: Payer: 59 | Admitting: Family Medicine

## 2022-03-07 DIAGNOSIS — E119 Type 2 diabetes mellitus without complications: Secondary | ICD-10-CM | POA: Diagnosis not present

## 2022-03-07 MED ORDER — GLIPIZIDE ER 5 MG PO TB24: ORAL_TABLET | ORAL | 1 refills | Status: DC

## 2022-03-07 NOTE — Assessment & Plan Note (Signed)
Uncontrolled.  Worsening.  Continue metformin.  Starting glipizide.  Labs today.

## 2022-03-07 NOTE — Patient Instructions (Addendum)
Labs today.   We will call with the results.  Continue the Metformin.  Glipizide as prescribed.  Follow up in the next 1-2 weeks.

## 2022-03-07 NOTE — Progress Notes (Signed)
Subjective:  Patient ID: Molly Ryan, female    DOB: May 19, 1961  Age: 61 y.o. MRN: [id number]  CC: Chief Complaint  Patient presents with   Diabetes    BS was over 300 this morning.     HPI:  61 year old female with a history of migraine and uncontrolled type 2 diabetes presents with complaints of elevated blood sugars.  Patient reports that her blood sugar this morning was over 360.  She has been on metformin 1000 mg twice daily and Rybelsus.  Rybelsus recently discontinued after she suffered vision loss.  She saw ophthalmology and this was thought to be from [place].  Patient states that she does not feel well.  She is concerned about her rising blood glucose levels.  Patient states that she has been on Trulicity as well as Jardiance in the past.  She prefers not to start insulin therapy.  She is in need of repeat A1c.  She would like to discuss the next best course of action to get her blood sugars under better control.  Patient Active Problem List   Diagnosis Date Noted   Diverticulitis 12/23/2017   Vitamin D deficiency 11/18/2016   Type 2 diabetes mellitus without complication, without long-term current use of insulin ([place]) 09/14/2015   Morbid obesity ([place]) 09/11/2015   Migraine headache with aura 07/15/2013   Chronic insomnia 07/15/2013   Depression with anxiety 07/15/2013    Social Hx   Social History   Socioeconomic History   Marital status: Married    Spouse name: Not on file   Number of children: Not on file   Years of education: Not on file   Highest education level: Not on file  Occupational History   Not on file  Tobacco Use   Smoking status: Never   Smokeless tobacco: Never  Substance and Sexual Activity   Alcohol use: Yes    Comment: occassionally   Drug use: No   Sexual activity: Yes  Other Topics Concern   Not on file  Social History Narrative   Not on file   Social Determinants of Health   Financial Resource Strain: Not on file  Food  Insecurity: Not on file  Transportation Needs: Not on file  Physical Activity: Not on file  Stress: Not on file  Social Connections: Not on file    Review of Systems  Constitutional:        Feels poorly.  Neurological:  Positive for headaches.   Objective:  BP 130/85   Pulse 87   Temp 98.4 F (36.9 C) (Oral)   Ht 5' (1.524 m)   Wt 177 lb 12.8 oz (80.6 kg)   SpO2 100%   BMI 34.72 kg/m      03/07/2022   11:19 AM 11/30/2021    2:48 PM 11/30/2021    2:03 PM  BP/Weight  Systolic BP 093 235 573  Diastolic BP 85 72 80  Wt. (Lbs) 177.8  182.2  BMI 34.72 kg/m2  35.58 kg/m2    Physical Exam Vitals and nursing note reviewed.  Constitutional:      Appearance: Normal appearance. She is obese.  HENT:     Head: Normocephalic and atraumatic.  Eyes:     General:        Right eye: No discharge.        Left eye: No discharge.     Conjunctiva/sclera: Conjunctivae normal.  Pulmonary:     Effort: Pulmonary effort is normal. No respiratory distress.  Neurological:  Mental Status: She is alert.  Psychiatric:        Mood and Affect: Mood normal.        Behavior: Behavior normal.    Lab Results  Component Value Date   WBC 11.7 (H) 04/02/2021   HGB 14.3 04/02/2021   HCT 43.9 04/02/2021   PLT 281 04/02/2021   GLUCOSE 251 (H) 11/23/2021   CHOL 115 11/23/2021   TRIG 86 11/23/2021   HDL 59 11/23/2021   LDLCALC 39 11/23/2021   ALT 29 08/14/2021   AST 31 08/14/2021   NA 138 11/23/2021   K 4.0 11/23/2021   CL 101 11/23/2021   CREATININE 0.74 11/23/2021   BUN 10 11/23/2021   CO2 23 11/23/2021   TSH 0.911 03/28/2018   HGBA1C 9.2 (H) 11/23/2021     Assessment & Plan:   Problem List Items Addressed This Visit       Endocrine   Type 2 diabetes mellitus without complication, without long-term current use of insulin ([place])    Uncontrolled.  Worsening.  Continue metformin.  Starting glipizide.  Labs today.       Relevant Medications   glipiZIDE (GLUCOTROL XL) 5 MG 24  hr tablet    Meds ordered this encounter  Medications   glipiZIDE (GLUCOTROL XL) 5 MG 24 hr tablet    Sig: 5 mg daily with first main meal.    Dispense:  90 tablet    Refill:  1    Follow-up:  1-2 weeks  [place] DO [place]

## 2022-03-08 LAB — BASIC METABOLIC PANEL
BUN/Creatinine Ratio: 11 — ABNORMAL LOW (ref 12–28)
BUN: 8 mg/dL (ref 8–27)
CO2: 25 mmol/L (ref 20–29)
Calcium: 10 mg/dL (ref 8.7–10.3)
Chloride: 96 mmol/L (ref 96–106)
Creatinine, Ser: 0.75 mg/dL (ref 0.57–1.00)
Glucose: 269 mg/dL — ABNORMAL HIGH (ref 70–99)
Potassium: 4.2 mmol/L (ref 3.5–5.2)
Sodium: 136 mmol/L (ref 134–144)
eGFR: 91 mL/min/{1.73_m2} (ref >59)

## 2022-03-08 LAB — LIPID PANEL
Chol/HDL Ratio: 2.1 ratio (ref 0.0–4.4)
Cholesterol, Total: 133 mg/dL (ref 100–199)
HDL: 63 mg/dL (ref >39)
LDL Chol Calc (NIH): 52 mg/dL (ref 0–99)
Triglycerides: 100 mg/dL (ref 0–149)
VLDL Cholesterol Cal: 18 mg/dL (ref 5–40)

## 2022-03-08 LAB — HEPATIC FUNCTION PANEL
ALT: 17 IU/L (ref 0–32)
AST: 12 IU/L (ref 0–40)
Albumin: 4.9 g/dL (ref 3.8–4.9)
Alkaline Phosphatase: 73 IU/L (ref 44–121)
Bilirubin Total: 0.6 mg/dL (ref 0.0–1.2)
Bilirubin, Direct: 0.21 mg/dL (ref 0.00–0.40)
Total Protein: 7.1 g/dL (ref 6.0–8.5)

## 2022-03-08 LAB — HEMOGLOBIN A1C
Est. average glucose Bld gHb Est-mCnc: 266 mg/dL
Hgb A1c MFr Bld: 10.9 % — ABNORMAL HIGH (ref 4.8–5.6)

## 2022-03-08 LAB — MICROALBUMIN / CREATININE URINE RATIO
Creatinine, Urine: 53.8 mg/dL
Microalb/Creat Ratio: <6 mg/g creat (ref 0–29)
Microalbumin, Urine: <3 ug/mL

## 2022-03-18 ENCOUNTER — Ambulatory Visit: Payer: 59 | Admitting: Family Medicine

## 2022-03-27 ENCOUNTER — Other Ambulatory Visit: Payer: Self-pay | Admitting: *Deleted

## 2022-03-27 DIAGNOSIS — Z1231 Encounter for screening mammogram for malignant neoplasm of breast: Secondary | ICD-10-CM

## 2022-04-05 ENCOUNTER — Ambulatory Visit: Payer: 59 | Admitting: Family Medicine

## 2022-04-05 ENCOUNTER — Ambulatory Visit (HOSPITAL_COMMUNITY)
Admission: RE | Admit: 2022-04-05 | Discharge: 2022-04-05 | Disposition: A | Discharge status: Home / Self Care | Payer: 59 | Source: Ambulatory Visit | Attending: Family Medicine | Admitting: Family Medicine

## 2022-04-05 DIAGNOSIS — Z1231 Encounter for screening mammogram for malignant neoplasm of breast: Secondary | ICD-10-CM | POA: Insufficient documentation

## 2022-04-09 ENCOUNTER — Other Ambulatory Visit (HOSPITAL_COMMUNITY): Payer: Self-pay | Admitting: *Deleted

## 2022-04-09 ENCOUNTER — Other Ambulatory Visit (HOSPITAL_COMMUNITY): Payer: Self-pay | Admitting: Family Medicine

## 2022-04-09 DIAGNOSIS — R928 Other abnormal and inconclusive findings on diagnostic imaging of breast: Secondary | ICD-10-CM

## 2022-04-25 ENCOUNTER — Ambulatory Visit (HOSPITAL_COMMUNITY)
Admission: RE | Admit: 2022-04-25 | Discharge: 2022-04-25 | Disposition: A | Discharge status: Home / Self Care | Payer: 59 | Source: Ambulatory Visit | Attending: Family Medicine | Admitting: Family Medicine

## 2022-04-25 ENCOUNTER — Encounter (HOSPITAL_COMMUNITY): Payer: Self-pay

## 2022-04-25 DIAGNOSIS — R928 Other abnormal and inconclusive findings on diagnostic imaging of breast: Secondary | ICD-10-CM | POA: Insufficient documentation

## 2022-05-16 ENCOUNTER — Other Ambulatory Visit: Payer: Self-pay | Admitting: Family Medicine

## 2022-07-23 ENCOUNTER — Other Ambulatory Visit: Payer: Self-pay | Admitting: *Deleted

## 2022-07-23 MED ORDER — ESZOPICLONE 3 MG PO TABS: ORAL_TABLET | ORAL | 0 refills | Status: DC

## 2022-07-26 ENCOUNTER — Encounter: Payer: Self-pay | Admitting: Family Medicine

## 2022-07-26 DIAGNOSIS — E119 Type 2 diabetes mellitus without complications: Secondary | ICD-10-CM

## 2022-07-26 DIAGNOSIS — Z79899 Other long term (current) drug therapy: Secondary | ICD-10-CM

## 2022-07-29 NOTE — Telephone Encounter (Signed)
Nurses At this point what we need to occur is we will need to do a up-to-date visit with the patient including M5H and metabolic 7 before this visit so that we can discuss GLP-1 medicines potential side effects and benefits Also to at that point in time if patient is on board with starting a medication we can try to figure out which one her insurance covers.  Most of these medications often will require a secondary prior approval that this visit will help support the documentation necessary to do that.  Please contact patient to have her do the labs and schedule follow-up office visit with myself (follow-up office visit could be with Docia Barrier if patient prefers to get in sooner)  Below is additional information on GLP-1 medicines have a work and potential side effects  Discussion of GLP-1 medications Please remember these medications are to assist with weight reduction and diabetes control.  They do not take the place of healthy eating and regular physical activity. There are several GLP-1 medications that can help with weight reduction and diabetes control.  GLP-1 medications with indication for diabetes-Trulicity, Victoza, Bydureon , Mounjaro, Ozempic, and Rybelsus (although these are injected medicines except for Rybelsus which is a pill) GLP-1 medications with indications for weight loss-Wegovy, and Saxenda  Mechanism of action  These medications stimulate glucagon peptide receptors.  By doing so it does the following:  1-slows down stomach emptying-essentially food takes longer to go through the stomach and the intestines-this can lessen appetite  2-reduces glucagon secretion from the pancreas-this helps keep blood glucose levels stable between meals  3-increase his insulin release from the pancreas-with diabetics this helps keep blood glucose levels stable  4-promotes the feeling of being full in the brain-encouraged him receptors in the brain received the signal so the brain and body know  that it is time to stop eating Benefits of the medication  1-reduced weight-reduction of weight is more significant at higher dosing.  Not as much weight reduction with Rybelsus   A- typically Wegovy at higher doses can assist with significant weight reduction.  2-improved blood glucose levels-with diabetics typically we see a significant drop with A1c when the medication is adjusted appropriately.  3-decrease risk of heart attacks and strokes-individuals with type 2 diabetes are at increased risk of heart attacks and strokes.  These GLP-1 medications can reduce the risk by 22%.  Risk of GLP-1 medications-these are some of the risks.  It is important to talk with your provider in a shared discussion before starting any medication.  Contraindications to GLP-1 medications-in other words who should not take these.  1-individuals with history of thyroid medullary cancer  2-individuals with family history of multiple endocrine neoplasm syndrome type II (MEN 2)  3-individuals with a history of pancreatitis  4-those with a history of severe hypersensitivity or allergy reactions to GLP-1 medications  5-should be avoided in individuals with a history of suicidal attempts or active suicidal ideation.  Cautions include- 1- risk of thyroid C-cell tumors were seen in rats during clinical testing in humans the relevancy of this information has not been determined 2-risk of pancreatitis including fatal and nonfatal hemorrhagic or necrotizing pancreatitis 3-gallbladder disease slightly increased risk of gallstones 4-hypoglycemia-more common with individuals who are on insulin or sulfonylurea such as glipizide 5-acute kidney injury or worsening of chronic renal failure often this is triggered by severe vomiting and diarrhea as side effects to GLP-1. 6-slightly increased risk of diabetic retinopathy complications in patients with type 2 diabetes  7-heart rate increase-slight increase of base heart rate with  GLP-1 8-suicidal behavior and ideation has been reported in clinical trials with other weight loss medicines.  If depression occurs with medication or suicidal ideation stop medication immediately notify provider or seek help.  Common side effects include nausea, vomiting, diarrhea, constipation and increased heart rate.  Less common side effects severe abdominal pain-if this occurs notify provider stop medication get tested for pancreatitis. Full discussion with your provider should occur before starting these medicines.

## 2022-08-19 ENCOUNTER — Other Ambulatory Visit: Payer: Self-pay | Admitting: Family Medicine

## 2022-08-23 ENCOUNTER — Other Ambulatory Visit: Payer: Self-pay | Admitting: Family Medicine

## 2022-10-22 ENCOUNTER — Other Ambulatory Visit: Payer: Self-pay | Admitting: Family Medicine

## 2022-10-23 NOTE — Telephone Encounter (Signed)
Lipid, liver, metabolic 7, [id number] hypertension hyperlipidemia needs to do lab work and a follow-up visit' \\Once'$  she schedules a follow-up visit then I will refill this medicine

## 2022-10-24 ENCOUNTER — Other Ambulatory Visit: Payer: Self-pay | Admitting: Family Medicine

## 2022-10-24 DIAGNOSIS — E1169 Type 2 diabetes mellitus with other specified complication: Secondary | ICD-10-CM

## 2022-10-24 DIAGNOSIS — E119 Type 2 diabetes mellitus without complications: Secondary | ICD-10-CM

## 2022-10-24 DIAGNOSIS — Z79899 Other long term (current) drug therapy: Secondary | ICD-10-CM

## 2022-10-24 NOTE — Telephone Encounter (Signed)
Please contact patient to have her schedule appt. Lab orders placed. Thank you

## 2022-10-28 NOTE — Telephone Encounter (Signed)
Pt schedule med check appt for 18 th at 1:30 will do blood work but she said that she just has mouth surgery Friday the 12 th and she has some hydrocodone with acetaminophen because the Advil was not working in great deal of pain from surgery

## 2022-10-30 LAB — LIPID PANEL
Chol/HDL Ratio: 3.9 ratio (ref 0.0–4.4)
Cholesterol, Total: 165 mg/dL (ref 100–199)
HDL: 42 mg/dL (ref >39)
LDL Chol Calc (NIH): 104 mg/dL — ABNORMAL HIGH (ref 0–99)
Triglycerides: 104 mg/dL (ref 0–149)
VLDL Cholesterol Cal: 19 mg/dL (ref 5–40)

## 2022-10-30 LAB — HEPATIC FUNCTION PANEL
ALT: 20 IU/L (ref 0–32)
AST: 16 IU/L (ref 0–40)
Albumin: 4.3 g/dL (ref 3.8–4.9)
Alkaline Phosphatase: 74 IU/L (ref 44–121)
Bilirubin Total: 0.8 mg/dL (ref 0.0–1.2)
Bilirubin, Direct: 0.25 mg/dL (ref 0.00–0.40)
Total Protein: 6.8 g/dL (ref 6.0–8.5)

## 2022-10-30 LAB — BASIC METABOLIC PANEL
BUN/Creatinine Ratio: 11 — ABNORMAL LOW (ref 12–28)
BUN: 8 mg/dL (ref 8–27)
CO2: 22 mmol/L (ref 20–29)
Calcium: 9.4 mg/dL (ref 8.7–10.3)
Chloride: 99 mmol/L (ref 96–106)
Creatinine, Ser: 0.7 mg/dL (ref 0.57–1.00)
Glucose: 258 mg/dL — ABNORMAL HIGH (ref 70–99)
Potassium: 3.9 mmol/L (ref 3.5–5.2)
Sodium: 138 mmol/L (ref 134–144)
eGFR: 99 mL/min/{1.73_m2} (ref >59)

## 2022-10-30 LAB — HEMOGLOBIN A1C
Est. average glucose Bld gHb Est-mCnc: 258 mg/dL
Hgb A1c MFr Bld: 10.6 % — ABNORMAL HIGH (ref 4.8–5.6)

## 2022-10-31 ENCOUNTER — Encounter: Payer: Self-pay | Admitting: Family Medicine

## 2022-10-31 ENCOUNTER — Ambulatory Visit: Payer: 59 | Admitting: Family Medicine

## 2022-10-31 VITALS — BP 132/74 | Wt 183.6 lb

## 2022-10-31 DIAGNOSIS — Z1211 Encounter for screening for malignant neoplasm of colon: Secondary | ICD-10-CM | POA: Diagnosis not present

## 2022-10-31 DIAGNOSIS — E1169 Type 2 diabetes mellitus with other specified complication: Secondary | ICD-10-CM | POA: Diagnosis not present

## 2022-10-31 DIAGNOSIS — E785 Hyperlipidemia, unspecified: Secondary | ICD-10-CM

## 2022-10-31 DIAGNOSIS — E119 Type 2 diabetes mellitus without complications: Secondary | ICD-10-CM

## 2022-10-31 DIAGNOSIS — F5104 Psychophysiologic insomnia: Secondary | ICD-10-CM | POA: Diagnosis not present

## 2022-10-31 MED ORDER — TIRZEPATIDE 2.5 MG/0.5ML ~~LOC~~ SOAJ: SUBCUTANEOUS | 2 refills | Status: DC

## 2022-10-31 MED ORDER — ESZOPICLONE 2 MG PO TABS: 2.0000 mg | ORAL_TABLET | Freq: Every evening | ORAL | 5 refills | Status: DC | PRN

## 2022-10-31 MED ORDER — CITALOPRAM HYDROBROMIDE 40 MG PO TABS: 40.0000 mg | ORAL_TABLET | Freq: Every day | ORAL | 1 refills | Status: DC

## 2022-10-31 MED ORDER — METFORMIN HCL 500 MG PO TABS: ORAL_TABLET | ORAL | 1 refills | Status: DC

## 2022-10-31 MED ORDER — ROSUVASTATIN CALCIUM 20 MG PO TABS: 20.0000 mg | ORAL_TABLET | Freq: Every day | ORAL | 3 refills | Status: DC

## 2022-10-31 NOTE — Progress Notes (Signed)
   Subjective:    Patient ID: Molly Ryan, female    DOB: 1961-10-08, 62 y.o.   MRN: [id number]  Diabetes She presents for her follow-up diabetic visit. She has type 2 diabetes mellitus. There are no hypoglycemic associated symptoms. There are no diabetic associated symptoms. There are no hypoglycemic complications. There are no diabetic complications. Home blood sugar record trend: pt tries to check sugar daily; states sugar readings have not been good. She does not see a podiatrist.Eye exam is current.  Depression        This is a chronic problem.  Compliance with treatment is good (doing well on Celexa 40 mg).     Review of Systems  Psychiatric/Behavioral:  Positive for depression.        Objective:   Physical Exam  General-in no acute distress Eyes-no discharge Lungs-respiratory rate normal, CTA CV-no murmurs,RRR Extremities skin warm dry no edema Neuro grossly normal Behavior normal, alert       Assessment & Plan:  1. Type 2 diabetes mellitus without complication, without long-term current use of insulin (HCC) Poor control diabetes.  She states that she stopped taking the metformin because she states she felt bad with she denied any GI side effects she does states that she felt bad all the time and felt it was due to her medicines.  I did ask her if she ever felt that having an A1c that hovers around 10 might also be causing her to feel bad she did not feel that was the case.  She is a very nice patient but having a difficult time controlling her diabetes  In this particular situation I think she would benefit from endocrinology she would like to try [facility] but there is a distinct possibility she would also need to be on insulin 1 shot a day I did encourage her to restart metformin at 1 tablet twice a day but currently she is reluctant to do so - Ambulatory referral to Endocrinology  2. Encounter for screening colonoscopy Referral to gastroenterology - Ambulatory  referral to Gastroenterology  3. Hyperlipidemia associated with type 2 diabetes mellitus (HCC) Statin daily is a good idea, LDL is above goal hopefully as she loses weight gets diabetes under control this will look better if not in the future LDL will need to be below 70 if necessary bump up dose of statin  4. Morbid obesity ([place]) Portion control regular physical activity hopefully Mounjaro will help  5. Chronic insomnia We did discuss how Johnnye Sima is not a great idea for long-term use she has been on it for years I have encouraged her to start tapering she will cut it down to 2 mg  Follow-up in approximately 6 months

## 2022-11-01 ENCOUNTER — Encounter: Payer: Self-pay | Admitting: *Deleted

## 2022-11-25 ENCOUNTER — Telehealth: Payer: Self-pay | Admitting: Family Medicine

## 2022-11-25 MED ORDER — TIRZEPATIDE 5 MG/0.5ML ~~LOC~~ SOAJ: SUBCUTANEOUS | 2 refills | Status: DC

## 2022-11-25 NOTE — Telephone Encounter (Signed)
Medication sent to pharmacy and pt is aware 

## 2022-11-25 NOTE — Telephone Encounter (Signed)
Go ahead and increase Mounjaro 5 mg once weekly, 1 month supply with 2 additional refills She can give [place] feedback in approximately 3 to 4 weeks if she is doing well on this dose and wants to go up to the higher dose we can certainly adjust

## 2022-11-25 NOTE — Telephone Encounter (Signed)
Pt calling in to see if Dr.Scott wanted to increase Mounjaro 2.5 to 5 mg. Pt states no side effects and her numbers have been good (ranging from 150-170). Please advise. Thank you.  [facility].

## 2022-11-25 NOTE — Addendum Note (Signed)
Addended by: Vicente Males on: 11/25/2022 03:05 PM   Modules accepted: Orders

## 2022-12-27 ENCOUNTER — Telehealth: Payer: Self-pay

## 2022-12-27 NOTE — Telephone Encounter (Signed)
Pt is calling wanting a increase of tirzepatide [facility]) 5 MG/0.5ML Pen to the next level [employer] [phone] - [place], [address] AT [place]   Pt call back [place]

## 2022-12-27 NOTE — Telephone Encounter (Signed)
[facility] does not have the 7.5 mg dosing ot 5mg  dosing currently die to back order. Patient advised and will call around to see if she can find the medication and call back to let us know so we can send it in

## 2022-12-27 NOTE — Telephone Encounter (Signed)
Go ahead and increase to 7.5 mg once weekly, 1 month supply with 3 refills She may give [place] an update on how she is tolerating and doing with this and if interested in going up again in 3 to 4 weeks let us know [id number], metabolic 7, urine ACR, lipid before next visit in July along with ALT Diabetes hyperlipidemia

## 2023-01-02 ENCOUNTER — Encounter: Payer: Self-pay | Admitting: Nurse Practitioner

## 2023-01-15 ENCOUNTER — Telehealth: Payer: Self-pay | Admitting: Family Medicine

## 2023-01-15 NOTE — Telephone Encounter (Signed)
Patient would like to be moved up to the next on tirzepatide [facility]) 5 MG/0.5ML Pen  [facility]

## 2023-01-15 NOTE — Telephone Encounter (Signed)
Patient would like to be moved up to the next on tirzepatide [facility]) 5 MG/0.5ML Pen  [facility]   Last OV January 2024 , next OV July 2024.

## 2023-01-15 NOTE — Telephone Encounter (Signed)
It would be fine to go ahead with Mounjaro 7.5 mg once weekly, 1 month supply with 3 refills Patient can give [place] feedback in approximately 3 weeks how that is doing and if she wants to go up to the next level Patient also will need to do metabolic 7, lipid, liver, A1c, urine ACR before her next visit due to diabetes hyperlipidemia hypertension thank you  (Remember it would be wise to prep the patient regarding shortage issues with GLP-1 medicine)

## 2023-01-16 ENCOUNTER — Other Ambulatory Visit: Payer: Self-pay | Admitting: *Deleted

## 2023-01-16 MED ORDER — TIRZEPATIDE 7.5 MG/0.5ML ~~LOC~~ SOAJ: 7.5000 mg | SUBCUTANEOUS | 0 refills | Status: DC

## 2023-01-16 NOTE — Telephone Encounter (Signed)
Prescription sent electronically to pharmacy. Patient notified. 

## 2023-01-27 ENCOUNTER — Telehealth: Payer: Self-pay | Admitting: *Deleted

## 2023-01-27 NOTE — Telephone Encounter (Signed)
[facility] Approved  Case [id number];XBJYNW:[id number];Review Type:Prior Auth;Coverage Start Date:12/28/2022;Coverage End Date:01/27/2024;

## 2023-01-29 NOTE — Patient Instructions (Signed)

## 2023-01-31 ENCOUNTER — Encounter: Payer: Self-pay | Admitting: Nurse Practitioner

## 2023-01-31 ENCOUNTER — Ambulatory Visit: Payer: 59 | Admitting: Nurse Practitioner

## 2023-01-31 VITALS — BP 120/80 | HR 65 | Ht 60.0 in | Wt 173.4 lb

## 2023-01-31 DIAGNOSIS — E119 Type 2 diabetes mellitus without complications: Secondary | ICD-10-CM | POA: Diagnosis not present

## 2023-01-31 LAB — POCT GLYCOSYLATED HEMOGLOBIN (HGB A1C): Hemoglobin A1C: 7.4 % — AB (ref 4.0–5.6)

## 2023-01-31 MED ORDER — METFORMIN HCL 500 MG PO TABS: 500.0000 mg | ORAL_TABLET | Freq: Two times a day (BID) | ORAL | 3 refills | Status: DC

## 2023-01-31 MED ORDER — TIRZEPATIDE 5 MG/0.5ML ~~LOC~~ SOAJ
5.0000 mg | SUBCUTANEOUS | 3 refills | Status: DC
Start: 2023-01-31 — End: 2023-03-25

## 2023-01-31 NOTE — Progress Notes (Signed)
Endocrinology Consult Note       01/31/2023, 11:54 AM   Subjective:    Patient ID: Molly Ryan, female    DOB: 1961/01/17.  Molly Ryan is being seen in consultation for management of currently uncontrolled symptomatic diabetes requested by  Babs Sciara, MD.   Past Medical History:  Diagnosis Date   Chronic insomnia    Migraine headache    Reflux     Past Surgical History:  Procedure Laterality Date   APPENDECTOMY     CHOLECYSTECTOMY     LAPAROSCOPIC BILATERAL SALPINGO OOPHERECTOMY Bilateral 2014   VAGINAL HYSTERECTOMY  2006    Social History   Socioeconomic History   Marital status: Married    Spouse name: Not on file   Number of children: Not on file   Years of education: Not on file   Highest education level: Not on file  Occupational History   Not on file  Tobacco Use   Smoking status: Never   Smokeless tobacco: Never  Substance and Sexual Activity   Alcohol use: Yes    Comment: occassionally   Drug use: No   Sexual activity: Yes  Other Topics Concern   Not on file  Social History Narrative   Not on file   Social Determinants of Health   Financial Resource Strain: Not on file  Food Insecurity: Not on file  Transportation Needs: Not on file  Physical Activity: Not on file  Stress: Not on file  Social Connections: Not on file    Family History  Problem Relation Age of Onset   Hypertension Brother    Cancer Brother        lung   Cancer Mother        lung   COPD Mother    Diabetes Maternal Grandmother     Outpatient Encounter Medications as of 01/31/2023  Medication Sig   citalopram (CELEXA) 40 MG tablet Take 1 tablet (40 mg total) by mouth daily.   eszopiclone (LUNESTA) 2 MG TABS tablet Take 1 tablet (2 mg total) by mouth at bedtime as needed for sleep. Take immediately before bedtime   meclizine (ANTIVERT) 25 MG tablet Take one tablet every 6 hours prn  dizziness. Caution drowiness   ondansetron (ZOFRAN ODT) 4 MG disintegrating tablet Take 1 tablet (4 mg total) by mouth every 8 (eight) hours as needed for nausea or vomiting.   rizatriptan (MAXALT-MLT) 10 MG disintegrating tablet Take 1 tablet (10 mg total) by mouth as needed for migraine. May repeat in 2 hours if needed   rosuvastatin (CRESTOR) 20 MG tablet Take 1 tablet (20 mg total) by mouth daily.   tirzepatide [facility]) 5 MG/0.5ML Pen Inject 5 mg into the skin once a week.   [DISCONTINUED] metFORMIN (GLUCOPHAGE) 500 MG tablet TAKE 2 TABLETS BY MOUTH TWICE DAILY   metFORMIN (GLUCOPHAGE) 500 MG tablet Take 1 tablet (500 mg total) by mouth 2 (two) times daily with a meal.   [DISCONTINUED] metFORMIN (GLUCOPHAGE) 500 MG tablet Take 1 tablet (500 mg total) by mouth 2 (two) times daily with a meal. TAKE 2 TABLETS BY MOUTH TWICE DAILY   [DISCONTINUED] tirzepatide (MOUNJARO) 7.5 MG/0.5ML Pen Inject 7.5  mg into the skin once a week. (Patient not taking: Reported on 01/31/2023)   No facility-administered encounter medications on file as of 01/31/2023.    ALLERGIES: Allergies  Allergen Reactions   Percocet [Oxycodone-Acetaminophen] Anaphylaxis   Morphine And Related     Muscle spasms   Amoxicillin Rash    VACCINATION STATUS: Immunization History  Administered Date(s) Administered   Influenza,inj,Quad PF,6+ Mos 09/11/2015, 11/15/2016, 08/22/2017, 06/30/2020   Influenza-Unspecified 08/01/2018, 07/29/2019, 08/05/2022   PFIZER(Purple Top)SARS-COV-2 Vaccination 10/25/2019, 11/25/2019   PNEUMOCOCCAL CONJUGATE-20 08/20/2021   Pneumococcal Polysaccharide-23 11/15/2016    Diabetes She presents for her initial diabetic visit. She has type 2 diabetes mellitus. Onset time: diagnosed at approx age of 75. Her disease course has been stable. There are no hypoglycemic associated symptoms. There are no hypoglycemic complications. There are no diabetic complications. Risk factors for coronary artery disease  include diabetes mellitus, dyslipidemia, obesity, sedentary lifestyle and post-menopausal. Current diabetic treatment includes oral agent (monotherapy) (Mounjaro). She is compliant with treatment most of the time. Her weight is decreasing steadily. She is following a generally healthy diet. When asked about meal planning, she reported none. She has not had a previous visit with a dietitian. She participates in exercise intermittently. Her home blood glucose trend is decreasing steadily. (She presents today for her consultation with no meter or logs to review.  Her POCT A1c today is 7.4%, improving from last visit of 10.6%.  She monitors glucose once daily and says readings are between 130-190.  She drinks mostly water, some diet soda and black coffee.  She skips breakfast most days, but eats lunch and supper and occasional snacks.  She does not engage in routine physical activity outside of work.  She is UTD on eye exam, does not follow with podiatry routinely.) An ACE inhibitor/angiotensin II receptor blocker is not being taken. She does not see a podiatrist.Eye exam is current.     Review of systems  Constitutional: + steadily decreasing body weight, current Body mass index is 33.86 kg/m., no fatigue, no subjective hyperthermia, no subjective hypothermia Eyes: no blurry vision, no xerophthalmia ENT: no sore throat, no nodules palpated in throat, no dysphagia/odynophagia, no hoarseness Cardiovascular: no chest pain, no shortness of breath, no palpitations, no leg swelling Respiratory: no cough, no shortness of breath Gastrointestinal: no nausea/vomiting/diarrhea Musculoskeletal: no muscle/joint aches Skin: no rashes, no hyperemia Neurological: no tremors, no numbness, no tingling, no dizziness Psychiatric: no depression, no anxiety  Objective:     BP 120/80 (BP Location: Left Arm, Patient Position: Sitting, Cuff Size: Large)   Pulse 65   Ht 5' (1.524 m)   Wt 173 lb 6.4 oz (78.7 kg)   BMI  33.86 kg/m   Wt Readings from Last 3 Encounters:  01/31/23 173 lb 6.4 oz (78.7 kg)  10/31/22 183 lb 9.6 oz (83.3 kg)  03/07/22 177 lb 12.8 oz (80.6 kg)     BP Readings from Last 3 Encounters:  01/31/23 120/80  10/31/22 132/74  03/07/22 130/85     Physical Exam- Limited  Constitutional:  Body mass index is 33.86 kg/m. , not in acute distress, normal state of mind Eyes:  EOMI, no exophthalmos Neck: Supple Cardiovascular: RRR, no murmurs, rubs, or gallops, no edema Respiratory: Adequate breathing efforts, no crackles, rales, rhonchi, or wheezing Musculoskeletal: no gross deformities, strength intact in all four extremities, no gross restriction of joint movements Skin:  no rashes, no hyperemia Neurological: no tremor with outstretched hands    CMP ( most recent) CMP  Component Value Date/Time   NA 138 10/29/2022 0747   K 3.9 10/29/2022 0747   CL 99 10/29/2022 0747   CO2 22 10/29/2022 0747   GLUCOSE 258 (H) 10/29/2022 0747   GLUCOSE 252 (H) 04/02/2021 1829   BUN 8 10/29/2022 0747   CREATININE 0.70 10/29/2022 0747   CALCIUM 9.4 10/29/2022 0747   PROT 6.8 10/29/2022 0747   ALBUMIN 4.3 10/29/2022 0747   AST 16 10/29/2022 0747   ALT 20 10/29/2022 0747   ALKPHOS 74 10/29/2022 0747   BILITOT 0.8 10/29/2022 0747   GFRNONAA >60 04/02/2021 1829   GFRAA 113 06/16/2020 0754     Diabetic Labs (most recent): Lab Results  Component Value Date   HGBA1C 7.4 (A) 01/31/2023   HGBA1C 10.6 (H) 10/29/2022   HGBA1C 10.9 (H) 03/07/2022     Lipid Panel ( most recent) Lipid Panel     Component Value Date/Time   CHOL 165 10/29/2022 0747   TRIG 104 10/29/2022 0747   HDL 42 10/29/2022 0747   CHOLHDL 3.9 10/29/2022 0747   CHOLHDL 2.9 01/21/2014 0945   VLDL 23 01/21/2014 0945   LDLCALC 104 (H) 10/29/2022 0747   LABVLDL 19 10/29/2022 0747      Lab Results  Component Value Date   TSH 0.911 03/28/2018   TSH 1.270 11/15/2016   TSH 1.073 01/21/2014            Assessment & Plan:   1) Type 2 diabetes mellitus without complication, without long-term current use of insulin  She presents today for her consultation with no meter or logs to review.  Her POCT A1c today is 7.4%, improving from last visit of 10.6%.  She monitors glucose once daily and says readings are between 130-190.  She drinks mostly water, some diet soda and black coffee.  She skips breakfast most days, but eats lunch and supper and occasional snacks.  She does not engage in routine physical activity outside of work.  She is UTD on eye exam, does not follow with podiatry routinely.  - Molly Ryan has currently uncontrolled symptomatic type 2 DM since 62 years of age, with most recent A1c of 7.4 %.   -Recent labs reviewed.  - I had a long discussion with her about the progressive nature of diabetes and the pathology behind its complications. -her diabetes is not currently complicated but she remains at a high risk for more acute and chronic complications which include CAD, CVA, CKD, retinopathy, and neuropathy. These are all discussed in detail with her.  The following Lifestyle Medicine recommendations according to American College of Lifestyle Medicine [facility]) were discussed and offered to patient and she agrees to start the journey:  A. Whole Foods, Plant-based plate comprising of fruits and vegetables, plant-based proteins, whole-grain carbohydrates was discussed in detail with the patient.   A list for source of those nutrients were also provided to the patient.  Patient will use only water or unsweetened tea for hydration. B.  The need to stay away from risky substances including alcohol, smoking; obtaining 7 to 9 hours of restorative sleep, at least 150 minutes of moderate intensity exercise weekly, the importance of healthy social connections,  and stress reduction techniques were discussed. C.  A full color page of  Calorie density of various food groups per pound showing  examples of each food groups was provided to the patient.  - I have counseled her on diet and weight management by adopting a carbohydrate restricted/protein rich diet. Patient  is encouraged to switch to unprocessed or minimally processed complex starch and increased protein intake (animal or plant source), fruits, and vegetables. -  she is advised to stick to a routine mealtimes to eat 3 meals a day and avoid unnecessary snacks (to snack only to correct hypoglycemia).   - she acknowledges that there is a room for improvement in her food and drink choices. - Suggestion is made for her to avoid simple carbohydrates from her diet including Cakes, Sweet Desserts, Ice Cream, Soda (diet and regular), Sweet Tea, Candies, Chips, Cookies, Store Bought Juices, Alcohol in Excess of 1-2 drinks a day, Artificial Sweeteners, Coffee Creamer, and "Sugar-free" Products. This will help patient to have more stable blood glucose profile and potentially avoid unintended weight gain.  - I have approached her with the following individualized plan to manage her diabetes and patient agrees:   -She is advised to lower her dose of Metformin to 500 mg twice daily after meals (to prevent GI upset).  I advised her to continue lower dose of Mounjaro 5 mg SQ weekly as pharmacies are having harder time getting higher doses in stock.  After discussion, she did note that her mom had thyroid cancer and we discussed the risks of this medication but she does not think the cancer started in the thyroid that it traveled there from other parts of the body.  -she is encouraged to start monitoring glucose 2 times daily, before breakfast and before bed, to log their readings on the clinic sheets provided, and bring them to review at follow up appointment in 4 weeks.  - Adjustment parameters are given to her for hypo and hyperglycemia in writing. - she is encouraged to call clinic for blood glucose levels less than 70 or above 300 mg /dl.  -  Specific targets for  A1c; LDL, HDL, and Triglycerides were discussed with the patient.  2) Blood Pressure /Hypertension:  her blood pressure is controlled to target without any antihypertensive medications.    3) Lipids/Hyperlipidemia:    Review of her recent lipid panel from 10/29/22 showed uncontrolled LDL at 104 .  she is advised to continue Crestor 20 mg daily at bedtime.  Side effects and precautions discussed with her.  4)  Weight/Diet:  her Body mass index is 33.86 kg/m.  -  clearly complicating her diabetes care.   she is a candidate for weight loss. I discussed with her the fact that loss of 5 - 10% of her  current body weight will have the most impact on her diabetes management.  Exercise, and detailed carbohydrates information provided  -  detailed on discharge instructions.  5) Chronic Care/Health Maintenance: -she is not on ACEI/ARB and is on Statin medications and is encouraged to initiate and continue to follow up with Ophthalmology, Dentist, Podiatrist at least yearly or according to recommendations, and advised to stay away from smoking. I have recommended yearly flu vaccine and pneumonia vaccine at least every 5 years; moderate intensity exercise for up to 150 minutes weekly; and sleep for at least 7 hours a day.  - she is advised to maintain close follow up with Babs Sciara, MD for primary care needs, as well as her other providers for optimal and coordinated care.   - Time spent in this patient care: 60 min, of which > 50% was spent in counseling her about her diabetes and the rest reviewing her blood glucose logs, discussing her hypoglycemia and hyperglycemia episodes, reviewing her current and previous labs/studies (  including abstraction from other facilities) and medications doses and developing a long term treatment plan based on the latest standards of care/guidelines; and documenting her care.    Please refer to Patient Instructions for Blood Glucose Monitoring and  Insulin/Medications Dosing Guide" in media tab for additional information. Please also refer to "Patient Self Inventory" in the Media tab for reviewed elements of pertinent patient history.  Molly Ryan participated in the discussions, expressed understanding, and voiced agreement with the above plans.  All questions were answered to her satisfaction. she is encouraged to contact clinic should she have any questions or concerns prior to her return visit.     Follow up plan: - Return in about 4 weeks (around 02/28/2023) for Diabetes F/U, Bring meter and logs.    Ronny Bacon, [facility] [address] [place], [place] [address] Phone: [phone] Fax: [phone]  01/31/2023, 11:54 AM

## 2023-02-04 ENCOUNTER — Other Ambulatory Visit: Payer: Self-pay

## 2023-02-04 MED ORDER — METFORMIN HCL 500 MG PO TABS: 500.0000 mg | ORAL_TABLET | Freq: Two times a day (BID) | ORAL | 3 refills | Status: DC

## 2023-02-20 ENCOUNTER — Telehealth: Payer: Self-pay | Admitting: *Deleted

## 2023-02-20 NOTE — Telephone Encounter (Signed)
Patient checked with her pharamcy - she was told that this is not available. She is asking what to do.   I called and left a message on the patient's voicemail, asking if she had checked with other pharmacies to see if they had the [facility]. Encouraged her to check around as some pharmacies may have while others do not. Also , I would ask Whitney about any alternatives , but that all these medications are on a back order.

## 2023-02-28 ENCOUNTER — Ambulatory Visit: Payer: 59 | Admitting: Nurse Practitioner

## 2023-03-20 ENCOUNTER — Other Ambulatory Visit: Payer: Self-pay | Admitting: Nurse Practitioner

## 2023-03-25 ENCOUNTER — Ambulatory Visit: Payer: 59 | Admitting: Nurse Practitioner

## 2023-03-25 ENCOUNTER — Encounter: Payer: Self-pay | Admitting: Nurse Practitioner

## 2023-03-25 VITALS — BP 121/85 | HR 66 | Ht 60.0 in | Wt 168.6 lb

## 2023-03-25 DIAGNOSIS — Z7985 Long-term (current) use of injectable non-insulin antidiabetic drugs: Secondary | ICD-10-CM

## 2023-03-25 DIAGNOSIS — E119 Type 2 diabetes mellitus without complications: Secondary | ICD-10-CM | POA: Diagnosis not present

## 2023-03-25 DIAGNOSIS — Z7984 Long term (current) use of oral hypoglycemic drugs: Secondary | ICD-10-CM

## 2023-03-25 MED ORDER — MOUNJARO 10 MG/0.5ML ~~LOC~~ SOAJ: 10.0000 mg | SUBCUTANEOUS | 1 refills | Status: DC

## 2023-03-25 NOTE — Progress Notes (Signed)
Endocrinology Follow Up Note       03/25/2023, 1:21 PM   Subjective:    Patient ID: Molly Ryan, female    DOB: 12-16-1960.  Molly Ryan is being seen in follow up after being seen in consultation for management of currently uncontrolled symptomatic diabetes requested by  Babs Sciara, MD.   Past Medical History:  Diagnosis Date   Chronic insomnia    Migraine headache    Reflux     Past Surgical History:  Procedure Laterality Date   APPENDECTOMY     CHOLECYSTECTOMY     LAPAROSCOPIC BILATERAL SALPINGO OOPHERECTOMY Bilateral 2014   VAGINAL HYSTERECTOMY  2006    Social History   Socioeconomic History   Marital status: Married    Spouse name: Not on file   Number of children: Not on file   Years of education: Not on file   Highest education level: Not on file  Occupational History   Not on file  Tobacco Use   Smoking status: Never   Smokeless tobacco: Never  Substance and Sexual Activity   Alcohol use: Yes    Comment: occassionally   Drug use: No   Sexual activity: Yes  Other Topics Concern   Not on file  Social History Narrative   Not on file   Social Determinants of Health   Financial Resource Strain: Not on file  Food Insecurity: Not on file  Transportation Needs: Not on file  Physical Activity: Not on file  Stress: Not on file  Social Connections: Not on file    Family History  Problem Relation Age of Onset   Hypertension Brother    Cancer Brother        lung   Cancer Mother        lung   COPD Mother    Diabetes Maternal Grandmother     Outpatient Encounter Medications as of 03/25/2023  Medication Sig   citalopram (CELEXA) 40 MG tablet Take 1 tablet (40 mg total) by mouth daily.   eszopiclone (LUNESTA) 2 MG TABS tablet Take 1 tablet (2 mg total) by mouth at bedtime as needed for sleep. Take immediately before bedtime   meclizine (ANTIVERT) 25 MG tablet Take  one tablet every 6 hours prn dizziness. Caution drowiness   metFORMIN (GLUCOPHAGE) 500 MG tablet Take 1 tablet (500 mg total) by mouth 2 (two) times daily with a meal.   ondansetron (ZOFRAN ODT) 4 MG disintegrating tablet Take 1 tablet (4 mg total) by mouth every 8 (eight) hours as needed for nausea or vomiting.   rizatriptan (MAXALT-MLT) 10 MG disintegrating tablet Take 1 tablet (10 mg total) by mouth as needed for migraine. May repeat in 2 hours if needed   rosuvastatin (CRESTOR) 20 MG tablet Take 1 tablet (20 mg total) by mouth daily.   tirzepatide (MOUNJARO) 10 MG/0.5ML Pen Inject 10 mg into the skin once a week.   [DISCONTINUED] tirzepatide [facility]) 5 MG/0.5ML Pen Inject 5 mg into the skin once a week.   [DISCONTINUED] tirzepatide [facility]) 5 MG/0.5ML Pen Inject 5 mg into the skin once a week. (Patient not taking: Reported on 03/25/2023)   No facility-administered encounter medications on file  as of 03/25/2023.    ALLERGIES: Allergies  Allergen Reactions   Percocet [Oxycodone-Acetaminophen] Anaphylaxis   Morphine And Codeine     Muscle spasms   Amoxicillin Rash    VACCINATION STATUS: Immunization History  Administered Date(s) Administered   Influenza,inj,Quad PF,6+ Mos 09/11/2015, 11/15/2016, 08/22/2017, 06/30/2020   Influenza-Unspecified 08/01/2018, 07/29/2019, 08/05/2022   PFIZER(Purple Top)SARS-COV-2 Vaccination 10/25/2019, 11/25/2019   PNEUMOCOCCAL CONJUGATE-20 08/20/2021   Pneumococcal Polysaccharide-23 11/15/2016    Diabetes She presents for her follow-up diabetic visit. She has type 2 diabetes mellitus. Onset time: diagnosed at approx age of 43. Her disease course has been stable. There are no hypoglycemic associated symptoms. There are no hypoglycemic complications. There are no diabetic complications. Risk factors for coronary artery disease include diabetes mellitus, dyslipidemia, obesity, sedentary lifestyle and post-menopausal. Current diabetic treatment includes  oral agent (monotherapy) (Mounjaro). She is compliant with treatment most of the time. Her weight is decreasing steadily. She is following a generally healthy diet. When asked about meal planning, she reported none. She has not had a previous visit with a dietitian. She participates in exercise intermittently. Her home blood glucose trend is decreasing steadily. Her breakfast blood glucose range is generally 140-180 mg/dl. (She presents today with her logs, no meter, showing slightly above target glycemic profile but she notes she does not check it fasting, checks it after she gets to work after her coffee in the morning.  She was not due for another A1c today.  She continues to work on her diet, still struggles with breakfast.  She notes she feels she is ready to move up to the 10 mg of Mounjaro, says she feels she has plateaued on her current dose.  She was previously on the 7.5 but had to back down to the 5 mg due to availability.) An ACE inhibitor/angiotensin II receptor blocker is not being taken. She does not see a podiatrist.Eye exam is current.     Review of systems  Constitutional: + steadily decreasing body weight, current Body mass index is 32.93 kg/m., no fatigue, no subjective hyperthermia, no subjective hypothermia Eyes: no blurry vision, no xerophthalmia ENT: no sore throat, no nodules palpated in throat, no dysphagia/odynophagia, no hoarseness Cardiovascular: no chest pain, no shortness of breath, no palpitations, no leg swelling Respiratory: no cough, no shortness of breath Gastrointestinal: no nausea/vomiting/diarrhea Musculoskeletal: no muscle/joint aches Skin: no rashes, no hyperemia Neurological: no tremors, no numbness, no tingling, no dizziness Psychiatric: no depression, no anxiety  Objective:     BP 121/85 (BP Location: Left Arm, Patient Position: Sitting, Cuff Size: Large)   Pulse 66   Ht 5' (1.524 m)   Wt 168 lb 9.6 oz (76.5 kg)   BMI 32.93 kg/m   Wt Readings  from Last 3 Encounters:  03/25/23 168 lb 9.6 oz (76.5 kg)  01/31/23 173 lb 6.4 oz (78.7 kg)  10/31/22 183 lb 9.6 oz (83.3 kg)     BP Readings from Last 3 Encounters:  03/25/23 121/85  01/31/23 120/80  10/31/22 132/74     Physical Exam- Limited  Constitutional:  Body mass index is 32.93 kg/m. , not in acute distress, normal state of mind Eyes:  EOMI, no exophthalmos Musculoskeletal: no gross deformities, strength intact in all four extremities, no gross restriction of joint movements Skin:  no rashes, no hyperemia Neurological: no tremor with outstretched hands   Diabetic Foot Exam - Simple   No data filed     CMP ( most recent) CMP     Component  Value Date/Time   NA 138 10/29/2022 0747   K 3.9 10/29/2022 0747   CL 99 10/29/2022 0747   CO2 22 10/29/2022 0747   GLUCOSE 258 (H) 10/29/2022 0747   GLUCOSE 252 (H) 04/02/2021 1829   BUN 8 10/29/2022 0747   CREATININE 0.70 10/29/2022 0747   CALCIUM 9.4 10/29/2022 0747   PROT 6.8 10/29/2022 0747   ALBUMIN 4.3 10/29/2022 0747   AST 16 10/29/2022 0747   ALT 20 10/29/2022 0747   ALKPHOS 74 10/29/2022 0747   BILITOT 0.8 10/29/2022 0747   GFRNONAA >60 04/02/2021 1829   GFRAA 113 06/16/2020 0754     Diabetic Labs (most recent): Lab Results  Component Value Date   HGBA1C 7.4 (A) 01/31/2023   HGBA1C 10.6 (H) 10/29/2022   HGBA1C 10.9 (H) 03/07/2022     Lipid Panel ( most recent) Lipid Panel     Component Value Date/Time   CHOL 165 10/29/2022 0747   TRIG 104 10/29/2022 0747   HDL 42 10/29/2022 0747   CHOLHDL 3.9 10/29/2022 0747   CHOLHDL 2.9 01/21/2014 0945   VLDL 23 01/21/2014 0945   LDLCALC 104 (H) 10/29/2022 0747   LABVLDL 19 10/29/2022 0747      Lab Results  Component Value Date   TSH 0.911 03/28/2018   TSH 1.270 11/15/2016   TSH 1.073 01/21/2014           Assessment & Plan:   1) Type 2 diabetes mellitus without complication, without long-term current use of insulin  She presents today with  her logs, no meter, showing slightly above target glycemic profile but she notes she does not check it fasting, checks it after she gets to work after her coffee in the morning.  She was not due for another A1c today.  She continues to work on her diet, still struggles with breakfast.  She notes she feels she is ready to move up to the 10 mg of Mounjaro, says she feels she has plateaued on her current dose.  She was previously on the 7.5 but had to back down to the 5 mg due to availability.  - Molly Ryan has currently uncontrolled symptomatic type 2 DM since 62 years of age, with most recent A1c of 7.4 %.   -Recent labs reviewed.  - I had a long discussion with her about the progressive nature of diabetes and the pathology behind its complications. -her diabetes is not currently complicated but she remains at a high risk for more acute and chronic complications which include CAD, CVA, CKD, retinopathy, and neuropathy. These are all discussed in detail with her.  The following Lifestyle Medicine recommendations according to American College of Lifestyle Medicine [facility]) were discussed and offered to patient and she agrees to start the journey:  A. Whole Foods, Plant-based plate comprising of fruits and vegetables, plant-based proteins, whole-grain carbohydrates was discussed in detail with the patient.   A list for source of those nutrients were also provided to the patient.  Patient will use only water or unsweetened tea for hydration. B.  The need to stay away from risky substances including alcohol, smoking; obtaining 7 to 9 hours of restorative sleep, at least 150 minutes of moderate intensity exercise weekly, the importance of healthy social connections,  and stress reduction techniques were discussed. C.  A full color page of  Calorie density of various food groups per pound showing examples of each food groups was provided to the patient.  - Nutritional counseling repeated at  each  appointment due to patients tendency to fall back in to old habits.  - The patient admits there is a room for improvement in their diet and drink choices. -  Suggestion is made for the patient to avoid simple carbohydrates from their diet including Cakes, Sweet Desserts / Pastries, Ice Cream, Soda (diet and regular), Sweet Tea, Candies, Chips, Cookies, Sweet Pastries, Store Bought Juices, Alcohol in Excess of 1-2 drinks a day, Artificial Sweeteners, Coffee Creamer, and "Sugar-free" Products. This will help patient to have stable blood glucose profile and potentially avoid unintended weight gain.   - I encouraged the patient to switch to unprocessed or minimally processed complex starch and increased protein intake (animal or plant source), fruits, and vegetables.   - Patient is advised to stick to a routine mealtimes to eat 3 meals a day and avoid unnecessary snacks (to snack only to correct hypoglycemia).  - I have approached her with the following individualized plan to manage her diabetes and patient agrees:   -She is advised to continue her Metformin 500 mg twice daily after meals (to prevent GI upset).  I did increase her Mounjaro to 10 mg SQ weekly (she can double up on her current 5 mg injections until she depletes her current supply).   -she is encouraged to continue monitoring glucose once daily, before breakfast, and to call the clinic if she had readings less than 70 or above 300 for 3 tests in a row.  - Adjustment parameters are given to her for hypo and hyperglycemia in writing.  - Specific targets for  A1c; LDL, HDL, and Triglycerides were discussed with the patient.  2) Blood Pressure /Hypertension:  her blood pressure is controlled to target without any antihypertensive medications.    3) Lipids/Hyperlipidemia:    Review of her recent lipid panel from 10/29/22 showed uncontrolled LDL at 104 .  she is advised to continue Crestor 20 mg daily at bedtime.  Side effects and  precautions discussed with her.  4)  Weight/Diet:  her Body mass index is 32.93 kg/m.  -  clearly complicating her diabetes care.   she is a candidate for weight loss. I discussed with her the fact that loss of 5 - 10% of her  current body weight will have the most impact on her diabetes management.  Exercise, and detailed carbohydrates information provided  -  detailed on discharge instructions.  5) Chronic Care/Health Maintenance: -she is not on ACEI/ARB and is on Statin medications and is encouraged to initiate and continue to follow up with Ophthalmology, Dentist, Podiatrist at least yearly or according to recommendations, and advised to stay away from smoking. I have recommended yearly flu vaccine and pneumonia vaccine at least every 5 years; moderate intensity exercise for up to 150 minutes weekly; and sleep for at least 7 hours a day.  - she is advised to maintain close follow up with Babs Sciara, MD for primary care needs, as well as her other providers for optimal and coordinated care.     I spent  34  minutes in the care of the patient today including review of labs from CMP, Lipids, Thyroid Function, Hematology (current and previous including abstractions from other facilities); face-to-face time discussing  her blood glucose readings/logs, discussing hypoglycemia and hyperglycemia episodes and symptoms, medications doses, her options of short and long term treatment based on the latest standards of care / guidelines;  discussion about incorporating lifestyle medicine;  and documenting the encounter. Risk reduction counseling  performed per USPSTF guidelines to reduce obesity and cardiovascular risk factors.     Please refer to Patient Instructions for Blood Glucose Monitoring and Insulin/Medications Dosing Guide"  in media tab for additional information. Please  also refer to " Patient Self Inventory" in the Media  tab for reviewed elements of pertinent patient history.  Molly Ryan participated in the discussions, expressed understanding, and voiced agreement with the above plans.  All questions were answered to her satisfaction. she is encouraged to contact clinic should she have any questions or concerns prior to her return visit.     Follow up plan: - Return in about 3 months (around 06/25/2023) for Diabetes F/U with A1c in office, No previsit labs, Bring meter and logs.   Ronny Bacon, [facility] [address] [place], [place] [address] Phone: [phone] Fax: [phone]  03/25/2023, 1:21 PM

## 2023-04-15 ENCOUNTER — Encounter: Payer: Self-pay | Admitting: *Deleted

## 2023-04-28 ENCOUNTER — Other Ambulatory Visit: Payer: Self-pay | Admitting: Family Medicine

## 2023-04-28 LAB — HM DIABETES EYE EXAM

## 2023-04-30 ENCOUNTER — Telehealth: Payer: Self-pay | Admitting: *Deleted

## 2023-04-30 ENCOUNTER — Other Ambulatory Visit (HOSPITAL_COMMUNITY): Payer: Self-pay

## 2023-04-30 NOTE — Telephone Encounter (Signed)
Patient left a message asking about a PA for her Mounjaro 10 mg. The PA number is [id number]. She said in her message that this was under [facility]. Our office has not seen this. We will follow up with the RX PA team to see if they have seen anything. Patient states that her next dose is due this upcoming Sunday.

## 2023-05-01 NOTE — Telephone Encounter (Signed)
I have called the patient and shared this information. She states that [facility] told her that her Insurance had kicked back the request, stating that a PA was needed. She is going to call the [facility] and see if things have changed. If not, she is going to try and get the specific information that is being needed.

## 2023-05-02 ENCOUNTER — Ambulatory Visit: Payer: 59 | Admitting: Family Medicine

## 2023-05-28 ENCOUNTER — Other Ambulatory Visit: Payer: Self-pay | Admitting: Family Medicine

## 2023-06-02 ENCOUNTER — Encounter: Payer: Self-pay | Admitting: *Deleted

## 2023-06-02 ENCOUNTER — Telehealth: Payer: Self-pay | Admitting: Family Medicine

## 2023-06-02 NOTE — Telephone Encounter (Signed)
Refill on eszopiclone (LUNESTA) 2 MG TABS tablet  send [facility]

## 2023-06-03 ENCOUNTER — Other Ambulatory Visit: Payer: Self-pay | Admitting: Family Medicine

## 2023-06-03 MED ORDER — ESZOPICLONE 2 MG PO TABS: ORAL_TABLET | ORAL | 0 refills | Status: DC

## 2023-06-03 NOTE — Telephone Encounter (Signed)
I sent in 1 additional refill.  On her most recent refill we put on there that she needs to schedule an office visit.  She has failed to do so  Please make sure you talk with her and document that this is the final refill this is a controlled medicine she needs a follow-up visit every 6 months this is not a new rule.  She should be fully aware of this.  If she wants any further refills she needs to go ahead and schedule an office visit  Controlled medications require an office visit every 6 months for [place] to do the refills

## 2023-06-03 NOTE — Telephone Encounter (Signed)
We can order any additional lab work at the time of her visit if additional lab work is necessary otherwise we will just utilize what endocrinology has already tested thank you

## 2023-06-03 NOTE — Telephone Encounter (Signed)
Patient notified and scheduled office visit- does she needs labs before appt- she sees endocrinology for A1cs and already has that blood work and ov scheduled

## 2023-06-04 NOTE — Telephone Encounter (Signed)
Called pt and informed her she does not need any labs at this if any are needed she will discuss with Dr Lorin Picket at the visit

## 2023-06-13 ENCOUNTER — Encounter: Payer: Self-pay | Admitting: Family Medicine

## 2023-06-13 ENCOUNTER — Ambulatory Visit: Payer: 59 | Admitting: Family Medicine

## 2023-06-13 VITALS — BP 112/72 | HR 80 | Temp 97.2°F | Ht 60.0 in | Wt 159.0 lb

## 2023-06-13 DIAGNOSIS — Z79899 Other long term (current) drug therapy: Secondary | ICD-10-CM

## 2023-06-13 DIAGNOSIS — E785 Hyperlipidemia, unspecified: Secondary | ICD-10-CM

## 2023-06-13 DIAGNOSIS — E1169 Type 2 diabetes mellitus with other specified complication: Secondary | ICD-10-CM | POA: Diagnosis not present

## 2023-06-13 DIAGNOSIS — E119 Type 2 diabetes mellitus without complications: Secondary | ICD-10-CM | POA: Diagnosis not present

## 2023-06-13 DIAGNOSIS — F5104 Psychophysiologic insomnia: Secondary | ICD-10-CM

## 2023-06-13 MED ORDER — PROMETHAZINE HCL 25 MG PO TABS: ORAL_TABLET | ORAL | 2 refills | Status: DC

## 2023-06-13 MED ORDER — ONDANSETRON HCL 8 MG PO TABS: 8.0000 mg | ORAL_TABLET | Freq: Three times a day (TID) | ORAL | 2 refills | Status: DC | PRN

## 2023-06-13 MED ORDER — ESZOPICLONE 3 MG PO TABS: ORAL_TABLET | ORAL | 5 refills | Status: DC

## 2023-06-13 NOTE — Progress Notes (Signed)
   Subjective:    Patient ID: Molly Ryan, female    DOB: [birth year], 62 y.o.   MRN: [id number]  HPI Follow up hyperlipidemia  Refill nausea medication Lab work ordered today  Patient sees endocrinology regular basis taking her medicines on a regular basis has been able to lose some weight with the [facility] Moods are doing well with the Celexa under a lot of stress at work Difficult time sleeping the 2 mg Lunesta was not helping enough she was not 3 we convinced her to taper down to 2 she states that she just does not sleep at all I did try to educate her that as she heads into her mid 74s that this medication will not be something that we would recommend continuing because of increased risk of accidental falls and injuries she states currently with her work schedule it is absolutely necessary for her to be on medication at nighttime to get some sleep Review of Systems     Objective:   Physical Exam  General-in no acute distress Eyes-no discharge Lungs-respiratory rate normal, CTA CV-no murmurs,RRR Extremities skin warm dry no edema Neuro grossly normal Behavior normal, alert       Assessment & Plan:   1. Chronic insomnia Patient states the 2 mg is not getting her much sleep.  She feels she needs go back to the 3 mg.  We did discuss how once she gets to be age 30 and above these medications are not recommended Given her difficult situation we will go back to the 3 mg  2. Type 2 diabetes mellitus without complication, without long-term current use of insulin (HCC) she is being followed by endocrinology but she is due for a urine micro protein so therefore we will order this since she is getting blood work we will also order the other labs then share them with endocrinology Continue medication healthy diet check labs before next visit with endocrinology Sees endocrinology - Hemoglobin A1c - Basic Metabolic Panel - Microalbumin/Creatinine Ratio, Urine  3. Hyperlipidemia  associated with type 2 diabetes mellitus (HCC) Patient will do her labs in the coming weeks, we have her on medication the goal is to get LDL below 70.  We need to do lab work. - Lipid Panel  4. High risk medication use Lab work ordered - Hepatic Function Panel  Follow-up 6 months

## 2023-06-13 NOTE — Patient Instructions (Signed)

## 2023-06-25 LAB — BASIC METABOLIC PANEL
BUN/Creatinine Ratio: 18 (ref 12–28)
BUN: 14 mg/dL (ref 8–27)
CO2: 23 mmol/L (ref 20–29)
Calcium: 10.1 mg/dL (ref 8.7–10.3)
Chloride: 101 mmol/L (ref 96–106)
Creatinine, Ser: 0.8 mg/dL (ref 0.57–1.00)
Glucose: 145 mg/dL — ABNORMAL HIGH (ref 70–99)
Potassium: 4.4 mmol/L (ref 3.5–5.2)
Sodium: 139 mmol/L (ref 134–144)
eGFR: 84 mL/min/{1.73_m2} (ref >59)

## 2023-06-25 LAB — MICROALBUMIN / CREATININE URINE RATIO
Creatinine, Urine: 143 mg/dL
Microalb/Creat Ratio: 13 mg/g{creat} (ref 0–29)
Microalbumin, Urine: 19.3 ug/mL

## 2023-06-25 LAB — LIPID PANEL
Chol/HDL Ratio: 2.1 ratio (ref 0.0–4.4)
Cholesterol, Total: 130 mg/dL (ref 100–199)
HDL: 61 mg/dL (ref >39)
LDL Chol Calc (NIH): 50 mg/dL (ref 0–99)
Triglycerides: 102 mg/dL (ref 0–149)
VLDL Cholesterol Cal: 19 mg/dL (ref 5–40)

## 2023-06-25 LAB — HEPATIC FUNCTION PANEL
ALT: 12 IU/L (ref 0–32)
AST: 12 IU/L (ref 0–40)
Albumin: 4.6 g/dL (ref 3.9–4.9)
Alkaline Phosphatase: 62 IU/L (ref 44–121)
Bilirubin Total: 0.5 mg/dL (ref 0.0–1.2)
Bilirubin, Direct: 0.16 mg/dL (ref 0.00–0.40)
Total Protein: 7.2 g/dL (ref 6.0–8.5)

## 2023-06-25 LAB — HEMOGLOBIN A1C
Est. average glucose Bld gHb Est-mCnc: 134 mg/dL
Hgb A1c MFr Bld: 6.3 % — ABNORMAL HIGH (ref 4.8–5.6)

## 2023-07-04 ENCOUNTER — Ambulatory Visit: Payer: 59 | Admitting: Nurse Practitioner

## 2023-07-04 ENCOUNTER — Encounter: Payer: Self-pay | Admitting: Nurse Practitioner

## 2023-07-04 VITALS — BP 122/78 | HR 73 | Ht 60.0 in | Wt 158.2 lb

## 2023-07-04 DIAGNOSIS — Z7984 Long term (current) use of oral hypoglycemic drugs: Secondary | ICD-10-CM | POA: Diagnosis not present

## 2023-07-04 DIAGNOSIS — E119 Type 2 diabetes mellitus without complications: Secondary | ICD-10-CM | POA: Diagnosis not present

## 2023-07-04 DIAGNOSIS — E782 Mixed hyperlipidemia: Secondary | ICD-10-CM

## 2023-07-04 DIAGNOSIS — Z7985 Long-term (current) use of injectable non-insulin antidiabetic drugs: Secondary | ICD-10-CM | POA: Diagnosis not present

## 2023-07-04 MED ORDER — TIRZEPATIDE 12.5 MG/0.5ML ~~LOC~~ SOAJ
12.5000 mg | SUBCUTANEOUS | 1 refills | Status: DC
Start: 2023-07-04 — End: 2023-12-05

## 2023-07-04 NOTE — Progress Notes (Signed)
Endocrinology Follow Up Note       07/04/2023, 10:25 AM   Subjective:    Patient ID: Molly Ryan, female    DOB: 10-04-1961.  Molly Ryan is being seen in follow up after being seen in consultation for management of currently uncontrolled symptomatic diabetes requested by  Babs Sciara, MD.   Past Medical History:  Diagnosis Date   Chronic insomnia    Migraine headache    Reflux     Past Surgical History:  Procedure Laterality Date   APPENDECTOMY     CHOLECYSTECTOMY     LAPAROSCOPIC BILATERAL SALPINGO OOPHERECTOMY Bilateral 2014   VAGINAL HYSTERECTOMY  2006    Social History   Socioeconomic History   Marital status: Married    Spouse name: Not on file   Number of children: Not on file   Years of education: Not on file   Highest education level: Not on file  Occupational History   Not on file  Tobacco Use   Smoking status: Never   Smokeless tobacco: Never  Substance and Sexual Activity   Alcohol use: Yes    Comment: occassionally   Drug use: No   Sexual activity: Yes  Other Topics Concern   Not on file  Social History Narrative   Not on file   Social Determinants of Health   Financial Resource Strain: Not on file  Food Insecurity: Not on file  Transportation Needs: Not on file  Physical Activity: Not on file  Stress: Not on file  Social Connections: Not on file    Family History  Problem Relation Age of Onset   Hypertension Brother    Cancer Brother        lung   Cancer Mother        lung   COPD Mother    Diabetes Maternal Grandmother     Outpatient Encounter Medications as of 07/04/2023  Medication Sig   citalopram (CELEXA) 40 MG tablet Take 1 tablet (40 mg total) by mouth daily.   eszopiclone 3 MG TABS 1 nightly as needed   meclizine (ANTIVERT) 25 MG tablet Take one tablet every 6 hours prn dizziness. Caution drowiness   metFORMIN (GLUCOPHAGE) 500 MG tablet  Take 1 tablet (500 mg total) by mouth 2 (two) times daily with a meal.   ondansetron (ZOFRAN) 8 MG tablet Take 1 tablet (8 mg total) by mouth every 8 (eight) hours as needed for nausea or vomiting.   promethazine (PHENERGAN) 25 MG tablet 1/2 to 1 q 8 hours prn nausea caution drowsiness   rizatriptan (MAXALT-MLT) 10 MG disintegrating tablet Take 1 tablet (10 mg total) by mouth as needed for migraine. May repeat in 2 hours if needed   rosuvastatin (CRESTOR) 20 MG tablet Take 1 tablet (20 mg total) by mouth daily.   tirzepatide (MOUNJARO) 12.5 MG/0.5ML Pen Inject 12.5 mg into the skin once a week.   [DISCONTINUED] tirzepatide (MOUNJARO) 10 MG/0.5ML Pen Inject 10 mg into the skin once a week.   No facility-administered encounter medications on file as of 07/04/2023.    ALLERGIES: Allergies  Allergen Reactions   Percocet [Oxycodone-Acetaminophen] Anaphylaxis   Morphine And Codeine  Muscle spasms   Amoxicillin Rash    VACCINATION STATUS: Immunization History  Administered Date(s) Administered   Influenza,inj,Quad PF,6+ Mos 09/11/2015, 11/15/2016, 08/22/2017, 06/30/2020   Influenza-Unspecified 08/01/2018, 07/29/2019, 08/05/2022   PFIZER(Purple Top)SARS-COV-2 Vaccination 10/25/2019, 11/25/2019   PNEUMOCOCCAL CONJUGATE-20 08/20/2021   Pneumococcal Polysaccharide-23 11/15/2016    Diabetes She presents for her follow-up diabetic visit. She has type 2 diabetes mellitus. Onset time: diagnosed at approx age of 24. Her disease course has been improving. There are no hypoglycemic associated symptoms. There are no hypoglycemic complications. There are no diabetic complications. Risk factors for coronary artery disease include diabetes mellitus, dyslipidemia, obesity, sedentary lifestyle and post-menopausal. Current diabetic treatment includes oral agent (monotherapy) (Mounjaro). She is compliant with treatment most of the time. Her weight is decreasing steadily. She is following a generally healthy  diet. When asked about meal planning, she reported none. She has not had a previous visit with a dietitian. She participates in exercise intermittently. (She presents today with no meter or logs to review (accidentally left them at work).  Her previsit A1c don on 9/10 was 6.3%, improving from last visit of 7.4%.  Her weight loss has plateaued.  She does note some nausea, especially if she has eaten something she knows she shouldn't or if she waits too long between meals.) An ACE inhibitor/angiotensin II receptor blocker is not being taken. She does not see a podiatrist.Eye exam is current.     Review of systems  Constitutional: + steadily decreasing body weight, current Body mass index is 30.9 kg/m., no fatigue, no subjective hyperthermia, no subjective hypothermia Eyes: no blurry vision, no xerophthalmia ENT: no sore throat, no nodules palpated in throat, no dysphagia/odynophagia, no hoarseness Cardiovascular: no chest pain, no shortness of breath, no palpitations, no leg swelling Respiratory: no cough, no shortness of breath Gastrointestinal: no nausea/vomiting/diarrhea Musculoskeletal: no muscle/joint aches Skin: no rashes, no hyperemia Neurological: no tremors, no numbness, no tingling, no dizziness Psychiatric: no depression, no anxiety  Objective:     BP 122/78 (BP Location: Left Arm, Patient Position: Sitting, Cuff Size: Large)   Pulse 73   Ht 5' (1.524 m)   Wt 158 lb 3.2 oz (71.8 kg)   BMI 30.90 kg/m   Wt Readings from Last 3 Encounters:  07/04/23 158 lb 3.2 oz (71.8 kg)  06/13/23 159 lb (72.1 kg)  03/25/23 168 lb 9.6 oz (76.5 kg)     BP Readings from Last 3 Encounters:  07/04/23 122/78  06/13/23 112/72  03/25/23 121/85      Physical Exam- Limited  Constitutional:  Body mass index is 30.9 kg/m. , not in acute distress, normal state of mind Eyes:  EOMI, no exophthalmos Musculoskeletal: no gross deformities, strength intact in all four extremities, no gross  restriction of joint movements Skin:  no rashes, no hyperemia Neurological: no tremor with outstretched hands   Diabetic Foot Exam - Simple   No data filed     CMP ( most recent) CMP     Component Value Date/Time   NA 139 06/24/2023 0741   K 4.4 06/24/2023 0741   CL 101 06/24/2023 0741   CO2 23 06/24/2023 0741   GLUCOSE 145 (H) 06/24/2023 0741   GLUCOSE 252 (H) 04/02/2021 1829   BUN 14 06/24/2023 0741   CREATININE 0.80 06/24/2023 0741   CALCIUM 10.1 06/24/2023 0741   PROT 7.2 06/24/2023 0741   ALBUMIN 4.6 06/24/2023 0741   AST 12 06/24/2023 0741   ALT 12 06/24/2023 0741   ALKPHOS 62  06/24/2023 0741   BILITOT 0.5 06/24/2023 0741   GFRNONAA >60 04/02/2021 1829   GFRAA 113 06/16/2020 0754     Diabetic Labs (most recent): Lab Results  Component Value Date   HGBA1C 6.3 (H) 06/24/2023   HGBA1C 7.4 (A) 01/31/2023   HGBA1C 10.6 (H) 10/29/2022     Lipid Panel ( most recent) Lipid Panel     Component Value Date/Time   CHOL 130 06/24/2023 0741   TRIG 102 06/24/2023 0741   HDL 61 06/24/2023 0741   CHOLHDL 2.1 06/24/2023 0741   CHOLHDL 2.9 01/21/2014 0945   VLDL 23 01/21/2014 0945   LDLCALC 50 06/24/2023 0741   LABVLDL 19 06/24/2023 0741      Lab Results  Component Value Date   TSH 0.911 03/28/2018   TSH 1.270 11/15/2016   TSH 1.073 01/21/2014           Assessment & Plan:   1) Type 2 diabetes mellitus without complication, without long-term current use of insulin  She presents today with no meter or logs to review (accidentally left them at work).  Her previsit A1c don on 9/10 was 6.3%, improving from last visit of 7.4%.  Her weight loss has plateaued.  She does note some nausea, especially if she has eaten something she knows she shouldn't or if she waits too long between meals.  - Molly Ryan has currently uncontrolled symptomatic type 2 DM since 62 years of age, with most recent A1c of 7.4 %.   -Recent labs reviewed.  - I had a long discussion  with her about the progressive nature of diabetes and the pathology behind its complications. -her diabetes is not currently complicated but she remains at a high risk for more acute and chronic complications which include CAD, CVA, CKD, retinopathy, and neuropathy. These are all discussed in detail with her.  The following Lifestyle Medicine recommendations according to American College of Lifestyle Medicine [facility]) were discussed and offered to patient and she agrees to start the journey:  A. Whole Foods, Plant-based plate comprising of fruits and vegetables, plant-based proteins, whole-grain carbohydrates was discussed in detail with the patient.   A list for source of those nutrients were also provided to the patient.  Patient will use only water or unsweetened tea for hydration. B.  The need to stay away from risky substances including alcohol, smoking; obtaining 7 to 9 hours of restorative sleep, at least 150 minutes of moderate intensity exercise weekly, the importance of healthy social connections,  and stress reduction techniques were discussed. C.  A full color page of  Calorie density of various food groups per pound showing examples of each food groups was provided to the patient.  - Nutritional counseling repeated at each appointment due to patients tendency to fall back in to old habits.  - The patient admits there is a room for improvement in their diet and drink choices. -  Suggestion is made for the patient to avoid simple carbohydrates from their diet including Cakes, Sweet Desserts / Pastries, Ice Cream, Soda (diet and regular), Sweet Tea, Candies, Chips, Cookies, Sweet Pastries, Store Bought Juices, Alcohol in Excess of 1-2 drinks a day, Artificial Sweeteners, Coffee Creamer, and "Sugar-free" Products. This will help patient to have stable blood glucose profile and potentially avoid unintended weight gain.   - I encouraged the patient to switch to unprocessed or minimally processed  complex starch and increased protein intake (animal or plant source), fruits, and vegetables.   - Patient  is advised to stick to a routine mealtimes to eat 3 meals a day and avoid unnecessary snacks (to snack only to correct hypoglycemia).  - I have approached her with the following individualized plan to manage her diabetes and patient agrees:   -She is advised to continue her Metformin 500 mg twice daily after meals (to prevent GI upset).  I did increase her Mounjaro to 12.5 mg SQ weekly (she can use up her current 10 mg injections first).   -she is encouraged to continue monitoring glucose once daily, before breakfast, and to call the clinic if she had readings less than 70 or above 300 for 3 tests in a row.  - Adjustment parameters are given to her for hypo and hyperglycemia in writing.  - Specific targets for  A1c; LDL, HDL, and Triglycerides were discussed with the patient.  2) Blood Pressure /Hypertension:  her blood pressure is controlled to target without any antihypertensive medications.    3) Lipids/Hyperlipidemia:    Review of her recent lipid panel from 06/24/23 showed controlled LDL at 50, improving greatly.  she is advised to continue Crestor 20 mg daily at bedtime.  Side effects and precautions discussed with her.  4)  Weight/Diet:  her Body mass index is 30.9 kg/m.  -  clearly complicating her diabetes care.   she is a candidate for weight loss. I discussed with her the fact that loss of 5 - 10% of her  current body weight will have the most impact on her diabetes management.  Exercise, and detailed carbohydrates information provided  -  detailed on discharge instructions.  5) Chronic Care/Health Maintenance: -she is not on ACEI/ARB and is on Statin medications and is encouraged to initiate and continue to follow up with Ophthalmology, Dentist, Podiatrist at least yearly or according to recommendations, and advised to stay away from smoking. I have recommended yearly flu  vaccine and pneumonia vaccine at least every 5 years; moderate intensity exercise for up to 150 minutes weekly; and sleep for at least 7 hours a day.  - she is advised to maintain close follow up with Babs Sciara, MD for primary care needs, as well as her other providers for optimal and coordinated care.      I spent  46  minutes in the care of the patient today including review of labs from CMP, Lipids, Thyroid Function, Hematology (current and previous including abstractions from other facilities); face-to-face time discussing  her blood glucose readings/logs, discussing hypoglycemia and hyperglycemia episodes and symptoms, medications doses, her options of short and long term treatment based on the latest standards of care / guidelines;  discussion about incorporating lifestyle medicine;  and documenting the encounter. Risk reduction counseling performed per USPSTF guidelines to reduce obesity and cardiovascular risk factors.     Please refer to Patient Instructions for Blood Glucose Monitoring and Insulin/Medications Dosing Guide"  in media tab for additional information. Please  also refer to " Patient Self Inventory" in the Media  tab for reviewed elements of pertinent patient history.  Molly Ryan participated in the discussions, expressed understanding, and voiced agreement with the above plans.  All questions were answered to her satisfaction. she is encouraged to contact clinic should she have any questions or concerns prior to her return visit.     Follow up plan: - Return in about 4 months (around 11/03/2023) for Diabetes F/U with A1c in office, No previsit labs.   Ronny Bacon, FNP-BC [facility] [address]  [address] [place], [place] [address] Phone: [phone] Fax: [phone]  07/04/2023, 10:25 AM

## 2023-08-15 ENCOUNTER — Other Ambulatory Visit: Payer: Self-pay | Admitting: Nurse Practitioner

## 2023-08-15 ENCOUNTER — Ambulatory Visit: Payer: Self-pay

## 2023-08-15 DIAGNOSIS — M79662 Pain in left lower leg: Secondary | ICD-10-CM

## 2023-08-15 DIAGNOSIS — M25562 Pain in left knee: Secondary | ICD-10-CM

## 2023-08-21 ENCOUNTER — Emergency Department (HOSPITAL_COMMUNITY): Payer: 59

## 2023-08-21 ENCOUNTER — Other Ambulatory Visit: Payer: Self-pay

## 2023-08-21 ENCOUNTER — Emergency Department (HOSPITAL_COMMUNITY)
Admission: EM | Admit: 2023-08-21 | Discharge: 2023-08-21 | Disposition: A | Discharge status: Home / Self Care | Payer: 59 | Attending: Emergency Medicine | Admitting: Emergency Medicine

## 2023-08-21 DIAGNOSIS — R197 Diarrhea, unspecified: Secondary | ICD-10-CM | POA: Insufficient documentation

## 2023-08-21 DIAGNOSIS — R1013 Epigastric pain: Secondary | ICD-10-CM | POA: Insufficient documentation

## 2023-08-21 DIAGNOSIS — E119 Type 2 diabetes mellitus without complications: Secondary | ICD-10-CM | POA: Diagnosis not present

## 2023-08-21 DIAGNOSIS — Z7984 Long term (current) use of oral hypoglycemic drugs: Secondary | ICD-10-CM | POA: Insufficient documentation

## 2023-08-21 DIAGNOSIS — R112 Nausea with vomiting, unspecified: Secondary | ICD-10-CM | POA: Diagnosis present

## 2023-08-21 LAB — CBC
HCT: 45 % (ref 36.0–46.0)
Hemoglobin: 15.9 g/dL — ABNORMAL HIGH (ref 12.0–15.0)
MCH: 29.6 pg (ref 26.0–34.0)
MCHC: 35.3 g/dL (ref 30.0–36.0)
MCV: 83.6 fL (ref 80.0–100.0)
Platelets: 318 10*3/uL (ref 150–400)
RBC: 5.38 MIL/uL — ABNORMAL HIGH (ref 3.87–5.11)
RDW: 12.5 % (ref 11.5–15.5)
WBC: 6.8 10*3/uL (ref 4.0–10.5)
nRBC: 0 % (ref 0.0–0.2)

## 2023-08-21 LAB — URINALYSIS, ROUTINE W REFLEX MICROSCOPIC
Bacteria, UA: NONE SEEN
Bilirubin Urine: NEGATIVE
Glucose, UA: NEGATIVE mg/dL
Hgb urine dipstick: NEGATIVE
Ketones, ur: 20 mg/dL — AB
Nitrite: NEGATIVE
Protein, ur: 30 mg/dL — AB
Specific Gravity, Urine: 1.023 (ref 1.005–1.030)
pH: 8 (ref 5.0–8.0)

## 2023-08-21 LAB — COMPREHENSIVE METABOLIC PANEL
ALT: 17 U/L (ref 0–44)
AST: 13 U/L — ABNORMAL LOW (ref 15–41)
Albumin: 4.4 g/dL (ref 3.5–5.0)
Alkaline Phosphatase: 48 U/L (ref 38–126)
Anion gap: 11 (ref 5–15)
BUN: 11 mg/dL (ref 8–23)
CO2: 24 mmol/L (ref 22–32)
Calcium: 10 mg/dL (ref 8.9–10.3)
Chloride: 100 mmol/L (ref 98–111)
Creatinine, Ser: 0.64 mg/dL (ref 0.44–1.00)
GFR, Estimated: >60 mL/min (ref >60)
Glucose, Bld: 148 mg/dL — ABNORMAL HIGH (ref 70–99)
Potassium: 3.5 mmol/L (ref 3.5–5.1)
Sodium: 135 mmol/L (ref 135–145)
Total Bilirubin: 0.8 mg/dL (ref <1.2)
Total Protein: 7.4 g/dL (ref 6.5–8.1)

## 2023-08-21 LAB — TROPONIN I (HIGH SENSITIVITY): Troponin I (High Sensitivity): 3 ng/L (ref <18)

## 2023-08-21 LAB — LIPASE, BLOOD: Lipase: 32 U/L (ref 11–51)

## 2023-08-21 MED ORDER — ALUM & MAG HYDROXIDE-SIMETH 200-200-20 MG/5ML PO SUSP
30.0000 mL | Freq: Once | ORAL | Status: AC
  Administered 2023-08-21: 30 mL via ORAL
  Filled 2023-08-21: qty 30

## 2023-08-21 MED ORDER — PROMETHAZINE HCL 25 MG PO TABS
25.0000 mg | ORAL_TABLET | Freq: Four times a day (QID) | ORAL | 0 refills | Status: DC | PRN
Start: 2023-08-21 — End: 2024-05-07

## 2023-08-21 MED ORDER — ONDANSETRON 4 MG PO TBDP
4.0000 mg | ORAL_TABLET | Freq: Once | ORAL | Status: AC
  Administered 2023-08-21: 4 mg via ORAL
  Filled 2023-08-21: qty 1

## 2023-08-21 MED ORDER — ONDANSETRON 4 MG PO TBDP
4.0000 mg | ORAL_TABLET | Freq: Three times a day (TID) | ORAL | 0 refills | Status: DC | PRN
Start: 2023-08-21 — End: 2023-12-22

## 2023-08-21 MED ORDER — ONDANSETRON HCL 4 MG/2ML IJ SOLN
4.0000 mg | Freq: Once | INTRAMUSCULAR | Status: AC | PRN
Start: 2023-08-21 — End: 2023-08-21
  Administered 2023-08-21: 4 mg via INTRAVENOUS
  Filled 2023-08-21: qty 2

## 2023-08-21 NOTE — ED Notes (Signed)
Pt given 2 12 oz cups of ice water to help hydrate for a urine sample.

## 2023-08-21 NOTE — ED Provider Notes (Signed)
[place] EMERGENCY DEPARTMENT AT [facility] Provider Note   CSN: [id number] Arrival date & time: 08/21/23  6295     History  Chief Complaint  Patient presents with   Emesis   Diarrhea   Chest Pain    Molly Ryan is a 62 y.o. female.   Emesis Associated symptoms: diarrhea   Diarrhea Associated symptoms: vomiting   Chest Pain Associated symptoms: vomiting    This patient is a 62 year old female, she is on metformin and Mounjaro for diabetes, she has been on the [facility] for about 6 months and does have some nausea associated with that.  For the last day and a half she has developed watery diarrhea as well as 2 episodes of vomiting, this also started after she ate a leftover chicken salad croissant that was brought to her from a birthday party that she was not able to attend.  She reports that she has mild abdominal discomfort, mostly epigastric, no dysuria, no fevers or chills, she did have a little bit of chest discomfort which she attributes to acid reflux after she started vomiting.  She has had no medications prior to arrival.  She describes the diarrhea as watery, nonbloody, approximately 5 episodes this morning    Home Medications Prior to Admission medications   Medication Sig Start Date End Date Taking? Authorizing Provider  citalopram (CELEXA) 40 MG tablet Take 1 tablet (40 mg total) by mouth daily. 10/31/22  Yes Babs Sciara, MD  eszopiclone 3 MG TABS 1 nightly as needed 06/13/23  Yes Luking, Jonna Coup, MD  HYDROcodone-acetaminophen (NORCO/VICODIN) 5-325 MG tablet Take 1 tablet by mouth every 6 (six) hours as needed for moderate pain (pain score 4-6). 01/31/22  Yes [provider]  metFORMIN (GLUCOPHAGE) 500 MG tablet Take 1 tablet (500 mg total) by mouth 2 (two) times daily with a meal. 02/04/23  Yes Reardon, Freddi Starr, NP  ondansetron (ZOFRAN) 8 MG tablet Take 1 tablet (8 mg total) by mouth every 8 (eight) hours as needed for nausea or  vomiting. 06/13/23  Yes Luking, Scott A, MD  ondansetron (ZOFRAN-ODT) 4 MG disintegrating tablet Take 1 tablet (4 mg total) by mouth every 8 (eight) hours as needed for nausea. 08/21/23  Yes Eber Hong, MD  promethazine (PHENERGAN) 25 MG tablet Take 1 tablet (25 mg total) by mouth every 6 (six) hours as needed for nausea or vomiting. 08/21/23  Yes Eber Hong, MD  tirzepatide [facility]) 12.5 MG/0.5ML Pen Inject 12.5 mg into the skin once a week. 07/04/23  Yes Dani Gobble, NP  meclizine (ANTIVERT) 25 MG tablet Take one tablet every 6 hours prn dizziness. Caution drowiness 01/23/22   Babs Sciara, MD      Allergies    Percocet [oxycodone-acetaminophen], Morphine and codeine, and Amoxicillin    Review of Systems   Review of Systems  Cardiovascular:  Positive for chest pain.  Gastrointestinal:  Positive for diarrhea and vomiting.  All other systems reviewed and are negative.   Physical Exam Updated Vital Signs BP (!) 127/91   Pulse 78   Temp 97.9 F (36.6 C) (Oral)   Resp (!) 21   Ht 1.549 m (5\' 1" )   Wt 72.1 kg   SpO2 98%   BMI 30.04 kg/m  Physical Exam Vitals and nursing note reviewed.  Constitutional:      General: She is not in acute distress.    Appearance: She is well-developed.  HENT:     Head: Normocephalic and  atraumatic.     Mouth/Throat:     Pharynx: No oropharyngeal exudate.  Eyes:     General: No scleral icterus.       Right eye: No discharge.        Left eye: No discharge.     Conjunctiva/sclera: Conjunctivae normal.     Pupils: Pupils are equal, round, and reactive to light.  Neck:     Thyroid: No thyromegaly.     Vascular: No JVD.  Cardiovascular:     Rate and Rhythm: Normal rate and regular rhythm.     Heart sounds: Normal heart sounds. No murmur heard.    No friction rub. No gallop.  Pulmonary:     Effort: Pulmonary effort is normal. No respiratory distress.     Breath sounds: Normal breath sounds. No wheezing or rales.  Abdominal:      General: Bowel sounds are normal. There is no distension.     Palpations: Abdomen is soft. There is no mass.     Tenderness: There is abdominal tenderness.     Comments: Minimal epigastric tenderness, she states that is more nausea that is elicited with palpation in that area, otherwise the abdomen is completely soft  Musculoskeletal:        General: Tenderness present. Normal range of motion.     Cervical back: Normal range of motion and neck supple.     Right lower leg: No edema.     Left lower leg: No edema.     Comments: Bruising to the left anterior proximal shin  Lymphadenopathy:     Cervical: No cervical adenopathy.  Skin:    General: Skin is warm and dry.     Findings: No erythema or rash.  Neurological:     Mental Status: She is alert.     Coordination: Coordination normal.  Psychiatric:        Behavior: Behavior normal.     ED Results / Procedures / Treatments   Labs (all labs ordered are listed, but only abnormal results are displayed) Labs Reviewed  CBC - Abnormal; Notable for the following components:      Result Value   RBC 5.38 (*)    Hemoglobin 15.9 (*)    All other components within normal limits  COMPREHENSIVE METABOLIC PANEL - Abnormal; Notable for the following components:   Glucose, Bld 148 (*)    AST 13 (*)    All other components within normal limits  URINALYSIS, ROUTINE W REFLEX MICROSCOPIC - Abnormal; Notable for the following components:   Ketones, ur 20 (*)    Protein, ur 30 (*)    Leukocytes,Ua SMALL (*)    All other components within normal limits  LIPASE, BLOOD  TROPONIN I (HIGH SENSITIVITY)    EKG EKG Interpretation Date/Time:  Thursday August 21 2023 08:32:10 EST Ventricular Rate:  64 PR Interval:  132 QRS Duration:  98 QT Interval:  425 QTC Calculation: 439 R Axis:   -61  Text Interpretation: Sinus rhythm Left anterior fascicular block Low voltage, precordial leads Consider anterior infarct since last tracing no significant  change Confirmed by Eber Hong ([id number]) on 08/21/2023 8:40:43 AM  Radiology DG Chest 2 View  Result Date: 08/21/2023 CLINICAL DATA:  Chest pain. EXAM: CHEST - 2 VIEW COMPARISON:  Chest radiograph dated April 02, 2021. FINDINGS: The heart size and mediastinal contours are within normal limits. Both lungs are clear. No pneumothorax or pleural effusion. No acute osseous abnormality. IMPRESSION: No acute cardiopulmonary findings. Electronically Signed  By: Hart Robinsons M.D.   On: 08/21/2023 09:34    Procedures Procedures    Medications Ordered in ED Medications  ondansetron (ZOFRAN-ODT) disintegrating tablet 4 mg (4 mg Oral Given 08/21/23 0850)  ondansetron (ZOFRAN) injection 4 mg (4 mg Intravenous Given 08/21/23 0933)  alum & mag hydroxide-simeth (MAALOX/MYLANTA) 200-200-20 MG/5ML suspension 30 mL (30 mLs Oral Given 08/21/23 1113)  ondansetron (ZOFRAN-ODT) disintegrating tablet 4 mg (4 mg Oral Given 08/21/23 1114)    ED Course/ Medical Decision Making/ A&P                                 Medical Decision Making Amount and/or Complexity of Data Reviewed Labs: ordered. Radiology: ordered.  Risk OTC drugs. Prescription drug management.    This patient presents to the ED for concern of nausea vomiting and diarrhea, this involves an extensive number of treatment options, and is a complaint that carries with it a high risk of complications and morbidity.  The differential diagnosis includes food poisoning, viral syndrome, could be less likely to be acute hepatitis transaminitis pancreatitis or other acute bowel pathology but given her extremely soft and minimally tender abdomen I suspect it is either food poisoning or viral process   Co morbidities that complicate the patient evaluation  Diabetes, on Mounjaro   Additional history obtained:  Additional history obtained from medical record External records from outside source obtained and reviewed including  Office visits, followed  by endocrinology for diabetes most recently seen in September.   Lab Tests:  I Ordered, and personally interpreted labs.  The pertinent results include: Mild ketones in the urine, otherwise metabolic panel and CBC unremarkable    Cardiac Monitoring: / EKG:  The patient was maintained on a cardiac monitor.  I personally viewed and interpreted the cardiac monitored which showed an underlying rhythm of: Normal sinus rhythm    Problem List / ED Course / Critical interventions / Medication management  Patient is well-appearing, took oral fluids after antiemetics, doing well, urinalysis given, mild dehydration, no infection, patient updated, stable for discharge, suspect benign etiology either food poisoning or mild viral syndrome. I have reviewed the patients home medicines and have made adjustments as needed   Social Determinants of Health:  None   Test / Admission - Considered:  Appear safe for discharge home, understands indications for return         Final Clinical Impression(s) / ED Diagnoses Final diagnoses:  Nausea vomiting and diarrhea    Rx / DC Orders ED Discharge Orders          Ordered    ondansetron (ZOFRAN-ODT) 4 MG disintegrating tablet  Every 8 hours PRN        08/21/23 1409    promethazine (PHENERGAN) 25 MG tablet  Every 6 hours PRN        08/21/23 1409              Eber Hong, MD 08/21/23 1410

## 2023-08-21 NOTE — ED Notes (Signed)
Ua SENT TO LAB

## 2023-08-21 NOTE — ED Notes (Signed)
Pt reminded we needed a urine sample.

## 2023-08-21 NOTE — ED Triage Notes (Signed)
Pt c/o vomiting and diarrhea since yesterday . Pt has had 4 loose stool in 2 hrs this morning. Pt also c/o mid chest pain/soreness /pressure since 3 am. Pt verbalized she feels if she is having a lot of acid reflex .

## 2023-08-21 NOTE — Discharge Instructions (Signed)
Zofran or Phenergan as needed for nausea or vomiting, your testing here showed only mild dehydration, drink plenty of clear liquids, return to the ER for worsening symptoms otherwise see your doctor in 3 days for recheck  If diarrhea continues longer than 72 hours you may take some Imodium which is an over-the-counter diarrhea medicine

## 2023-08-21 NOTE — ED Notes (Signed)
Pt made aware we need a urine sample

## 2023-08-25 ENCOUNTER — Ambulatory Visit: Payer: 59 | Admitting: Family Medicine

## 2023-08-25 ENCOUNTER — Ambulatory Visit (HOSPITAL_COMMUNITY)
Admission: RE | Admit: 2023-08-25 | Discharge: 2023-08-25 | Disposition: A | Discharge status: Home / Self Care | Payer: 59 | Source: Ambulatory Visit | Attending: Family Medicine | Admitting: Family Medicine

## 2023-08-25 ENCOUNTER — Telehealth: Payer: Self-pay | Admitting: *Deleted

## 2023-08-25 ENCOUNTER — Ambulatory Visit: Payer: Self-pay | Admitting: Family Medicine

## 2023-08-25 ENCOUNTER — Other Ambulatory Visit (HOSPITAL_COMMUNITY)
Admission: RE | Admit: 2023-08-25 | Discharge: 2023-08-25 | Disposition: A | Discharge status: Home / Self Care | Payer: 59 | Source: Ambulatory Visit | Attending: Family Medicine | Admitting: Family Medicine

## 2023-08-25 ENCOUNTER — Other Ambulatory Visit: Payer: Self-pay | Admitting: Family Medicine

## 2023-08-25 VITALS — BP 124/86 | HR 91 | Temp 97.5°F | Ht 61.0 in | Wt 149.0 lb

## 2023-08-25 DIAGNOSIS — R1084 Generalized abdominal pain: Secondary | ICD-10-CM | POA: Insufficient documentation

## 2023-08-25 LAB — COMPREHENSIVE METABOLIC PANEL
ALT: 18 U/L (ref 0–44)
AST: 15 U/L (ref 15–41)
Albumin: 4.3 g/dL (ref 3.5–5.0)
Alkaline Phosphatase: 55 U/L (ref 38–126)
Anion gap: 14 (ref 5–15)
BUN: 16 mg/dL (ref 8–23)
CO2: 22 mmol/L (ref 22–32)
Calcium: 9.5 mg/dL (ref 8.9–10.3)
Chloride: 99 mmol/L (ref 98–111)
Creatinine, Ser: 0.87 mg/dL (ref 0.44–1.00)
GFR, Estimated: >60 mL/min (ref >60)
Glucose, Bld: 116 mg/dL — ABNORMAL HIGH (ref 70–99)
Potassium: 3.1 mmol/L — ABNORMAL LOW (ref 3.5–5.1)
Sodium: 135 mmol/L (ref 135–145)
Total Bilirubin: 1.1 mg/dL (ref <1.2)
Total Protein: 7.4 g/dL (ref 6.5–8.1)

## 2023-08-25 LAB — CBC
HCT: 48.1 % — ABNORMAL HIGH (ref 36.0–46.0)
Hemoglobin: 16.7 g/dL — ABNORMAL HIGH (ref 12.0–15.0)
MCH: 28.9 pg (ref 26.0–34.0)
MCHC: 34.7 g/dL (ref 30.0–36.0)
MCV: 83.4 fL (ref 80.0–100.0)
Platelets: 369 10*3/uL (ref 150–400)
RBC: 5.77 MIL/uL — ABNORMAL HIGH (ref 3.87–5.11)
RDW: 12.6 % (ref 11.5–15.5)
WBC: 10.1 10*3/uL (ref 4.0–10.5)
nRBC: 0 % (ref 0.0–0.2)

## 2023-08-25 LAB — LIPASE, BLOOD: Lipase: 27 U/L (ref 11–51)

## 2023-08-25 MED ORDER — POTASSIUM CHLORIDE CRYS ER 20 MEQ PO TBCR: 20.0000 meq | EXTENDED_RELEASE_TABLET | Freq: Two times a day (BID) | ORAL | 0 refills | Status: DC

## 2023-08-25 NOTE — Telephone Encounter (Signed)
Patient left a voicemail. She states that since last Tuesday she has had nausea,vomiting, and diarrhea. She went to the ED and was told that she had food postioning and dehydration She feels that she is having pancreatitis, and this is due to the [facility]. Called patient back, she is at her PCP now. They are ordering a stat CT to rule out bowel obstruction and lab work. They advised the patient not to take anymore Mounjaro. Her last dose was Saturday. He blood glucose in the ED was 155. She shared that this was high for her.  Patient was advised that Molly Ryan would be made aware, and that she should indeed not use the [facility]. We will watch out for results, any further recommendations we will call her back.

## 2023-08-25 NOTE — Patient Instructions (Signed)
We will call with results.   Labs and imaging at the hospital.

## 2023-08-25 NOTE — Progress Notes (Signed)
Subjective:  Patient ID: Molly Ryan, female    DOB: 13-Jul-1961  Age: 62 y.o. MRN: [id number]  CC: Abdominal pain, nausea, vomiting, diarrhea  HPI:  62 year old female presents for evaluation of the above.  Patient states that her symptoms started on Tuesday, 11/5.  She reports nausea, vomiting, and diarrhea.  She has had some incontinence due to the diarrhea.  She reports associated generalized abdominal pain.  She states that she was evaluated in the ER on 11/7.  Labs reviewed and were notable for hemoconcentration.  Normal lipase.  Normal renal function.  No elevated WBC count.  Urine microscopy revealed pyuria with no bacteria.  She was not having any urinary symptoms.  Per the ER note, she was tolerating fluids after given antiemetics.  She was discharged home on Zofran and Phenergan.  Patient reports that she continues to feel poorly.  Last bout of emesis was yesterday.  Diarrhea seems to have improved with use of Imodium.  Continues to have diffuse abdominal pain.  Associated fatigue as well.  No fever.  She is taking very little by mouth.  She states that she has recently eaten a cracker and has drank some water.  She is essentially not having much of any oral intake.  Patient Active Problem List   Diagnosis Date Noted   Generalized abdominal pain 08/25/2023   Hyperlipidemia associated with type 2 diabetes mellitus (HCC) 10/31/2022   Vitamin D deficiency 11/18/2016   Type 2 diabetes mellitus without complication, without long-term current use of insulin (HCC) 09/14/2015   Morbid obesity (HCC) 09/11/2015   Migraine headache with aura 07/15/2013   Chronic insomnia 07/15/2013   Depression with anxiety 07/15/2013    Social Hx   Social History   Socioeconomic History   Marital status: Married    Spouse name: Not on file   Number of children: Not on file   Years of education: Not on file   Highest education level: Not on file  Occupational History   Not on file  Tobacco  Use   Smoking status: Never   Smokeless tobacco: Never  Substance and Sexual Activity   Alcohol use: Yes    Comment: occassionally   Drug use: No   Sexual activity: Yes  Other Topics Concern   Not on file  Social History Narrative   Not on file   Social Determinants of Health   Financial Resource Strain: Not on file  Food Insecurity: Not on file  Transportation Needs: Not on file  Physical Activity: Not on file  Stress: Not on file  Social Connections: Not on file    Review of Systems Per HPI  Objective:  BP 124/86   Pulse 91   Temp (!) 97.5 F (36.4 C)   Ht 5\' 1"  (1.549 m)   Wt 149 lb (67.6 kg)   SpO2 99%   BMI 28.15 kg/m      08/25/2023   10:47 AM 08/25/2023   10:02 AM 08/21/2023   12:00 PM  BP/Weight  Systolic BP 124 137 127  Diastolic BP 86 94 91  Wt. (Lbs)  149   BMI  28.15 kg/m2     Physical Exam Vitals and nursing note reviewed.  Constitutional:      General: She is not in acute distress. HENT:     Head: Normocephalic and atraumatic.  Eyes:     General:        Right eye: No discharge.        Left  eye: No discharge.     Conjunctiva/sclera: Conjunctivae normal.  Cardiovascular:     Rate and Rhythm: Normal rate and regular rhythm.  Pulmonary:     Effort: Pulmonary effort is normal.     Breath sounds: Normal breath sounds. No wheezing, rhonchi or rales.  Abdominal:     Comments: Soft, diffusely tender to palpation.  Neurological:     Mental Status: She is alert.  Psychiatric:        Mood and Affect: Mood normal.        Behavior: Behavior normal.     Lab Results  Component Value Date   WBC 6.8 08/21/2023   HGB 15.9 (H) 08/21/2023   HCT 45.0 08/21/2023   PLT 318 08/21/2023   GLUCOSE 148 (H) 08/21/2023   CHOL 130 06/24/2023   TRIG 102 06/24/2023   HDL 61 06/24/2023   LDLCALC 50 06/24/2023   ALT 17 08/21/2023   AST 13 (L) 08/21/2023   NA 135 08/21/2023   K 3.5 08/21/2023   CL 100 08/21/2023   CREATININE 0.64 08/21/2023   BUN 11  08/21/2023   CO2 24 08/21/2023   TSH 0.911 03/28/2018   HGBA1C 6.3 (H) 06/24/2023     Assessment & Plan:   Problem List Items Addressed This Visit       Other   Generalized abdominal pain - Primary    ER thought that this was a viral process.  She continues to be symptomatic and has not improved.  Obtaining stat labs today and a CT scan of the abdomen and pelvis for further evaluation.      Relevant Orders   CT ABDOMEN PELVIS WO CONTRAST   CBC   Comprehensive metabolic panel   Lipase    Everlene Other DO [facility]

## 2023-08-25 NOTE — Telephone Encounter (Signed)
Noted  

## 2023-08-25 NOTE — Telephone Encounter (Signed)
Ok, thanks for the heads up.  It does seem to line up with her recent Mounjaro increase.   I agree with stopping it.

## 2023-08-25 NOTE — Telephone Encounter (Signed)
Reason for Disposition . [1] Vomiting AND [2] contains bile (green color)  Answer Assessment - Initial Assessment Questions 1. LOCATION: "Where does it hurt?"     Entire stomach 2. RADIATION: "Does the pain shoot anywhere else?" (e.g., chest, back)     No 3. ONSET: "When did the pain begin?" (e.g., minutes, hours or days ago)      1 week, went to ED and still has no relief 4. SUDDEN: "Gradual or sudden onset?"     Suddent 5. PATTERN "Does the pain come and go, or is it constant?"    - If it comes and goes: "How long does it last?" "Do you have pain now?"     (Note: Comes and goes means the pain is intermittent. It goes away completely between bouts.)    - If constant: "Is it getting better, staying the same, or getting worse?"      (Note: Constant means the pain never goes away completely; most serious pain is constant and gets worse.)      Constant pain  6. SEVERITY: "How bad is the pain?"  (e.g., Scale 1-10; mild, moderate, or severe)    - MILD (1-3): Doesn't interfere with normal activities, abdomen soft and not tender to touch.     - MODERATE (4-7): Interferes with normal activities or awakens from sleep, abdomen tender to touch.     - SEVERE (8-10): Excruciating pain, doubled over, unable to do any normal activities.       7 7. RECURRENT SYMPTOM: "Have you ever had this type of stomach pain before?" If Yes, ask: "When was the last time?" and "What happened that time?"      No 8. CAUSE: "What do you think is causing the stomach pain?"     Mounjaro injection 1 month ago 9. RELIEVING/AGGRAVATING FACTORS: "What makes it better or worse?" (e.g., antacids, bending or twisting motion, bowel movement)     Food makes it worse; able to tolerate sips of fluid 10. OTHER SYMPTOMS: "Do you have any other symptoms?" (e.g., back pain, diarrhea, fever, urination pain, vomiting)       Nausea, vomiting, and diarrhea 11. PREGNANCY: "Is there any chance you are pregnant?" "When was your last menstrual  period?"       No  Protocols used: Abdominal Pain - [facility]

## 2023-08-25 NOTE — Telephone Encounter (Signed)
Copied from CRM [id number]. Topic: Clinical - Medical Advice >> Aug 25, 2023  9:22 AM Herbert Seta B wrote: Reason for CRM: Patient vomiting and diarrhea and reflux times 1 week   Call transferred from agent Heather. Call disconnected before speaking with patient

## 2023-08-25 NOTE — Assessment & Plan Note (Signed)
ER thought that this was a viral process.  She continues to be symptomatic and has not improved.  Obtaining stat labs today and a CT scan of the abdomen and pelvis for further evaluation.

## 2023-08-28 ENCOUNTER — Ambulatory Visit: Payer: 59 | Admitting: Family Medicine

## 2023-08-28 VITALS — BP 120/78 | HR 65 | Temp 97.9°F | Ht 61.0 in | Wt 153.2 lb

## 2023-08-28 DIAGNOSIS — R1084 Generalized abdominal pain: Secondary | ICD-10-CM

## 2023-08-28 NOTE — Progress Notes (Signed)
Subjective:  Patient ID: Molly Ryan, female    DOB: [birth year]  Age: 62 y.o. MRN: [id number]  CC: Follow-up   HPI:  62 year old female presents for follow-up regarding recent nausea, vomiting, diarrhea, abdominal pain.  Patient's CAT scan was negative.  Labs revealed mild hypokalemia and hemoconcentration.  Patient is now improved and doing much better.  She has had no additional vomiting.  Tolerating p.o. intake.  She is happy to be able to eat and drink without difficulty.  Will discuss restarting Mounjaro.  Patient Active Problem List   Diagnosis Date Noted   Generalized abdominal pain 08/25/2023   Hyperlipidemia associated with type 2 diabetes mellitus (HCC) 10/31/2022   Vitamin D deficiency 11/18/2016   Type 2 diabetes mellitus without complication, without long-term current use of insulin (HCC) 09/14/2015   Morbid obesity (HCC) 09/11/2015   Migraine headache with aura 07/15/2013   Chronic insomnia 07/15/2013   Depression with anxiety 07/15/2013    Social Hx   Social History   Socioeconomic History   Marital status: Married    Spouse name: Not on file   Number of children: Not on file   Years of education: Not on file   Highest education level: Not on file  Occupational History   Not on file  Tobacco Use   Smoking status: Never   Smokeless tobacco: Never  Substance and Sexual Activity   Alcohol use: Yes    Comment: occassionally   Drug use: No   Sexual activity: Yes  Other Topics Concern   Not on file  Social History Narrative   Not on file   Social Determinants of Health   Financial Resource Strain: Not on file  Food Insecurity: Not on file  Transportation Needs: Not on file  Physical Activity: Not on file  Stress: Not on file  Social Connections: Not on file    Review of Systems Per HPI  Objective:  BP 120/78   Pulse 65   Temp 97.9 F (36.6 C)   Ht 5\' 1"  (1.549 m)   Wt 153 lb 3.2 oz (69.5 kg)   SpO2 95%   BMI 28.95 kg/m       08/28/2023    4:11 PM 08/25/2023   10:47 AM 08/25/2023   10:02 AM  BP/Weight  Systolic BP 120 124 137  Diastolic BP 78 86 94  Wt. (Lbs) 153.2  149  BMI 28.95 kg/m2  28.15 kg/m2    Physical Exam Vitals and nursing note reviewed.  Constitutional:      General: She is not in acute distress.    Appearance: Normal appearance.  HENT:     Head: Normocephalic and atraumatic.  Cardiovascular:     Rate and Rhythm: Normal rate and regular rhythm.  Pulmonary:     Effort: Pulmonary effort is normal.     Breath sounds: Normal breath sounds.  Abdominal:     General: There is no distension.     Palpations: Abdomen is soft.     Tenderness: There is no abdominal tenderness.  Neurological:     Mental Status: She is alert.     Lab Results  Component Value Date   WBC 10.1 08/25/2023   HGB 16.7 (H) 08/25/2023   HCT 48.1 (H) 08/25/2023   PLT 369 08/25/2023   GLUCOSE 116 (H) 08/25/2023   CHOL 130 06/24/2023   TRIG 102 06/24/2023   HDL 61 06/24/2023   LDLCALC 50 06/24/2023   ALT 18 08/25/2023   AST 15 08/25/2023  NA 135 08/25/2023   K 3.1 (L) 08/25/2023   CL 99 08/25/2023   CREATININE 0.87 08/25/2023   BUN 16 08/25/2023   CO2 22 08/25/2023   TSH 0.911 03/28/2018   HGBA1C 6.3 (H) 06/24/2023     Assessment & Plan:   Problem List Items Addressed This Visit       Other   Generalized abdominal pain - Primary    Abdominal pain with associated nausea, vomiting, diarrhea.  Now resolved.  She is doing well at this time.  I advised her to reach out to endocrinology regarding her Mounjaro.  I recommended going back down to the 10 mg dose.      Everlene Other DO [facility]

## 2023-08-28 NOTE — Assessment & Plan Note (Signed)
Abdominal pain with associated nausea, vomiting, diarrhea.  Now resolved.  She is doing well at this time.  I advised her to reach out to endocrinology regarding her Mounjaro.  I recommended going back down to the 10 mg dose.

## 2023-08-28 NOTE — Patient Instructions (Signed)
I'm glad your doing well.  Reach out to [place].  I would recommend going back down on the [facility].

## 2023-09-02 ENCOUNTER — Telehealth: Payer: Self-pay | Admitting: *Deleted

## 2023-09-02 NOTE — Telephone Encounter (Signed)
Looks like pancreatitis was ruled out.  I think we can restart the [facility] but at lower dose of 10 mg to make sure she tolerates it ok.  We can send in a prescription for that dose if she needs [place] to.

## 2023-09-02 NOTE — Telephone Encounter (Signed)
Patient was called and a message was left with this information.

## 2023-09-02 NOTE — Telephone Encounter (Signed)
Patient left a message asking of she should stay on the Mounjaro 12.5 mg or go back to 10 mg. Has appointment with Dr. Adriana Simas ans would like to know or hear from [place].

## 2023-11-07 ENCOUNTER — Encounter: Payer: Self-pay | Admitting: Nurse Practitioner

## 2023-11-07 ENCOUNTER — Ambulatory Visit: Payer: 59 | Admitting: Nurse Practitioner

## 2023-11-07 VITALS — BP 117/73 | HR 69 | Ht 60.0 in | Wt 145.4 lb

## 2023-11-07 DIAGNOSIS — E782 Mixed hyperlipidemia: Secondary | ICD-10-CM | POA: Diagnosis not present

## 2023-11-07 DIAGNOSIS — Z7985 Long-term (current) use of injectable non-insulin antidiabetic drugs: Secondary | ICD-10-CM | POA: Diagnosis not present

## 2023-11-07 DIAGNOSIS — E119 Type 2 diabetes mellitus without complications: Secondary | ICD-10-CM | POA: Diagnosis not present

## 2023-11-07 DIAGNOSIS — Z7984 Long term (current) use of oral hypoglycemic drugs: Secondary | ICD-10-CM | POA: Diagnosis not present

## 2023-11-07 LAB — POCT GLYCOSYLATED HEMOGLOBIN (HGB A1C): Hemoglobin A1C: 5.9 % — AB (ref 4.0–5.6)

## 2023-11-07 NOTE — Progress Notes (Signed)
Endocrinology Follow Up Note       11/07/2023, 8:46 AM   Subjective:    Patient ID: Molly Ryan, female    DOB: [birth year]  EVALIN SHAWHAN is being seen in follow up after being seen in consultation for management of currently uncontrolled symptomatic diabetes requested by  Babs Sciara, MD.   Past Medical History:  Diagnosis Date   Chronic insomnia    Migraine headache    Reflux     Past Surgical History:  Procedure Laterality Date   APPENDECTOMY     CHOLECYSTECTOMY     LAPAROSCOPIC BILATERAL SALPINGO OOPHERECTOMY Bilateral 2014   VAGINAL HYSTERECTOMY  2006    Social History   Socioeconomic History   Marital status: Married    Spouse name: Not on file   Number of children: Not on file   Years of education: Not on file   Highest education level: Not on file  Occupational History   Not on file  Tobacco Use   Smoking status: Never   Smokeless tobacco: Never  Substance and Sexual Activity   Alcohol use: Yes    Comment: occassionally   Drug use: No   Sexual activity: Yes  Other Topics Concern   Not on file  Social History Narrative   Not on file   Social Drivers of Health   Financial Resource Strain: Not on file  Food Insecurity: Not on file  Transportation Needs: Not on file  Physical Activity: Not on file  Stress: Not on file  Social Connections: Not on file    Family History  Problem Relation Age of Onset   Hypertension Brother    Cancer Brother        lung   Cancer Mother        lung   COPD Mother    Diabetes Maternal Grandmother     Outpatient Encounter Medications as of 11/07/2023  Medication Sig   citalopram (CELEXA) 40 MG tablet Take 1 tablet (40 mg total) by mouth daily.   eszopiclone 3 MG TABS 1 nightly as needed   HYDROcodone-acetaminophen (NORCO/VICODIN) 5-325 MG tablet Take 1 tablet by mouth every 6 (six) hours as needed for moderate pain (pain score  4-6).   meclizine (ANTIVERT) 25 MG tablet Take one tablet every 6 hours prn dizziness. Caution drowiness   metFORMIN (GLUCOPHAGE) 500 MG tablet Take 1 tablet (500 mg total) by mouth 2 (two) times daily with a meal.   ondansetron (ZOFRAN) 8 MG tablet Take 1 tablet (8 mg total) by mouth every 8 (eight) hours as needed for nausea or vomiting.   ondansetron (ZOFRAN-ODT) 4 MG disintegrating tablet Take 1 tablet (4 mg total) by mouth every 8 (eight) hours as needed for nausea.   promethazine (PHENERGAN) 25 MG tablet Take 1 tablet (25 mg total) by mouth every 6 (six) hours as needed for nausea or vomiting.   tirzepatide (MOUNJARO) 12.5 MG/0.5ML Pen Inject 12.5 mg into the skin once a week.   potassium chloride SA (KLOR-CON M) 20 MEQ tablet Take 1 tablet (20 mEq total) by mouth 2 (two) times daily for 3 days.   No facility-administered encounter medications on file as of 11/07/2023.  ALLERGIES: Allergies  Allergen Reactions   Percocet [Oxycodone-Acetaminophen] Anaphylaxis   Morphine And Codeine     Muscle spasms   Ozempic (0.25 Or 0.5 Mg-Dose) [Semaglutide(0.25 Or 0.5mg -Dos)]     Vision changes   Amoxicillin Rash    VACCINATION STATUS: Immunization History  Administered Date(s) Administered   Influenza,inj,Quad PF,6+ Mos 09/11/2015, 11/15/2016, 08/22/2017, 06/30/2020   Influenza-Unspecified 08/01/2018, 07/29/2019, 08/05/2022   PFIZER(Purple Top)SARS-COV-2 Vaccination 10/25/2019, 11/25/2019   PNEUMOCOCCAL CONJUGATE-20 08/20/2021   Pneumococcal Polysaccharide-23 11/15/2016    Diabetes She presents for her follow-up diabetic visit. She has type 2 diabetes mellitus. Onset time: diagnosed at approx age of 63. Her disease course has been stable. There are no hypoglycemic associated symptoms. There are no hypoglycemic complications. There are no diabetic complications. Risk factors for coronary artery disease include diabetes mellitus, dyslipidemia, obesity, sedentary lifestyle and  post-menopausal. Current diabetic treatment includes oral agent (monotherapy) (Mounjaro). She is compliant with treatment most of the time. Her weight is decreasing steadily. She is following a generally healthy diet. When asked about meal planning, she reported none. She has not had a previous visit with a dietitian. She participates in exercise intermittently. Her breakfast blood glucose range is generally 90-110 mg/dl. (She presents today with her logs showing at goal glycemic profile.  Her POCT A1c today is 5.9%, improving from previous visit of 6.3%.  She did have some abdominal pain between visits, therefore her Greggory Keen was reduced back to 10 mg once pancreatitis was ruled out.  She notes she thinks she had the Norovirus and that was the cause of her symptoms at that time.) An ACE inhibitor/angiotensin II receptor blocker is not being taken. She does not see a podiatrist.Eye exam is current.     Review of systems  Constitutional: + steadily decreasing body weight, current Body mass index is 28.4 kg/m., no fatigue, no subjective hyperthermia, no subjective hypothermia Eyes: no blurry vision, no xerophthalmia ENT: no sore throat, no nodules palpated in throat, no dysphagia/odynophagia, no hoarseness Cardiovascular: no chest pain, no shortness of breath, no palpitations, no leg swelling Respiratory: no cough, no shortness of breath Gastrointestinal: no nausea/vomiting/diarrhea Musculoskeletal: no muscle/joint aches Skin: no rashes, no hyperemia Neurological: no tremors, no numbness, no tingling, no dizziness Psychiatric: no depression, no anxiety  Objective:     BP 117/73 (BP Location: Left Arm, Patient Position: Sitting, Cuff Size: Large)   Pulse 69   Ht 5' (1.524 m)   Wt 145 lb 6.4 oz (66 kg)   BMI 28.40 kg/m   Wt Readings from Last 3 Encounters:  11/07/23 145 lb 6.4 oz (66 kg)  08/28/23 153 lb 3.2 oz (69.5 kg)  08/25/23 149 lb (67.6 kg)     BP Readings from Last 3 Encounters:   11/07/23 117/73  08/28/23 120/78  08/25/23 124/86      Physical Exam- Limited  Constitutional:  Body mass index is 28.4 kg/m. , not in acute distress, normal state of mind Eyes:  EOMI, no exophthalmos Musculoskeletal: no gross deformities, strength intact in all four extremities, no gross restriction of joint movements Skin:  no rashes, no hyperemia Neurological: no tremor with outstretched hands   Diabetic Foot Exam - Simple   No data filed     CMP ( most recent) CMP     Component Value Date/Time   NA 135 08/25/2023 1338   NA 139 06/24/2023 0741   K 3.1 (L) 08/25/2023 1338   CL 99 08/25/2023 1338   CO2 22 08/25/2023 1338  GLUCOSE 116 (H) 08/25/2023 1338   BUN 16 08/25/2023 1338   BUN 14 06/24/2023 0741   CREATININE 0.87 08/25/2023 1338   CALCIUM 9.5 08/25/2023 1338   PROT 7.4 08/25/2023 1338   PROT 7.2 06/24/2023 0741   ALBUMIN 4.3 08/25/2023 1338   ALBUMIN 4.6 06/24/2023 0741   AST 15 08/25/2023 1338   ALT 18 08/25/2023 1338   ALKPHOS 55 08/25/2023 1338   BILITOT 1.1 08/25/2023 1338   BILITOT 0.5 06/24/2023 0741   GFRNONAA >60 08/25/2023 1338   GFRAA 113 06/16/2020 0754     Diabetic Labs (most recent): Lab Results  Component Value Date   HGBA1C 5.9 (A) 11/07/2023   HGBA1C 6.3 (H) 06/24/2023   HGBA1C 7.4 (A) 01/31/2023     Lipid Panel ( most recent) Lipid Panel     Component Value Date/Time   CHOL 130 06/24/2023 0741   TRIG 102 06/24/2023 0741   HDL 61 06/24/2023 0741   CHOLHDL 2.1 06/24/2023 0741   CHOLHDL 2.9 01/21/2014 0945   VLDL 23 01/21/2014 0945   LDLCALC 50 06/24/2023 0741   LABVLDL 19 06/24/2023 0741      Lab Results  Component Value Date   TSH 0.911 03/28/2018   TSH 1.270 11/15/2016   TSH 1.073 01/21/2014           Assessment & Plan:   1) Type 2 diabetes mellitus without complication, without long-term current use of insulin  She presents today with her logs showing at goal glycemic profile.  Her POCT A1c today is  5.9%, improving from previous visit of 6.3%.  She did have some abdominal pain between visits, therefore her Greggory Keen was reduced back to 10 mg once pancreatitis was ruled out.  She notes she thinks she had the Norovirus and that was the cause of her symptoms at that time.  - CARRIGAN DELAFUENTE has currently uncontrolled symptomatic type 2 DM since 63 years of age.   -Recent labs reviewed.  - I had a long discussion with her about the progressive nature of diabetes and the pathology behind its complications. -her diabetes is not currently complicated but she remains at a high risk for more acute and chronic complications which include CAD, CVA, CKD, retinopathy, and neuropathy. These are all discussed in detail with her.  The following Lifestyle Medicine recommendations according to American College of Lifestyle Medicine [facility]) were discussed and offered to patient and she agrees to start the journey:  A. Whole Foods, Plant-based plate comprising of fruits and vegetables, plant-based proteins, whole-grain carbohydrates was discussed in detail with the patient.   A list for source of those nutrients were also provided to the patient.  Patient will use only water or unsweetened tea for hydration. B.  The need to stay away from risky substances including alcohol, smoking; obtaining 7 to 9 hours of restorative sleep, at least 150 minutes of moderate intensity exercise weekly, the importance of healthy social connections,  and stress reduction techniques were discussed. C.  A full color page of  Calorie density of various food groups per pound showing examples of each food groups was provided to the patient.  - Nutritional counseling repeated at each appointment due to patients tendency to fall back in to old habits.  - The patient admits there is a room for improvement in their diet and drink choices. -  Suggestion is made for the patient to avoid simple carbohydrates from their diet including Cakes,  Sweet Desserts / Pastries, Ice Cream, Soda (  diet and regular), Sweet Tea, Candies, Chips, Cookies, Sweet Pastries, Store Bought Juices, Alcohol in Excess of 1-2 drinks a day, Artificial Sweeteners, Coffee Creamer, and "Sugar-free" Products. This will help patient to have stable blood glucose profile and potentially avoid unintended weight gain.   - I encouraged the patient to switch to unprocessed or minimally processed complex starch and increased protein intake (animal or plant source), fruits, and vegetables.   - Patient is advised to stick to a routine mealtimes to eat 3 meals a day and avoid unnecessary snacks (to snack only to correct hypoglycemia).  - I have approached her with the following individualized plan to manage her diabetes and patient agrees:   -She is advised to continue her Metformin 500 mg once daily after breakfast (to prevent GI upset)- she reduced this due to constipation issues and continue her Mounjaro 10 mg SQ weekly.   -she is encouraged to continue monitoring glucose once daily 1-2 times per week, before breakfast, and to call the clinic if she had readings less than 70 or above 300 for 3 tests in a row.  - Adjustment parameters are given to her for hypo and hyperglycemia in writing.  - Specific targets for  A1c; LDL, HDL, and Triglycerides were discussed with the patient.  2) Blood Pressure /Hypertension:  her blood pressure is controlled to target without any antihypertensive medications.    3) Lipids/Hyperlipidemia:    Review of her recent lipid panel from 06/24/23 showed controlled LDL at 50, improving greatly.  she is advised to continue Crestor 20 mg daily at bedtime.  Side effects and precautions discussed with her.  4)  Weight/Diet:  her Body mass index is 28.4 kg/m.  -  clearly complicating her diabetes care.   she is a candidate for weight loss. I discussed with her the fact that loss of 5 - 10% of her  current body weight will have the most impact on her  diabetes management.  Exercise, and detailed carbohydrates information provided  -  detailed on discharge instructions.  5) Chronic Care/Health Maintenance: -she is not on ACEI/ARB and is on Statin medications and is encouraged to initiate and continue to follow up with Ophthalmology, Dentist, Podiatrist at least yearly or according to recommendations, and advised to stay away from smoking. I have recommended yearly flu vaccine and pneumonia vaccine at least every 5 years; moderate intensity exercise for up to 150 minutes weekly; and sleep for at least 7 hours a day.  - she is advised to maintain close follow up with Babs Sciara, MD for primary care needs, as well as her other providers for optimal and coordinated care.     I spent  32  minutes in the care of the patient today including review of labs from CMP, Lipids, Thyroid Function, Hematology (current and previous including abstractions from other facilities); face-to-face time discussing  her blood glucose readings/logs, discussing hypoglycemia and hyperglycemia episodes and symptoms, medications doses, her options of short and long term treatment based on the latest standards of care / guidelines;  discussion about incorporating lifestyle medicine;  and documenting the encounter. Risk reduction counseling performed per USPSTF guidelines to reduce obesity and cardiovascular risk factors.     Please refer to Patient Instructions for Blood Glucose Monitoring and Insulin/Medications Dosing Guide"  in media tab for additional information. Please  also refer to " Patient Self Inventory" in the Media  tab for reviewed elements of pertinent patient history.  Manuella Ghazi participated in  the discussions, expressed understanding, and voiced agreement with the above plans.  All questions were answered to her satisfaction. she is encouraged to contact clinic should she have any questions or concerns prior to her return visit.     Follow up  plan: - Return in about 6 months (around 05/06/2024) for Diabetes F/U with A1c in office, No previsit labs.   Ronny Bacon, [facility] [address] [place], [place] [address] Phone: [phone] Fax: [phone]  11/07/2023, 8:46 AM

## 2023-11-13 ENCOUNTER — Encounter: Payer: Self-pay | Admitting: Physician Assistant

## 2023-11-13 ENCOUNTER — Ambulatory Visit: Payer: 59 | Admitting: Physician Assistant

## 2023-11-13 ENCOUNTER — Ambulatory Visit: Payer: Self-pay | Admitting: Family Medicine

## 2023-11-13 VITALS — BP 152/98 | HR 78 | Temp 98.1°F | Ht 60.0 in | Wt 145.0 lb

## 2023-11-13 DIAGNOSIS — R051 Acute cough: Secondary | ICD-10-CM | POA: Diagnosis not present

## 2023-11-13 DIAGNOSIS — J019 Acute sinusitis, unspecified: Secondary | ICD-10-CM

## 2023-11-13 MED ORDER — DOXYCYCLINE HYCLATE 100 MG PO TABS: 100.0000 mg | ORAL_TABLET | Freq: Two times a day (BID) | ORAL | 0 refills | Status: AC

## 2023-11-13 MED ORDER — PROMETHAZINE-DM 6.25-15 MG/5ML PO SYRP: 5.0000 mL | ORAL_SOLUTION | Freq: Four times a day (QID) | ORAL | 0 refills | Status: DC | PRN

## 2023-11-13 MED ORDER — BENZONATATE 100 MG PO CAPS: 100.0000 mg | ORAL_CAPSULE | Freq: Three times a day (TID) | ORAL | 0 refills | Status: DC | PRN

## 2023-11-13 NOTE — Progress Notes (Signed)
Acute Office Visit  Subjective:     Patient ID: Molly Ryan, female    DOB: 1961-03-04, 63 y.o.   MRN: [id number]   HPI Patient is in today for cough and congestion. Patient states symptoms began last week Tuesday with sore throat and cough but over the last week has turned into severe congestion and worsening cough. Patient denies fevers, chest pain, or SOB. She admits left ear pain and headache as well. She states she has taken pseudafed, alka sletzer, advil, and tylenol OTC with little relief.   Review of Systems  Constitutional:  Positive for malaise/fatigue. Negative for chills, diaphoresis and fever.  HENT:  Positive for congestion, ear pain, sinus pain and sore throat.   Respiratory:  Positive for cough. Negative for shortness of breath and wheezing.   Cardiovascular:  Negative for chest pain and palpitations.  Gastrointestinal:  Positive for vomiting (post-tussive). Negative for nausea.  Musculoskeletal:  Negative for myalgias.  Neurological:  Positive for headaches.        Objective:     BP (!) 152/98   Pulse 78   Temp 98.1 F (36.7 C)   Ht 5' (1.524 m)   Wt 145 lb (65.8 kg)   SpO2 98%   BMI 28.32 kg/m   Physical Exam Vitals reviewed.  Constitutional:      General: She is not in acute distress.    Appearance: Normal appearance.  HENT:     Right Ear: Tympanic membrane normal.     Left Ear: Tympanic membrane normal.     Nose: Congestion present.     Right Sinus: Maxillary sinus tenderness present.     Left Sinus: Maxillary sinus tenderness present.     Mouth/Throat:     Mouth: Mucous membranes are moist.     Pharynx: Oropharynx is clear.  Eyes:     Extraocular Movements: Extraocular movements intact.     Conjunctiva/sclera: Conjunctivae normal.  Cardiovascular:     Rate and Rhythm: Normal rate and regular rhythm.     Heart sounds: No murmur heard.    No friction rub. No gallop.  Pulmonary:     Effort: Pulmonary effort is normal.     Breath  sounds: No stridor. Wheezing present. No rhonchi or rales.  Musculoskeletal:        General: Normal range of motion.  Lymphadenopathy:     Cervical: No cervical adenopathy.  Skin:    General: Skin is warm and dry.     Capillary Refill: Capillary refill takes less than 2 seconds.  Neurological:     General: No focal deficit present.     Mental Status: She is alert and oriented to person, place, and time.  Psychiatric:        Mood and Affect: Mood normal.        Behavior: Behavior normal.     No results found for any visits on 11/13/23.      Assessment & Plan:  Acute rhinosinusitis -     Doxycycline Hyclate; Take 1 tablet (100 mg total) by mouth 2 (two) times daily for 7 days.  Dispense: 14 tablet; Refill: 0  Acute cough -     Promethazine-DM; Take 5 mLs by mouth 4 (four) times daily as needed.  Dispense: 118 mL; Refill: 0 -     Benzonatate; Take 1 capsule (100 mg total) by mouth 3 (three) times daily as needed for cough.  Dispense: 20 capsule; Refill: 0   Presentation was consistent with sinusitis.  No evidence of other bacterial infections including pneumonia, pharyngitis, otitis media, or orbital cellulitis. Discussed that this fits the picture of viral vs bacterial sinusitis and that due to type and duration of symptoms and exam findings, we will treat as bacterial sinusitis.  Antibiotics prescribed. Promethazine DM and Tessalon Perles for cough. Advised to continue ibuprofen and Tylenol at home. I offered patient the option of chest x-ray due to duration of cough, however she declined at this time. I advised if symptoms persist or do not improve on antibiotics we should move forward with chest X-ray at that time. The patient was instructed to return if the worsens in any way, especially if not tolerating fluids, increased sinus pain or swelling, worsening headache, persistent fever, difficulty swallowing or breathing, or as needed. The patient agreed with the plan.    No  follow-ups on file.  Toni Amend Moira Umholtz, PA-C

## 2023-11-13 NOTE — Telephone Encounter (Signed)
  Chief Complaint: Cough Symptoms: Chest/nasal congestion Frequency: Ongoing since last Tuesday Pertinent Negatives: Patient denies nausea, fever Disposition: [] ED /[] Urgent Care (no appt availability in office) / [x] Appointment(In office/virtual)/ []  [place] [facility]/ [] Home Care/ [] Refused Recommended Disposition /[] [place] [facility]/ []  Follow-up with PCP Additional Notes: Pt reports she has had ongoing productive cough with clear sputum, chest/nasal congestion, dizziness, earache since last Tuesday. Pt denies fever but reports she has not been checking it. She notes a nurse at work listened to her lungs this AM and encouraged her to call for appt as she hears abnormal sounds in pt's left lobe. OV scheduled today. This RN educated pt on home care, new-worsening symptoms, when to call back/seek emergent care. Pt verbalized understanding and agrees to plan.  Copied from CRM [id number]. Topic: Clinical - Red Word Triage >> Nov 13, 2023 10:48 AM Ivette P wrote: [facility] that prompted transfer to Nurse Triage:  respitary issue, - wants to come in. moved into chest and ears are hurting to the point where losing balance. dizzines had it over a week. didnt want to come into the doctors office. eating cough drops to the point mouth is raw. has a clear Phlegm. Reason for Disposition  Earache  Answer Assessment - Initial Assessment Questions 1. ONSET: "When did the cough begin?"      Tuesday of last week 3. SPUTUM: "Describe the color of your sputum" (none, dry cough; clear, white, yellow, green)     Clear 4. HEMOPTYSIS: "Are you coughing up any blood?" If so ask: "How much?" (flecks, streaks, tablespoons, etc.)     None 5. DIFFICULTY BREATHING: "Are you having difficulty breathing?" If Yes, ask: "How bad is it?" (e.g., mild, moderate, severe)    - MILD: No SOB at rest, mild SOB with walking, speaks normally in sentences, can lie down, no retractions, pulse < 100.    - MODERATE: SOB at rest,  SOB with minimal exertion and prefers to sit, cannot lie down flat, speaks in phrases, mild retractions, audible wheezing, pulse 100-120.    - SEVERE: Very SOB at rest, speaks in single words, struggling to breathe, sitting hunched forward, retractions, pulse > 120      With coughing 6. FEVER: "Do you have a fever?" If Yes, ask: "What is your temperature, how was it measured, and when did it start?"     I don't know 10. OTHER SYMPTOMS: "Do you have any other symptoms?" (e.g., runny nose, wheezing, chest pain)       Nasal, chest congestion, earache  Protocols used: Cough - Acute Productive-A-AH

## 2023-11-17 ENCOUNTER — Telehealth: Payer: Self-pay | Admitting: Family Medicine

## 2023-11-17 ENCOUNTER — Ambulatory Visit: Payer: Self-pay | Admitting: Family Medicine

## 2023-11-17 NOTE — Telephone Encounter (Unsigned)
Copied from CRM [id number]. Topic: Clinical - Red Word Triage >> Nov 17, 2023 12:09 PM Eunice Blase wrote: Red Word that prompted transfer to Nurse Triage: Pt called was seen on 11/13/23 for coughing now pt has pain on her left side and to under her left arm.

## 2023-11-17 NOTE — Telephone Encounter (Signed)
Copied from CRM [id number]. Topic: Clinical - Red Word Triage >> Nov 17, 2023 12:09 PM Molly Ryan wrote: [facility] Word that prompted transfer to Nurse Triage: Pt called was seen on 11/13/23 for coughing now pt has pain on her left side and to under her left arm.  Chief Complaint: chest pain Symptoms: Yesterday pain begin on chest area from mid chest to left  side under breast area and into armpit 7/10 chest pain, struggles to breath & struggles to take deep breathe constantly Frequency: yesterday Pertinent Negatives: Patient denies n/v Disposition: [x] ED /[] Urgent Care (no appt availability in office) / [] Appointment(In office/virtual)/ []  [place] [facility]/ [] Home Care/ [] Refused Recommended Disposition /[] [place] [facility]/ []  Follow-up with PCP Additional Notes: 11/13/23 called r/t cough - has not stopped, just a little better. Productive at times- clear.pt takes 1000 mg tylenol for pain: helps for a while and then comes back and pt takes more medication.  Nurse recommended ER: pt refused  Reason for Disposition  [1] Chest pain (or "angina") comes and goes AND [2] is happening more often (increasing in frequency) or getting worse (increasing in severity)  (Exception: Chest pains that last only a few seconds.)  Answer Assessment - Initial Assessment Questions 1. LOCATION: "Where does it hurt?"       11/13/23 called r/t cough - has not stopped, just a little better. Productive at times- clear. Yesterday pain begin on chest area from mid chest to left  side under breast area and into armpit 2. RADIATION: "Does the pain go anywhere else?" (e.g., into neck, jaw, arms, back)     no 3. ONSET: "When did the chest pain begin?" (Minutes, hours or days)      yesterday 4. PATTERN: "Does the pain come and go, or has it been constant since it started?"  "Does it get worse with exertion?"      constant 5. DURATION: "How long does it last" (e.g., seconds, minutes, hours)     constant 6. SEVERITY: "How bad  is the pain?"  (e.g., Scale 1-10; mild, moderate, or severe)    - MILD (1-3): doesn't interfere with normal activities     - MODERATE (4-7): interferes with normal activities or awakens from sleep    - SEVERE (8-10): excruciating pain, unable to do any normal activities           7/10 constant pain - a lot of pressure: feels better if push or hold pressure on area 7. CARDIAC RISK FACTORS: "Do you have any history of heart problems or risk factors for heart disease?" (e.g., angina, prior heart attack; diabetes, high blood pressure, high cholesterol, smoker, or strong family history of heart disease)     N/a 8. PULMONARY RISK FACTORS: "Do you have any history of lung disease?"  (e.g., blood clots in lung, asthma, emphysema, birth control pills)     N/a 9. CAUSE: "What do you think is causing the chest pain?"     N/a 10. OTHER SYMPTOMS: "Do you have any other symptoms?" (e.g., dizziness, nausea, vomiting, sweating, fever, difficulty breathing, cough)         Struggle to get deep breath deep due chest discomfort, struggles to breathe 11. PREGNANCY: "Is there any chance you are pregnant?" "When was your last menstrual period?"       N/a  Protocols used: Chest Pain-A-AH

## 2023-11-17 NOTE — Telephone Encounter (Signed)
Attempted to call [facility] to make aware of pt refusal: voicemail came on stated Clinical Access is not available.

## 2023-11-19 ENCOUNTER — Other Ambulatory Visit: Payer: Self-pay | Admitting: Physician Assistant

## 2023-11-19 DIAGNOSIS — R051 Acute cough: Secondary | ICD-10-CM

## 2023-11-19 NOTE — Telephone Encounter (Signed)
 Called and informed pt the [name] put in a chest xray for pt to have done at her earliest convenience

## 2023-12-04 ENCOUNTER — Other Ambulatory Visit: Payer: Self-pay | Admitting: Nurse Practitioner

## 2023-12-19 ENCOUNTER — Ambulatory Visit: Payer: 59 | Admitting: Family Medicine

## 2023-12-22 ENCOUNTER — Ambulatory Visit: Admitting: Family Medicine

## 2023-12-22 VITALS — BP 134/78 | HR 60 | Temp 98.4°F | Ht 60.0 in | Wt 144.0 lb

## 2023-12-22 DIAGNOSIS — F5104 Psychophysiologic insomnia: Secondary | ICD-10-CM | POA: Diagnosis not present

## 2023-12-22 DIAGNOSIS — Z1211 Encounter for screening for malignant neoplasm of colon: Secondary | ICD-10-CM

## 2023-12-22 MED ORDER — ONDANSETRON HCL 8 MG PO TABS: 8.0000 mg | ORAL_TABLET | Freq: Three times a day (TID) | ORAL | 2 refills | Status: DC | PRN

## 2023-12-22 MED ORDER — MECLIZINE HCL 25 MG PO TABS: ORAL_TABLET | ORAL | 2 refills | Status: AC

## 2023-12-22 MED ORDER — CITALOPRAM HYDROBROMIDE 20 MG PO TABS: 20.0000 mg | ORAL_TABLET | Freq: Every day | ORAL | 2 refills | Status: DC

## 2023-12-22 MED ORDER — ESZOPICLONE 3 MG PO TABS: ORAL_TABLET | ORAL | 5 refills | Status: DC

## 2023-12-22 NOTE — Progress Notes (Signed)
   Subjective:    Patient ID: Molly Ryan, female    DOB: 1961/02/21, 63 y.o.   MRN: [id number]  HPI  Patient under the care of endocrinology for diabetes doing well with that Patient also has insomnia issues for which she needs refills She states that helps her Stress levels are reasonable she would like to be on the lower dose of the Celexa she denies being depressed  Review of Systems     Objective:   Physical Exam  General-in no acute distress Eyes-no discharge Lungs-respiratory rate normal, CTA CV-no murmurs,RRR Extremities skin warm dry no edema Neuro grossly normal Behavior normal, alert       Assessment & Plan:  1. Chronic insomnia (Primary) Refills given on Lunesta.  Patient understands when she takes this to devote at least 8 hours to this medication  2. Encounter for screening colonoscopy Screening  Stress levels are doing better reduce Celexa to 20 mg daily

## 2023-12-30 ENCOUNTER — Telehealth: Payer: Self-pay

## 2023-12-30 ENCOUNTER — Other Ambulatory Visit (HOSPITAL_COMMUNITY): Payer: Self-pay

## 2023-12-30 NOTE — Telephone Encounter (Signed)
 Pharmacy Patient Advocate Encounter   Received notification from CoverMyMeds that prior authorization for Molly Ryan is required/requested.   Insurance verification completed.   The patient is insured through [employer] .   Per test claim: Refill too soon. PA is not needed at this time. Medication was filled 12/13/2023. Next eligible fill date is 02/13/2024.

## 2024-03-29 ENCOUNTER — Other Ambulatory Visit (HOSPITAL_COMMUNITY): Payer: Self-pay

## 2024-05-07 ENCOUNTER — Encounter: Payer: Self-pay | Admitting: Nurse Practitioner

## 2024-05-07 ENCOUNTER — Ambulatory Visit: Payer: 59 | Admitting: Nurse Practitioner

## 2024-05-07 VITALS — BP 110/70 | HR 58 | Ht 60.0 in | Wt 142.0 lb

## 2024-05-07 DIAGNOSIS — Z7985 Long-term (current) use of injectable non-insulin antidiabetic drugs: Secondary | ICD-10-CM | POA: Diagnosis not present

## 2024-05-07 DIAGNOSIS — E119 Type 2 diabetes mellitus without complications: Secondary | ICD-10-CM | POA: Diagnosis not present

## 2024-05-07 DIAGNOSIS — Z7984 Long term (current) use of oral hypoglycemic drugs: Secondary | ICD-10-CM | POA: Diagnosis not present

## 2024-05-07 DIAGNOSIS — E782 Mixed hyperlipidemia: Secondary | ICD-10-CM | POA: Diagnosis not present

## 2024-05-07 LAB — POCT GLYCOSYLATED HEMOGLOBIN (HGB A1C): Hemoglobin A1C: 5.6 % (ref 4.0–5.6)

## 2024-05-07 MED ORDER — ONDANSETRON HCL 8 MG PO TABS: 8.0000 mg | ORAL_TABLET | Freq: Three times a day (TID) | ORAL | 2 refills | Status: AC | PRN

## 2024-05-07 MED ORDER — MOUNJARO 10 MG/0.5ML ~~LOC~~ SOAJ: 10.0000 mg | SUBCUTANEOUS | 3 refills | Status: DC

## 2024-05-07 NOTE — Progress Notes (Signed)
 Endocrinology Follow Up Note       05/07/2024, 8:49 AM   Subjective:    Patient ID: Molly Ryan, female    DOB: 1961/06/07.  Molly Ryan is being seen in follow up after being seen in consultation for management of currently uncontrolled symptomatic diabetes requested by  Alphonsa Glendia LABOR, MD.   Past Medical History:  Diagnosis Date   Chronic insomnia    Migraine headache    Reflux     Past Surgical History:  Procedure Laterality Date   APPENDECTOMY     CHOLECYSTECTOMY     LAPAROSCOPIC BILATERAL SALPINGO OOPHERECTOMY Bilateral 2014   VAGINAL HYSTERECTOMY  2006    Social History   Socioeconomic History   Marital status: Married    Spouse name: Not on file   Number of children: Not on file   Years of education: Not on file   Highest education level: Not on file  Occupational History   Not on file  Tobacco Use   Smoking status: Never   Smokeless tobacco: Never  Substance and Sexual Activity   Alcohol use: Yes    Comment: occassionally   Drug use: No   Sexual activity: Yes  Other Topics Concern   Not on file  Social History Narrative   Not on file   Social Drivers of Health   Financial Resource Strain: Not on file  Food Insecurity: Not on file  Transportation Needs: Not on file  Physical Activity: Not on file  Stress: Not on file  Social Connections: Not on file    Family History  Problem Relation Age of Onset   Hypertension Brother    Cancer Brother        lung   Cancer Mother        lung   COPD Mother    Diabetes Maternal Grandmother     Outpatient Encounter Medications as of 05/07/2024  Medication Sig   citalopram  (CELEXA ) 20 MG tablet Take 1 tablet (20 mg total) by mouth daily.   Eszopiclone  3 MG TABS 1 nightly as needed   meclizine  (ANTIVERT ) 25 MG tablet Take one tablet every 6 hours prn dizziness. Caution drowiness (Patient taking differently: as needed. Take  one tablet every 6 hours prn dizziness. Caution drowiness)   [DISCONTINUED] metFORMIN  (GLUCOPHAGE ) 500 MG tablet Take 1 tablet (500 mg total) by mouth 2 (two) times daily with a meal. (Patient taking differently: Take 500 mg by mouth 2 (two) times daily with a meal. Patient  reports she is taking 1 time weekly)   [DISCONTINUED] MOUNJARO  10 MG/0.5ML Pen ADMINISTER 10 MG UNDER THE SKIN ONCE A WEEK   [DISCONTINUED] ondansetron  (ZOFRAN ) 8 MG tablet Take 1 tablet (8 mg total) by mouth every 8 (eight) hours as needed for nausea or vomiting.   HYDROcodone -acetaminophen  (NORCO/VICODIN) 5-325 MG tablet Take 1 tablet by mouth every 6 (six) hours as needed for moderate pain (pain score 4-6). (Patient not taking: Reported on 05/07/2024)   ondansetron  (ZOFRAN ) 8 MG tablet Take 1 tablet (8 mg total) by mouth every 8 (eight) hours as needed for nausea or vomiting.   tirzepatide  (MOUNJARO ) 10 MG/0.5ML Pen Inject 10 mg into the skin  once a week.   [DISCONTINUED] potassium chloride  SA (KLOR-CON  M) 20 MEQ tablet Take 1 tablet (20 mEq total) by mouth 2 (two) times daily for 3 days. (Patient not taking: Reported on 05/07/2024)   [DISCONTINUED] promethazine  (PHENERGAN ) 25 MG tablet Take 1 tablet (25 mg total) by mouth every 6 (six) hours as needed for nausea or vomiting. (Patient not taking: Reported on 05/07/2024)   No facility-administered encounter medications on file as of 05/07/2024.    ALLERGIES: Allergies  Allergen Reactions   Percocet [Oxycodone -Acetaminophen ] Anaphylaxis   Morphine And Codeine     Muscle spasms   Ozempic  (0.25 Or 0.5 Mg-Dose) [Semaglutide (0.25 Or 0.5mg -Dos)]     Vision changes   Amoxicillin Rash    VACCINATION STATUS: Immunization History  Administered Date(s) Administered   Influenza,inj,Quad PF,6+ Mos 09/11/2015, 11/15/2016, 08/22/2017, 06/30/2020   Influenza-Unspecified 08/01/2018, 07/29/2019, 08/05/2022   PFIZER(Purple Top)SARS-COV-2 Vaccination 10/25/2019, 11/25/2019   PNEUMOCOCCAL  CONJUGATE-20 08/20/2021   Pneumococcal Polysaccharide-23 11/15/2016    Diabetes She presents for her follow-up diabetic visit. She has type 2 diabetes mellitus. Onset time: diagnosed at approx age of 63. Her disease course has been improving. There are no hypoglycemic associated symptoms. There are no hypoglycemic complications. There are no diabetic complications. Risk factors for coronary artery disease include diabetes mellitus, dyslipidemia, obesity, sedentary lifestyle and post-menopausal. Current diabetic treatments: Mounjaro . She is compliant with treatment most of the time (cut back on Metformin  to once weekly (says causing her constipation)). Her weight is fluctuating minimally. She is following a generally healthy diet. When asked about meal planning, she reported none. She has not had a previous visit with a dietitian. She participates in exercise intermittently. (She presents today with no meter or logs to review (does not need to routinely monitor given safe regimen).  Her POCT A1c today is 5.6%, improving from last visit of 5.9%. She has cut back on her Metformin  to taking 500 mg once weekly, thinks it was causing some constipation.) An ACE inhibitor/angiotensin II receptor blocker is not being taken. She does not see a podiatrist.Eye exam is current.     Review of systems  Constitutional: + stable body weight, current Body mass index is 27.73 kg/m., no fatigue, no subjective hyperthermia, no subjective hypothermia Eyes: no blurry vision, no xerophthalmia ENT: no sore throat, no nodules palpated in throat, no dysphagia/odynophagia, no hoarseness Cardiovascular: no chest pain, no shortness of breath, no palpitations, no leg swelling Respiratory: no cough, no shortness of breath Gastrointestinal: no vomiting/diarrhea, + intermittent nausea Musculoskeletal: no muscle/joint aches Skin: no rashes, no hyperemia Neurological: no tremors, no numbness, no tingling, no dizziness Psychiatric:  no depression, no anxiety  Objective:     BP 110/70 (BP Location: Left Arm, Patient Position: Sitting, Cuff Size: Large)   Pulse (!) 58   Ht 5' (1.524 m)   Wt 142 lb (64.4 kg)   BMI 27.73 kg/m   Wt Readings from Last 3 Encounters:  05/07/24 142 lb (64.4 kg)  12/22/23 144 lb (65.3 kg)  11/13/23 145 lb (65.8 kg)     BP Readings from Last 3 Encounters:  05/07/24 110/70  12/22/23 134/78  11/13/23 (!) 152/98      Physical Exam- Limited  Constitutional:  Body mass index is 27.73 kg/m. , not in acute distress, normal state of mind Eyes:  EOMI, no exophthalmos Musculoskeletal: no gross deformities, strength intact in all four extremities, no gross restriction of joint movements Skin:  no rashes, no hyperemia Neurological: no tremor with outstretched hands  Diabetic Foot Exam - Simple   No data filed     CMP ( most recent) CMP     Component Value Date/Time   NA 135 08/25/2023 1338   NA 139 06/24/2023 0741   K 3.1 (L) 08/25/2023 1338   CL 99 08/25/2023 1338   CO2 22 08/25/2023 1338   GLUCOSE 116 (H) 08/25/2023 1338   BUN 16 08/25/2023 1338   BUN 14 06/24/2023 0741   CREATININE 0.87 08/25/2023 1338   CALCIUM  9.5 08/25/2023 1338   PROT 7.4 08/25/2023 1338   PROT 7.2 06/24/2023 0741   ALBUMIN 4.3 08/25/2023 1338   ALBUMIN 4.6 06/24/2023 0741   AST 15 08/25/2023 1338   ALT 18 08/25/2023 1338   ALKPHOS 55 08/25/2023 1338   BILITOT 1.1 08/25/2023 1338   BILITOT 0.5 06/24/2023 0741   GFRNONAA >60 08/25/2023 1338   GFRAA 113 06/16/2020 0754     Diabetic Labs (most recent): Lab Results  Component Value Date   HGBA1C 5.6 05/07/2024   HGBA1C 5.9 (A) 11/07/2023   HGBA1C 6.3 (H) 06/24/2023     Lipid Panel ( most recent) Lipid Panel     Component Value Date/Time   CHOL 130 06/24/2023 0741   TRIG 102 06/24/2023 0741   HDL 61 06/24/2023 0741   CHOLHDL 2.1 06/24/2023 0741   CHOLHDL 2.9 01/21/2014 0945   VLDL 23 01/21/2014 0945   LDLCALC 50 06/24/2023 0741    LABVLDL 19 06/24/2023 0741      Lab Results  Component Value Date   TSH 0.911 03/28/2018   TSH 1.270 11/15/2016   TSH 1.073 01/21/2014           Assessment & Plan:   1) Type 2 diabetes mellitus without complication, without long-term current use of insulin   She presents today with no meter or logs to review (does not need to routinely monitor given safe regimen).  Her POCT A1c today is 5.6%, improving from last visit of 5.9%. She has cut back on her Metformin  to taking 500 mg once weekly, thinks it was causing some constipation.  - Molly Ryan has currently uncontrolled symptomatic type 2 DM since 63 years of age.   -Recent labs reviewed.  - I had a long discussion with her about the progressive nature of diabetes and the pathology behind its complications. -her diabetes is not currently complicated but she remains at a high risk for more acute and chronic complications which include CAD, CVA, CKD, retinopathy, and neuropathy. These are all discussed in detail with her.  The following Lifestyle Medicine recommendations according to American College of Lifestyle Medicine [facility]) were discussed and offered to patient and she agrees to start the journey:  A. Whole Foods, Plant-based plate comprising of fruits and vegetables, plant-based proteins, whole-grain carbohydrates was discussed in detail with the patient.   A list for source of those nutrients were also provided to the patient.  Patient will use only water or unsweetened tea for hydration. B.  The need to stay away from risky substances including alcohol, smoking; obtaining 7 to 9 hours of restorative sleep, at least 150 minutes of moderate intensity exercise weekly, the importance of healthy social connections,  and stress reduction techniques were discussed. C.  A full color page of  Calorie density of various food groups per pound showing examples of each food groups was provided to the patient.  - Nutritional counseling  repeated at each appointment due to patients tendency to fall back in to old  habits.  - The patient admits there is a room for improvement in their diet and drink choices. -  Suggestion is made for the patient to avoid simple carbohydrates from their diet including Cakes, Sweet Desserts / Pastries, Ice Cream, Soda (diet and regular), Sweet Tea, Candies, Chips, Cookies, Sweet Pastries, Store Bought Juices, Alcohol in Excess of 1-2 drinks a day, Artificial Sweeteners, Coffee Creamer, and Sugar-free Products. This will help patient to have stable blood glucose profile and potentially avoid unintended weight gain.   - I encouraged the patient to switch to unprocessed or minimally processed complex starch and increased protein intake (animal or plant source), fruits, and vegetables.   - Patient is advised to stick to a routine mealtimes to eat 3 meals a day and avoid unnecessary snacks (to snack only to correct hypoglycemia).  - I have approached her with the following individualized plan to manage her diabetes and patient agrees:   -She is advised to continue her Mounjaro  10 mg SQ weekly.  Will stop her Metformin  altogether today.  -she can take a break from routine monitoring given safe regimen.  - Adjustment parameters are given to her for hypo and hyperglycemia in writing.  - Specific targets for  A1c; LDL, HDL, and Triglycerides were discussed with the patient.  2) Blood Pressure /Hypertension:  her blood pressure is controlled to target without any antihypertensive medications.    3) Lipids/Hyperlipidemia:    Review of her recent lipid panel from 06/24/23 showed controlled LDL at 50, improving greatly.  she is advised to continue Crestor  20 mg daily at bedtime.  Side effects and precautions discussed with her.  4)  Weight/Diet:  her Body mass index is 27.73 kg/m.  -  clearly complicating her diabetes care.   she is a candidate for weight loss. I discussed with her the fact that loss of 5  - 10% of her  current body weight will have the most impact on her diabetes management.  Exercise, and detailed carbohydrates information provided  -  detailed on discharge instructions.  5) Chronic Care/Health Maintenance: -she is not on ACEI/ARB and is on Statin medications and is encouraged to initiate and continue to follow up with Ophthalmology, Dentist, Podiatrist at least yearly or according to recommendations, and advised to stay away from smoking. I have recommended yearly flu vaccine and pneumonia vaccine at least every 5 years; moderate intensity exercise for up to 150 minutes weekly; and sleep for at least 7 hours a day.  - she is advised to maintain close follow up with Alphonsa Glendia LABOR, MD for primary care needs, as well as her other providers for optimal and coordinated care.     I spent  30  minutes in the care of the patient today including review of labs from CMP, Lipids, Thyroid  Function, Hematology (current and previous including abstractions from other facilities); face-to-face time discussing  her blood glucose readings/logs, discussing hypoglycemia and hyperglycemia episodes and symptoms, medications doses, her options of short and long term treatment based on the latest standards of care / guidelines;  discussion about incorporating lifestyle medicine;  and documenting the encounter. Risk reduction counseling performed per USPSTF guidelines to reduce obesity and cardiovascular risk factors.     Please refer to Patient Instructions for Blood Glucose Monitoring and Insulin /Medications Dosing Guide  in media tab for additional information. Please  also refer to  Patient Self Inventory in the Media  tab for reviewed elements of pertinent patient history.  Molly Ryan  participated in the discussions, expressed understanding, and voiced agreement with the above plans.  All questions were answered to her satisfaction. she is encouraged to contact clinic should she have any  questions or concerns prior to her return visit.     Follow up plan: - Return in about 6 months (around 11/07/2024) for Diabetes F/U with A1c in office, No previsit labs.   Benton Rio, [facility] [address] [place], [place] [address] Phone: [phone] Fax: [phone]  05/07/2024, 8:49 AM

## 2024-05-25 ENCOUNTER — Other Ambulatory Visit: Payer: Self-pay | Admitting: Nurse Practitioner

## 2024-06-21 ENCOUNTER — Ambulatory Visit: Admitting: Family Medicine

## 2024-06-21 ENCOUNTER — Telehealth: Payer: Self-pay | Admitting: Family Medicine

## 2024-06-21 VITALS — BP 124/74 | HR 66 | Temp 97.2°F | Ht 60.0 in | Wt 140.0 lb

## 2024-06-21 DIAGNOSIS — E1169 Type 2 diabetes mellitus with other specified complication: Secondary | ICD-10-CM | POA: Diagnosis not present

## 2024-06-21 DIAGNOSIS — E119 Type 2 diabetes mellitus without complications: Secondary | ICD-10-CM

## 2024-06-21 DIAGNOSIS — Z79899 Other long term (current) drug therapy: Secondary | ICD-10-CM

## 2024-06-21 DIAGNOSIS — H00012 Hordeolum externum right lower eyelid: Secondary | ICD-10-CM

## 2024-06-21 DIAGNOSIS — E785 Hyperlipidemia, unspecified: Secondary | ICD-10-CM

## 2024-06-21 DIAGNOSIS — Z7985 Long-term (current) use of injectable non-insulin antidiabetic drugs: Secondary | ICD-10-CM | POA: Diagnosis not present

## 2024-06-21 MED ORDER — ESZOPICLONE 3 MG PO TABS: ORAL_TABLET | ORAL | 5 refills | Status: DC

## 2024-06-21 MED ORDER — DOXYCYCLINE HYCLATE 100 MG PO TABS: 100.0000 mg | ORAL_TABLET | Freq: Two times a day (BID) | ORAL | 0 refills | Status: DC

## 2024-06-21 NOTE — Telephone Encounter (Signed)
 Patient states you wanted her to have labs done in 30 days please put orders in.

## 2024-06-21 NOTE — Progress Notes (Signed)
 Molly Ryan is a 63 year old female with diabetes who presents with concerns about her eye and medication management.  She has been experiencing an eye issue for over a week, characterized by tenderness and a sensation of grit. The symptoms began after cleaning with bleach without gloves, leading to sweat and potential contaminants entering her eye. She has been using warm compresses and eye drops to manage symptoms, which include a sensation of burning and some drainage, but the issue persists.  She is currently on Mounjaro  for diabetes management, which has helped control her A1c, recently measured at 5.6. She notes that the medication seems to have leveled off in its effectiveness, with some days being better than others in terms of appetite suppression. She recently received a 90-day supply of Mounjaro .  She experiences variable energy levels, often feeling fatigued after hectic days at work. Her work is described as hectic and understaffed, contributing to her fatigue. She describes needing to rest and 'perch' when her body signals that she has had enough.  She reports sleep disturbances, stating that without Lunesta , she does not achieve deep sleep and experiences frequent awakenings. She has tried tapering off Lunesta  but finds her sleep quality significantly worsens without it, leading to increased fatigue.  She is on Celexa  for mood stabilization and reports that it helps her manage her mood effectively. She describes her work environment as stressful and hectic, which impacts her overall stress levels.  There is a family history of breast cancer, which influences her decision to stay on top of mammograms, although she is currently behind on her screenings. Past Medical History Copy/Paste - Type 2 diabetes mellitus - Insomnia - Depression  Medications Copy/Paste - Mounjaro  - Celexa  - Lunesta  Physical Exam VITALS: BP- 124/74 CHEST: Lungs clear to auscultation  bilaterally. EXTREMITIES: Sensation intact in feet. NEUROLOGICAL: Sensation intact in feet. Results LABS   Hemoglobin A1c: 5.6% Assessment & Plan Chalazion of left eye Chalazion likely due to staph infection from blocked pores. Reports tenderness and gritty sensation. Some drainage present. - Prescribed doxycycline , one tablet twice daily with food and water. - Advised continuation of warm compresses. - Instructed to notify if no improvement in 7-10 days for potential ophthalmology referral.  Type 2 diabetes mellitus Type 2 diabetes well-controlled with A1c of 5.6. Current regimen includes Mounjaro , aiding in diabetes and weight control. Reports leveling off in appetite suppression. - Order comprehensive blood work: kidney function, electrolytes, cholesterol, liver function, urine protein. - Share results with endocrinology. - Advise completion of blood work within 30 days, preferably in October. - Instruct to hydrate before blood work and fast until after.  Obesity Obesity management includes Mounjaro , assisting with weight control. Reports leveling off in appetite suppression.  Insomnia Chronic insomnia managed with Lunesta . Attempts to taper off result in poor sleep and fatigue. - Updated prescription for Lunesta .  Stress  well-managed with Celexa . Reports stable mood.  General Health Maintenance Annual comprehensive blood work due. Overdue for mammogram. Upcoming flu vaccine campaign at workplace. - Recommend scheduling a mammogram. - Advise participation in workplace flu vaccine campaign.

## 2024-06-22 NOTE — Telephone Encounter (Signed)
 Orders placed.

## 2024-06-22 NOTE — Telephone Encounter (Signed)
 Lipid, liver, metabolic 7, urine ACR  Hyperlipidemia, diabetes, high risk med (I believe I also wrote some down on the paper for the nurses to do-once this is done may ignore paper)

## 2024-06-23 ENCOUNTER — Other Ambulatory Visit: Payer: Self-pay

## 2024-06-23 DIAGNOSIS — E1169 Type 2 diabetes mellitus with other specified complication: Secondary | ICD-10-CM

## 2024-06-23 DIAGNOSIS — E119 Type 2 diabetes mellitus without complications: Secondary | ICD-10-CM

## 2024-06-23 DIAGNOSIS — Z79899 Other long term (current) drug therapy: Secondary | ICD-10-CM

## 2024-07-23 ENCOUNTER — Encounter: Payer: Self-pay | Admitting: Family Medicine

## 2024-07-23 ENCOUNTER — Ambulatory Visit: Payer: Self-pay

## 2024-07-23 LAB — BASIC METABOLIC PANEL WITH GFR
BUN/Creatinine Ratio: 14 (ref 12–28)
BUN: 11 mg/dL (ref 8–27)
CO2: 23 mmol/L (ref 20–29)
Calcium: 10 mg/dL (ref 8.7–10.3)
Chloride: 98 mmol/L (ref 96–106)
Creatinine, Ser: 0.78 mg/dL (ref 0.57–1.00)
Glucose: 106 mg/dL — ABNORMAL HIGH (ref 70–99)
Potassium: 4.1 mmol/L (ref 3.5–5.2)
Sodium: 138 mmol/L (ref 134–144)
eGFR: 86 mL/min/1.73 (ref >59)

## 2024-07-23 LAB — HEPATIC FUNCTION PANEL
ALT: 12 IU/L (ref 0–32)
AST: 15 IU/L (ref 0–40)
Albumin: 4.6 g/dL (ref 3.9–4.9)
Alkaline Phosphatase: 64 IU/L (ref 49–135)
Bilirubin Total: 0.6 mg/dL (ref 0.0–1.2)
Bilirubin, Direct: 0.21 mg/dL (ref 0.00–0.40)
Total Protein: 7.2 g/dL (ref 6.0–8.5)

## 2024-07-23 LAB — LIPID PANEL
Chol/HDL Ratio: 2.1 ratio (ref 0.0–4.4)
Cholesterol, Total: 169 mg/dL (ref 100–199)
HDL: 79 mg/dL (ref >39)
LDL Chol Calc (NIH): 78 mg/dL (ref 0–99)
Triglycerides: 61 mg/dL (ref 0–149)
VLDL Cholesterol Cal: 12 mg/dL (ref 5–40)

## 2024-07-23 LAB — MICROALBUMIN / CREATININE URINE RATIO
Creatinine, Urine: 61.4 mg/dL
Microalb/Creat Ratio: 7 mg/g{creat} (ref 0–29)
Microalbumin, Urine: 4.5 ug/mL

## 2024-07-23 NOTE — Telephone Encounter (Signed)
 Dr Glendia not in office - patient ok seeing Elveria NP- appointment scheduled 07/26/24 with Elveria NP

## 2024-07-23 NOTE — Telephone Encounter (Signed)
 Patient states he only wants to see Dr. Alphonsa and would like to see if anything can be done about an appointment. Please advise.       FYI Only or Action Required?: Action required by provider: request for appointment.  Patient was last seen in primary care on 06/21/2024 by Alphonsa Glendia LABOR, MD.  Called Nurse Triage reporting Depression and Anxiety.  Symptoms began several months ago.  Symptoms are: gradually worsening.  Triage Disposition: See Physician Within 24 Hours  Patient/caregiver understands and will follow disposition?: No, wishes to speak with PCP      Reason for Disposition  [1] Depression AND [2] getting worse (e.g., sleeping poorly, less able to do activities of daily living)  Protocols used: Depression-A-AH

## 2024-07-23 NOTE — Telephone Encounter (Signed)
 See my chart message- patient scheduled with Elveria 07/26/24- Dr Glendia not in office

## 2024-07-23 NOTE — Telephone Encounter (Signed)
 Triage RN lost audio connection with patient, patient can no longer hear RN. Attempted reset of audio system and technical issue continues. Placing patient in call backs.    Copied from CRM [id number]. Topic: Clinical - Red Word Triage >> Jul 23, 2024  9:48 AM Kevelyn M wrote: Patient has been experiencing anxiety and depression for a few months. She would not go into detail. No appointments available with provider until Jan. Answer Assessment - Initial Assessment Questions 1. CONCERN: What happened that made you call today?     She states she has been dealing with a lot of work stress. She states it is a very frantic feeling, hard to describe.  2. DEPRESSION SYMPTOM SCREENING: How are you feeling overall? (e.g., decreased energy, increased sleeping or difficulty sleeping, difficulty concentrating, feelings of sadness, guilt, hopelessness, or worthlessness)    Dread feeling; frantic. She states almost all the time she feels dread, doom and heart palpitations.  3. RISK OF HARM - SUICIDAL IDEATION:  Do you ever have thoughts of hurting or killing yourself?  (e.g., yes, no, no but preoccupation with thoughts about death)     No.  4. RISK OF HARM - HOMICIDAL IDEATION:  Do you ever have thoughts of hurting or killing someone else?  (e.g., yes, no, no but preoccupation with thoughts about death)     No.  5. FUNCTIONAL IMPAIRMENT: How have things been going for you overall? Have you had more difficulty than usual doing your normal daily activities?  (e.g., better, same, worse; self-care, school, work, interactions)     She states she feels like she doesn't want to leave the house. She states doesn't want to interact with people.  6. SUPPORT: Who is with you now? Who do you live with? Do you have family or friends who you can talk to?      She lives at home with her husband and cat. She states she tries to talk to her husband about how she is feeling, he doesn't understand what she is  talking about but she states he is a good listener.  7. THERAPIST: Do you have a counselor or therapist? If Yes, ask: What is their name?     No. She is open/interested to talking with a counselor or therapist.  8. STRESSORS: Has there been any new stress or recent changes in your life?     She states she is being bullied at work.  9. ALCOHOL USE OR SUBSTANCE USE (DRUG USE): Do you drink alcohol or use any illegal drugs?     *No Answer*  10. OTHER: Do you have any other physical symptoms right now? (e.g., fever)       *No Answer*  11. PREGNANCY: Is there any chance you are pregnant? When was your last menstrual period?       N/A.  Protocols used: Depression-A-AH

## 2024-07-26 ENCOUNTER — Ambulatory Visit: Payer: Self-pay | Admitting: Nurse Practitioner

## 2024-07-26 ENCOUNTER — Encounter: Payer: Self-pay | Admitting: Nurse Practitioner

## 2024-07-26 ENCOUNTER — Ambulatory Visit: Payer: Self-pay | Admitting: Family Medicine

## 2024-07-26 VITALS — BP 142/88 | HR 71 | Temp 97.5°F | Ht 60.0 in | Wt 140.5 lb

## 2024-07-26 DIAGNOSIS — F418 Other specified anxiety disorders: Secondary | ICD-10-CM | POA: Diagnosis not present

## 2024-07-26 DIAGNOSIS — F430 Acute stress reaction: Secondary | ICD-10-CM

## 2024-07-26 DIAGNOSIS — F411 Generalized anxiety disorder: Secondary | ICD-10-CM

## 2024-07-26 MED ORDER — CLONAZEPAM 0.5 MG PO TABS: ORAL_TABLET | ORAL | 0 refills | Status: DC

## 2024-07-26 NOTE — Patient Instructions (Addendum)
 Biotin as directed for hair loss  Counseling: [email or web address]

## 2024-07-27 NOTE — Progress Notes (Signed)
 Subjective:    Patient ID: Molly Ryan, female    DOB: 02/06/1961, 63 y.o.   MRN: [id number]  Anxiety Symptoms include decreased concentration and nervous/anxious behavior. Patient reports no chest pain, shortness of breath or suicidal ideas.     Discussed the use of AI scribe software for clinical note transcription with the patient, who gave verbal consent to proceed.  History of Present Illness Molly Ryan is a 63 year old female who presents with anxiety and stress related to work and life.  She experiences significant anxiety and stress related to her work environment and personal life, feeling safest at home and often staying inside to avoid stressors. She works at a dialysis clinic and has been dealing with a difficult work relationship that has exacerbated her stress levels. She denies any thoughts of self-harm or harming others. She has experienced panic attacks, describing a sensation of her heart 'walking right out of my chest.'  She reports sleep disturbances, stating she has 'problems sleeping'. She takes Lunesta  but still only manages to sleep three to five hours per night. She is currently on citalopram  20 mg for anxiety and depression but has experienced adverse effects when the dose was increased in the past, feeling 'ruined' and 'not functioning' on a higher dose. She also reports difficulty focusing and short-term memory loss, which she attributes to her current stress levels. She is on Mounjaro , which she notes affects her appetite.  She mentions her hair is thinning, which she attributes to stress and possibly hormonal changes. She has tried biotin supplements in the past without noticeable improvement. No chest pain, shortness of breath, coughing, or wheezing.  Denies suicidal or homicidal thoughts or ideation.  Denies any self-harm behaviors.    07/26/2024   11:33 AM  Depression screen PHQ 2/9  Decreased Interest 3  Down, Depressed, Hopeless 3  PHQ - 2  Score 6  Altered sleeping 3  Tired, decreased energy 3  Change in appetite 3  Feeling bad or failure about yourself  3  Trouble concentrating 3  Moving slowly or fidgety/restless 0  Suicidal thoughts 0  PHQ-9 Score 21  Difficult doing work/chores Extremely dIfficult      07/26/2024   11:33 AM 12/22/2023    4:22 PM 11/13/2023    2:00 PM 08/28/2023    4:19 PM  GAD 7 : Generalized Anxiety Score  Nervous, Anxious, on Edge 3 1 3 3   Control/stop worrying 3 1 3 3   Worry too much - different things 3 1 3 3   Trouble relaxing 3 1 2 3   Restless 0 0 0 0  Easily annoyed or irritable 3 0 1 1  Afraid - awful might happen 3 1 1 1   Total GAD 7 Score 18 5 13 14   Anxiety Difficulty Extremely difficult Somewhat difficult Somewhat difficult Somewhat difficult   Social History   Tobacco Use   Smoking status: Never   Smokeless tobacco: Never  Substance Use Topics   Alcohol use: Yes    Comment: occassionally   Drug use: No      Review of Systems  Constitutional:  Positive for fatigue.  Respiratory:  Negative for cough, chest tightness, shortness of breath and wheezing.   Cardiovascular:  Negative for chest pain.  Psychiatric/Behavioral:  Positive for decreased concentration, dysphoric mood and sleep disturbance. Negative for self-injury and suicidal ideas. The patient is nervous/anxious.        Objective:   Physical Exam NAD.  Alert, oriented.  Tearful  and crying during most of the office visit.  Making good eye contact.  Speech clear.  Thoughts logical coherent and relevant.  Normal behavior and cognition.  Dressed appropriately for the weather.  Lungs clear.  Heart regular rate rhythm. Today's Vitals   07/26/24 1128  BP: (!) 142/88  Pulse: 71  Temp: (!) 97.5 F (36.4 C)  SpO2: 98%  Weight: 140 lb 8 oz (63.7 kg)  Height: 5' (1.524 m)   Body mass index is 27.44 kg/m.        Assessment & Plan:  1. Anxiety as acute reaction to exceptional stress (Primary)  - clonazePAM  (KLONOPIN) 0.5 MG tablet; Take 1/2-1 tab po BID during the day as needed for severe anxiety. Do not take with Lunesta .  Dispense: 20 tablet; Refill: 0  2. Depression with anxiety Major depressive disorder and anxiety disorder with significant work-related stress and anxiety. Current treatment includes citalopram  20 mg, previously increased to 40 mg but caused adverse effects. Experiencing significant stress and anxiety, leading to social isolation and difficulty functioning at work. Counseling and potential FMLA leave discussed to manage stress and anxiety. - Maintain citalopram  at 20 mg daily. - Initiate clonazepam for daytime anxiety, with instructions not to take within 4 hours of Lunesta . - Recommend counseling with [facility] or [facility], with instructions to apply online for an appointment. - Complete FMLA paperwork for 60 days of continuous leave to allow time for counseling and stress management.  Patient agrees to seek help immediately if any thoughts of harm to herself or others.  Given information on the 988 crisis line for the state. Return in about 1 month (around 08/26/2024). Call back sooner if needed.

## 2024-07-28 ENCOUNTER — Other Ambulatory Visit: Payer: Self-pay | Admitting: Family Medicine

## 2024-08-02 ENCOUNTER — Telehealth: Payer: Self-pay

## 2024-08-02 ENCOUNTER — Encounter: Payer: Self-pay | Admitting: Family Medicine

## 2024-08-02 NOTE — Telephone Encounter (Signed)
 Patient dropped off document FMLA, to be filled out by provider. Patient requested to send it back via Fax within ASAP. Document is located in providers tray at front office.Please advise at Mobile [phone] (mobile)   This will be placed in [place] box she was working on this for the patient

## 2024-08-05 NOTE — Telephone Encounter (Signed)
Completed and given to front desk staff

## 2024-08-24 ENCOUNTER — Ambulatory Visit: Admitting: Nurse Practitioner

## 2024-08-24 VITALS — BP 138/86 | Ht 60.0 in | Wt 138.1 lb

## 2024-08-24 DIAGNOSIS — F418 Other specified anxiety disorders: Secondary | ICD-10-CM

## 2024-08-24 MED ORDER — BUSPIRONE HCL 7.5 MG PO TABS: 7.5000 mg | ORAL_TABLET | Freq: Two times a day (BID) | ORAL | 0 refills | Status: DC

## 2024-08-25 ENCOUNTER — Encounter: Payer: Self-pay | Admitting: Nurse Practitioner

## 2024-08-25 NOTE — Progress Notes (Signed)
 Subjective:    Patient ID: Molly Ryan, female    DOB: [birth year], 63 y.o.   MRN: [id number]  Anxiety Symptoms include nervous/anxious behavior. Patient reports no chest pain, shortness of breath or suicidal ideas.     Discussed the use of AI scribe software for clinical note transcription with the patient, who gave verbal consent to proceed.  History of Present Illness Molly Ryan is a 63 year old female who presents with anxiety and work-related stress.  She experiences ongoing anxiety and stress related to her work environment. She describes a challenging relationship with some coworkers. Her responsibilities include handling payroll, medical records, and inventory counts.  She is currently taking escitalopram 20 mg daily for anxiety and uses clonazepam as needed, approximately three times a week, particularly during stressful situations. Her anxiety can escalate when she starts 'thinking about something,' leading to feelings of being 'all over the place.'  She experiences sleep disturbances, often waking up at 3 AM with racing thoughts, describing her mind as a 'ping pong ball.' She occasionally uses clonazepam to help manage these episodes.  She is concerned about her financial situation, as her husband may require a hip replacement, which could impact her ability to cover the mortgage. She feels pressure to return to work despite the stress it causes, as her current job does not pay adequately but is necessary for financial stability. Has a few more weeks on her current FMLA.  She has an upcoming appointment with a therapist. She has faced challenges in finding a therapist due to insurance issues and availability.  No suicidal thoughts or thoughts of harming others.    Review of Systems  Constitutional:  Positive for fatigue.  Respiratory:  Negative for cough, chest tightness and shortness of breath.   Cardiovascular:  Negative for chest pain.   Psychiatric/Behavioral:  Positive for sleep disturbance. Negative for self-injury and suicidal ideas. The patient is nervous/anxious.       08/24/2024    1:23 PM  Depression screen PHQ 2/9  Decreased Interest 3  Down, Depressed, Hopeless 3  PHQ - 2 Score 6  Altered sleeping 3  Tired, decreased energy 3  Change in appetite 1  Feeling bad or failure about yourself  3  Trouble concentrating 3  Moving slowly or fidgety/restless 1  Suicidal thoughts 0  PHQ-9 Score 20  Difficult doing work/chores Very difficult      08/24/2024    1:23 PM 07/26/2024   11:33 AM 12/22/2023    4:22 PM 11/13/2023    2:00 PM  GAD 7 : Generalized Anxiety Score  Nervous, Anxious, on Edge 3 3 1 3   Control/stop worrying 3 3 1 3   Worry too much - different things 3 3 1 3   Trouble relaxing 3 3 1 2   Restless 1 0 0 0  Easily annoyed or irritable 3 3 0 1  Afraid - awful might happen 3 3 1 1   Total GAD 7 Score 19 18 5 13   Anxiety Difficulty Very difficult Extremely difficult Somewhat difficult Somewhat difficult    Social History   Tobacco Use   Smoking status: Never   Smokeless tobacco: Never  Substance Use Topics   Alcohol use: Yes    Comment: occassionally   Drug use: No       Objective:   Physical Exam Vitals and nursing note reviewed.  Constitutional:      General: She is not in acute distress. Cardiovascular:     Rate and Rhythm:  Normal rate and regular rhythm.  Pulmonary:     Effort: Pulmonary effort is normal.     Breath sounds: Normal breath sounds.  Neurological:     Mental Status: She is alert and oriented to person, place, and time.  Psychiatric:        Attention and Perception: Attention normal.        Mood and Affect: Mood and affect normal.        Speech: Speech normal.        Behavior: Behavior normal. Behavior is cooperative.        Thought Content: Thought content normal.        Cognition and Memory: Cognition normal.        Judgment: Judgment normal.     Comments:  Making good eye contact. Despite her scores on GAD7 and PHQ9, patient is much calmer than previous visit. Cheerful and laughing at times.     Today's Vitals   08/24/24 1315  BP: 138/86  Weight: 138 lb 1.9 oz (62.7 kg)  Height: 5' (1.524 m)   Body mass index is 26.97 kg/m.        Assessment & Plan:  1. Depression with anxiety (Primary) Chronic condition with stressors. Current treatment includes escitalopram and clonazepam. Symptoms improved but anxiety and sleep disturbances persist. No suicidal ideation. Discussed Buspar addition for anxiety management. - Continue escitalopram 20 mg daily. - Continue clonazepam as needed. - Initiated Buspar twice daily. - Proceed with therapy appointment next week. - Monitor for Buspar adverse effects and discontinue if necessary. - Follow-up in one month to assess progress and potential FMLA extension. - busPIRone (BUSPAR) 7.5 MG tablet; Take 1 tablet (7.5 mg total) by mouth 2 (two) times daily.  Dispense: 60 tablet; Refill: 0   Return in about 1 month (around 09/23/2024).

## 2024-09-01 ENCOUNTER — Other Ambulatory Visit: Payer: Self-pay | Admitting: Nurse Practitioner

## 2024-09-01 DIAGNOSIS — F411 Generalized anxiety disorder: Secondary | ICD-10-CM

## 2024-09-07 ENCOUNTER — Telehealth: Payer: Self-pay | Admitting: *Deleted

## 2024-09-07 ENCOUNTER — Telehealth: Payer: Self-pay

## 2024-09-07 NOTE — Telephone Encounter (Signed)
 Copied from CRM #[id number]. Topic: General - Other >> Sep 07, 2024 12:48 PM Nathanel BROCKS wrote: Reason for CRM: pt called and she is needing her fmla paperwork updated for short term disability with [employer]. She wants it to be initialed, extend the extention date that is on the fmla paperwork of 09/27/2024 and fax it over to them.  Pt wants a returned call.

## 2024-09-07 NOTE — Telephone Encounter (Signed)
 Copied from CRM #[id number]. Topic: Appointments - Appointment Scheduling >> Sep 07, 2024 12:56 PM Nathanel BROCKS wrote: Patient/patient representative is calling to schedule an appointment. Refer to attachments for appointment information.     Pt tried to schedule appt but [facility] has nothing until Dec 22. Pt needs to come in before then. Can you please call the pt and see if you can work her in? ----------------------------------------------------------------------- From previous Reason for Contact - Scheduling: Patient/patient representative is calling to schedule an appointment. Refer to attachments for appointment information.

## 2024-09-16 ENCOUNTER — Ambulatory Visit: Admitting: Nurse Practitioner

## 2024-09-16 ENCOUNTER — Encounter: Payer: Self-pay | Admitting: Nurse Practitioner

## 2024-09-16 VITALS — BP 129/82 | HR 60 | Ht 60.0 in | Wt 139.0 lb

## 2024-09-16 DIAGNOSIS — F4000 Agoraphobia, unspecified: Secondary | ICD-10-CM | POA: Insufficient documentation

## 2024-09-16 DIAGNOSIS — F4010 Social phobia, unspecified: Secondary | ICD-10-CM | POA: Insufficient documentation

## 2024-09-16 DIAGNOSIS — F418 Other specified anxiety disorders: Secondary | ICD-10-CM

## 2024-09-16 MED ORDER — BUSPIRONE HCL 10 MG PO TABS: 10.0000 mg | ORAL_TABLET | Freq: Two times a day (BID) | ORAL | 0 refills | Status: DC

## 2024-09-17 ENCOUNTER — Ambulatory Visit: Admitting: Nurse Practitioner

## 2024-09-20 ENCOUNTER — Telehealth: Payer: Self-pay | Admitting: Nurse Practitioner

## 2024-09-20 ENCOUNTER — Encounter: Payer: Self-pay | Admitting: Nurse Practitioner

## 2024-09-20 NOTE — Telephone Encounter (Signed)
 Patient 's FMLA came over today from [place] put in providers office to be completed.

## 2024-09-20 NOTE — Progress Notes (Signed)
 Subjective:    Patient ID: Molly Ryan, female    DOB: 25-Apr-1961, 63 y.o.   MRN: [id number]  Anxiety Symptoms include decreased concentration and nervous/anxious behavior. Patient reports no chest pain, shortness of breath or suicidal ideas.     Presents for recheck on depression and anxiety. Has seen minimal relief with Buspar  7.5 mg BID but no adverse effects. Continues Citalopram  and Eszopiclone . Rare use of clonazepam  and does not take at night. Continues regular follow up with her counselor.  Having extreme social anxiety. States she had great difficulty leaving the house for this appointment. Extreme anxiety in the waiting room. Also extreme anxiety in large groups of people or packed areas. Has had panic attacks in these situations. Does not want to leave the house unless absolutely necessary. Denies thoughts of harm to herself or others.    Review of Systems  Constitutional:  Positive for fatigue.  Respiratory:  Negative for cough, chest tightness, shortness of breath and wheezing.   Cardiovascular:  Negative for chest pain.  Psychiatric/Behavioral:  Positive for decreased concentration, dysphoric mood and sleep disturbance. Negative for self-injury and suicidal ideas. The patient is nervous/anxious.       09/16/2024    3:46 PM  Depression screen PHQ 2/9  Decreased Interest 3  Down, Depressed, Hopeless 3  PHQ - 2 Score 6  Altered sleeping 3  Tired, decreased energy 3  Change in appetite 1  Feeling bad or failure about yourself  3  Trouble concentrating 3  Moving slowly or fidgety/restless 1  Suicidal thoughts 0  PHQ-9 Score 20  Difficult doing work/chores Very difficult      09/16/2024    3:47 PM 08/24/2024    1:23 PM 07/26/2024   11:33 AM 12/22/2023    4:22 PM  GAD 7 : Generalized Anxiety Score  Nervous, Anxious, on Edge 3 3 3 1   Control/stop worrying 3 3 3 1   Worry too much - different things 3 3 3 1   Trouble relaxing 3 3 3 1   Restless 0 1 0 0  Easily  annoyed or irritable 0 3 3 0  Afraid - awful might happen 3 3 3 1   Total GAD 7 Score 15 19 18 5   Anxiety Difficulty Extremely difficult Very difficult Extremely difficult Somewhat difficult    Social History   Tobacco Use   Smoking status: Never   Smokeless tobacco: Never  Substance Use Topics   Alcohol use: Yes    Comment: occassionally   Drug use: No        Objective:   Physical Exam Vitals and nursing note reviewed.  Constitutional:      General: She is not in acute distress. Cardiovascular:     Rate and Rhythm: Normal rate and regular rhythm.  Pulmonary:     Effort: Pulmonary effort is normal.     Breath sounds: Normal breath sounds.  Neurological:     Mental Status: She is alert and oriented to person, place, and time.  Psychiatric:        Attention and Perception: Attention and perception normal.        Mood and Affect: Affect normal. Mood is anxious.        Speech: Speech normal.        Behavior: Behavior normal. Behavior is cooperative.        Thought Content: Thought content normal.        Cognition and Memory: Cognition normal.        Judgment:  Judgment normal.     Comments: Making good eye contact. Dressed appropriately for the weather. Good hygiene.     Today's Vitals   09/16/24 1545  BP: 129/82  Pulse: 60  SpO2: 99%  Weight: 139 lb (63 kg)  Height: 5' (1.524 m)   Body mass index is 27.15 kg/m.        Assessment & Plan:  Depression with anxiety  Social anxiety disorder  Agoraphobia  Meds ordered this encounter  Medications   busPIRone  (BUSPAR ) 10 MG tablet    Sig: Take 1 tablet (10 mg total) by mouth 2 (two) times daily.    Dispense:  60 tablet    Refill:  0    Supervising Provider:   ALPHONSA HAMILTON A [[id number]]   Increase Buspar  to 10 mg BID. Continue other medications as directed.  Discussed return to work on 10/19/23. Patient will have company send short term disability papers for completion. Verbally agrees to seek help immediately if  any thoughts of self harm. Continue counseling. Consider CBT to help severe anxiety.  Return for Follow as needed for FMLA.

## 2024-09-21 NOTE — Telephone Encounter (Signed)
 Papers completed and sent to front desk.

## 2024-10-03 ENCOUNTER — Other Ambulatory Visit: Payer: Self-pay | Admitting: Family Medicine

## 2024-10-03 DIAGNOSIS — F411 Generalized anxiety disorder: Secondary | ICD-10-CM

## 2024-10-04 ENCOUNTER — Ambulatory Visit: Admitting: Nurse Practitioner

## 2024-10-08 ENCOUNTER — Other Ambulatory Visit: Payer: Self-pay | Admitting: Family Medicine

## 2024-10-08 NOTE — Telephone Encounter (Signed)
 Copied from CRM #[id number]. Topic: Clinical - Medication Refill >> Oct 08, 2024  2:29 PM Donna E wrote: Medication: Eszopiclone  3 MG TABS  Has the patient contacted their pharmacy? Yes Pharmacy stated to call provider  This is the patient's preferred pharmacy:   [facility] [id number] - [place], [place] - [address] AT [facility] OF [address] & [address] [place] [address] Phone: [phone] Fax: [phone]   Is this the correct pharmacy for this prescription? Yes If no, delete pharmacy and type the correct one.   Has the prescription been filled recently? Yes   Is the patient out of the medication? Yes  Has the patient been seen for an appointment in the last year OR does the patient have an upcoming appointment? Yes  Can we respond through MyChart? Yes  Agent: Please be advised that Rx refills may take up to 3 business days. We ask that you follow-up with your pharmacy.

## 2024-10-11 ENCOUNTER — Encounter: Payer: Self-pay | Admitting: Nurse Practitioner

## 2024-10-11 MED ORDER — ESZOPICLONE 3 MG PO TABS: ORAL_TABLET | ORAL | 5 refills | Status: DC

## 2024-10-15 ENCOUNTER — Ambulatory Visit: Admitting: Nurse Practitioner

## 2024-10-15 VITALS — BP 129/85 | HR 77 | Temp 98.2°F | Ht 60.0 in | Wt 135.5 lb

## 2024-10-15 DIAGNOSIS — F4000 Agoraphobia, unspecified: Secondary | ICD-10-CM | POA: Diagnosis not present

## 2024-10-15 DIAGNOSIS — R110 Nausea: Secondary | ICD-10-CM

## 2024-10-15 DIAGNOSIS — F4010 Social phobia, unspecified: Secondary | ICD-10-CM

## 2024-10-15 DIAGNOSIS — F5104 Psychophysiologic insomnia: Secondary | ICD-10-CM | POA: Diagnosis not present

## 2024-10-15 DIAGNOSIS — F418 Other specified anxiety disorders: Secondary | ICD-10-CM

## 2024-10-15 MED ORDER — ESZOPICLONE 3 MG PO TABS: ORAL_TABLET | ORAL | 0 refills | Status: DC

## 2024-10-15 MED ORDER — PROMETHAZINE HCL 25 MG PO TABS: 25.0000 mg | ORAL_TABLET | Freq: Three times a day (TID) | ORAL | 0 refills | Status: AC | PRN

## 2024-10-16 ENCOUNTER — Encounter: Payer: Self-pay | Admitting: Nurse Practitioner

## 2024-10-16 NOTE — Progress Notes (Signed)
 "  Subjective:    Patient ID: Molly Ryan, female    DOB: [birth year], 64 y.o.   MRN: [id number]  HPI Discussed the use of AI scribe software for clinical note transcription with the patient, who gave verbal consent to proceed.  History of Present Illness Molly Ryan is a 64 year old female who presents with mental health concerns and medication side effects.  She has been experiencing ongoing mental health challenges related to her work environment, which she describes as very stressful. She feels overwhelmed by the emotional demands of her job, particularly in dealing with patient loss, and has been experiencing increased emotional distress over the past two years. She has been meeting with a counselor weekly and is considering alternative employment options, including remote work.  She is currently taking citalopram  and was recently started on Buspar , which causes a 'residual headache' immediately after taking it. The headache occurs with both the initial and increased doses of Buspar .  She has significant sleep disturbances. Recently woke up physically sick, vomiting, and experiencing an incident where she fell and injured herself, resulting in minor bruises. During this incident, she lost her sleep medication, Lunesta , and has been without it for a week, relying on over-the-counter alternatives. She also lost her anxiety medication and meclizine  during the incident. She was vomiting in her bedroom and when she fell the medication fell into the vomitus in the nearby trash can. This was due to a viral illness. Requesting a refill on Phenergan  to have on hand.   Her sleep quality remains poor, averaging only a few hours of non-restorative sleep per night. She also experiences nausea, which she attributes to Mounjaro , and uses Zofran  to manage mild nausea at times.     10/15/2024    1:11 PM  Depression screen PHQ 2/9  Decreased Interest 3  Down, Depressed, Hopeless 2  PHQ - 2 Score 5   Altered sleeping 3  Tired, decreased energy 2  Change in appetite 2  Feeling bad or failure about yourself  2  Trouble concentrating 3  Moving slowly or fidgety/restless 1  Suicidal thoughts 0  PHQ-9 Score 18  Difficult doing work/chores Very difficult      09/16/2024    3:47 PM 08/24/2024    1:23 PM 07/26/2024   11:33 AM 12/22/2023    4:22 PM  GAD 7 : Generalized Anxiety Score  Nervous, Anxious, on Edge 3 3 3 1   Control/stop worrying 3 3 3 1   Worry too much - different things 3 3 3 1   Trouble relaxing 3 3 3 1   Restless 0 1 0 0  Easily annoyed or irritable 0 3 3 0  Afraid - awful might happen 3 3 3 1   Total GAD 7 Score 15 19 18 5   Anxiety Difficulty Extremely difficult Very difficult Extremely difficult Somewhat difficult    Social History[1]      Objective:   Physical Exam Vitals and nursing note reviewed.  Constitutional:      General: She is not in acute distress. Cardiovascular:     Rate and Rhythm: Normal rate and regular rhythm.  Pulmonary:     Effort: Pulmonary effort is normal.     Breath sounds: Normal breath sounds.  Neurological:     Mental Status: She is alert and oriented to person, place, and time.  Psychiatric:        Attention and Perception: Attention and perception normal.        Mood and Affect:  Affect normal. Mood is anxious.        Speech: Speech normal.        Behavior: Behavior normal. Behavior is cooperative.        Thought Content: Thought content normal.        Cognition and Memory: Cognition and memory normal.        Judgment: Judgment normal.     Comments: In general patient continues to improve as far as the level of anxiety that is exhibited during her office visits.  Making good eye contact.  Dressed appropriately for the weather.    Today's Vitals   10/15/24 1309  BP: 129/85  Pulse: 77  Temp: 98.2 F (36.8 C)  SpO2: 95%  Weight: 135 lb 8 oz (61.5 kg)  Height: 5' (1.524 m)   Body mass index is 26.46 kg/m.        Assessment & Plan:  1. Depression with anxiety (Primary) - Discontinued Buspar  due to headaches. - Continue Escitalopram 20 mg oral daily. - Continue counseling sessions weekly.  2. Social anxiety disorder - Discontinued Buspar  due to headaches. - Continue Escitalopram 20 mg oral daily. - Continue counseling sessions weekly.  3. Chronic insomnia - Refilled Lunesta  prescription early, potential out-of-pocket cost. - Eszopiclone  3 MG TABS; 1 nightly as needed  Dispense: 30 tablet; Refill: 0  4. Agoraphobia - Discontinued Buspar  due to headaches. - Continue Escitalopram 20 mg oral daily. - Continue counseling sessions weekly.  5. Nausea  - promethazine  (PHENERGAN ) 25 MG tablet; Take 1 tablet (25 mg total) by mouth every 8 (eight) hours as needed for nausea or vomiting.  Dispense: 20 tablet; Refill: 0  Return in about 3 months (around 01/13/2025).        [1]  Social History Tobacco Use   Smoking status: Never   Smokeless tobacco: Never  Substance Use Topics   Alcohol use: Yes    Comment: occassionally   Drug use: No   "

## 2024-10-23 ENCOUNTER — Encounter: Payer: Self-pay | Admitting: Family Medicine

## 2024-10-24 ENCOUNTER — Encounter: Payer: Self-pay | Admitting: Nurse Practitioner

## 2024-10-25 ENCOUNTER — Other Ambulatory Visit: Payer: Self-pay | Admitting: Family Medicine

## 2024-10-26 NOTE — Telephone Encounter (Signed)
 Please let the patient know that I will call over there but controlled medications are under wheels from the state medical board as well as pharmacies In addition to this insurance companies also limit prescriptions that they pay for  Unfortunately I offer no guarantees that they will allow for medication to be granted early  (Certainly as unpleasant as fishing medication out of a trash can would be-it is almost always better to fish it out then to rely on insurance companies and pharmacy and state medical boards of pending their rules)  We will talk with the pharmacy see what can be done  Question-was this every single medication or just the controlled meds?

## 2024-10-27 ENCOUNTER — Other Ambulatory Visit: Payer: Self-pay | Admitting: Family Medicine

## 2024-10-27 DIAGNOSIS — F5104 Psychophysiologic insomnia: Secondary | ICD-10-CM

## 2024-10-27 DIAGNOSIS — F430 Acute stress reaction: Secondary | ICD-10-CM

## 2024-10-27 MED ORDER — CLONAZEPAM 0.5 MG PO TABS: ORAL_TABLET | ORAL | 0 refills | Status: AC

## 2024-10-27 MED ORDER — ESZOPICLONE 3 MG PO TABS: ORAL_TABLET | ORAL | 3 refills | Status: AC

## 2024-11-08 ENCOUNTER — Ambulatory Visit: Admitting: Nurse Practitioner

## 2024-11-09 ENCOUNTER — Telehealth: Payer: Self-pay | Admitting: Nurse Practitioner

## 2024-11-09 NOTE — Telephone Encounter (Signed)
 Reason for CRM: Dorthea from [place] calling to speak to provider concerning short term disability paperwork , and requests a return call concerning this [phone], fax number [phone]   This has been the second requesting from [facility] on patient first they wanted recent office notes now they sent over another wanting date of patient to be out of work, date of next office visit and estimated return to work date . 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

## 2024-11-11 ENCOUNTER — Encounter: Payer: Self-pay | Admitting: Nurse Practitioner

## 2024-11-17 ENCOUNTER — Encounter: Payer: Self-pay | Admitting: Nurse Practitioner

## 2024-11-17 ENCOUNTER — Ambulatory Visit: Admitting: Nurse Practitioner

## 2024-11-17 VITALS — BP 122/76 | HR 64 | Ht 60.0 in | Wt 135.0 lb

## 2024-11-17 DIAGNOSIS — Z7984 Long term (current) use of oral hypoglycemic drugs: Secondary | ICD-10-CM | POA: Diagnosis not present

## 2024-11-17 DIAGNOSIS — E782 Mixed hyperlipidemia: Secondary | ICD-10-CM | POA: Diagnosis not present

## 2024-11-17 DIAGNOSIS — E119 Type 2 diabetes mellitus without complications: Secondary | ICD-10-CM | POA: Diagnosis not present

## 2024-11-17 DIAGNOSIS — Z7985 Long-term (current) use of injectable non-insulin antidiabetic drugs: Secondary | ICD-10-CM | POA: Diagnosis not present

## 2024-11-17 LAB — POCT GLYCOSYLATED HEMOGLOBIN (HGB A1C): Hemoglobin A1C: 5.5 % (ref 4.0–5.6)

## 2024-11-17 MED ORDER — MOUNJARO 10 MG/0.5ML ~~LOC~~ SOAJ: 10.0000 mg | SUBCUTANEOUS | 3 refills | Status: AC

## 2024-11-17 NOTE — Progress Notes (Signed)
 "                                                                        Endocrinology Follow Up Note       11/17/2024, 1:46 PM   Subjective:    Patient ID: Molly Ryan, female    DOB: 01/17/1961.  Molly Ryan is being seen in follow up after being seen in consultation for management of currently uncontrolled symptomatic diabetes requested by  Alphonsa Glendia LABOR, MD.   Past Medical History:  Diagnosis Date   Chronic insomnia    Migraine headache    Reflux     Past Surgical History:  Procedure Laterality Date   APPENDECTOMY     CHOLECYSTECTOMY     LAPAROSCOPIC BILATERAL SALPINGO OOPHERECTOMY Bilateral 2014   VAGINAL HYSTERECTOMY  2006    Social History   Socioeconomic History   Marital status: Married    Spouse name: Not on file   Number of children: Not on file   Years of education: Not on file   Highest education level: Associate degree: occupational, scientist, product/process development, or vocational program  Occupational History   Not on file  Tobacco Use   Smoking status: Never   Smokeless tobacco: Never  Substance and Sexual Activity   Alcohol use: Yes    Comment: occassionally   Drug use: No   Sexual activity: Yes  Other Topics Concern   Not on file  Social History Narrative   Not on file   Social Drivers of Health   Tobacco Use: Low Risk (11/17/2024)   Patient History    Smoking Tobacco Use: Never    Smokeless Tobacco Use: Never    Passive Exposure: Not on file  Financial Resource Strain: Medium Risk (10/11/2024)   Overall Financial Resource Strain (CARDIA)    Difficulty of Paying Living Expenses: Somewhat hard  Food Insecurity: No Food Insecurity (10/11/2024)   Epic    Worried About Radiation Protection Practitioner of Food in the Last Year: Never true    Ran Out of Food in the Last Year: Never true  Transportation Needs: No Transportation Needs (10/11/2024)   Epic    Lack of Transportation (Medical): No    Lack of Transportation (Non-Medical): No  Physical Activity: Insufficiently  Active (10/11/2024)   Exercise Vital Sign    Days of Exercise per Week: 1 day    Minutes of Exercise per Session: 10 min  Stress: Stress Concern Present (10/11/2024)   [employer] of Occupational Health - Occupational Stress Questionnaire    Feeling of Stress: Very much  Social Connections: Unknown (10/11/2024)   Social Connection and Isolation Panel    Frequency of Communication with Friends and Family: Once a week    Frequency of Social Gatherings with Friends and Family: Patient declined    Attends Religious Services: More than 4 times per year    Active Member of Clubs or Organizations: Yes    Attends Banker Meetings: More than 4 times per year    Marital Status: Married  Depression (PHQ2-9): High Risk (10/15/2024)   Depression (PHQ2-9)    PHQ-2 Score: 18  Alcohol Screen: Not on file  Housing: Patient Declined (10/11/2024)   Epic    Unable to  Pay for Housing in the Last Year: Patient declined    Number of Times Moved in the Last Year: Not on file    Homeless in the Last Year: Patient declined  Utilities: Not on file  Health Literacy: Not on file    Family History  Problem Relation Age of Onset   Hypertension Brother    Cancer Brother        lung   Cancer Mother        lung   COPD Mother    Diabetes Maternal Grandmother     Outpatient Encounter Medications as of 11/17/2024  Medication Sig   citalopram  (CELEXA ) 20 MG tablet TAKE 1 TABLET(20 MG) BY MOUTH DAILY   clonazePAM  (KLONOPIN ) 0.5 MG tablet TAKE 1/2 TO 1 TABLET BY MOUTH TWICE DAILY DURING THE DAY AS NEEDED FOR SEVERE ANXIETY. DO NOT TAKE WITH LUNESTA    Eszopiclone  3 MG TABS 1 nightly as needed   meclizine  (ANTIVERT ) 25 MG tablet Take one tablet every 6 hours prn dizziness. Caution drowiness   ondansetron  (ZOFRAN ) 8 MG tablet Take 1 tablet (8 mg total) by mouth every 8 (eight) hours as needed for nausea or vomiting.   promethazine  (PHENERGAN ) 25 MG tablet Take 1 tablet (25 mg total) by mouth every  8 (eight) hours as needed for nausea or vomiting.   [DISCONTINUED] MOUNJARO  10 MG/0.5ML Pen ADMINISTER 10 MG UNDER THE SKIN ONCE A WEEK.   tirzepatide  (MOUNJARO ) 10 MG/0.5ML Pen Inject 10 mg into the skin once a week.   No facility-administered encounter medications on file as of 11/17/2024.    ALLERGIES: Allergies  Allergen Reactions   Percocet [Oxycodone -Acetaminophen ] Anaphylaxis   Morphine And Codeine     Muscle spasms   Ozempic  (0.25 Or 0.5 Mg-Dose) [Semaglutide (0.25 Or 0.5mg -Dos)]     Vision changes   Amoxicillin Rash   Buspirone  Hcl Other (See Comments)    Headaches    VACCINATION STATUS: Immunization History  Administered Date(s) Administered   Influenza,inj,Quad PF,6+ Mos 09/11/2015, 11/15/2016, 08/22/2017, 06/30/2020   Influenza-Unspecified 08/01/2018, 07/29/2019, 08/05/2022   PFIZER(Purple Top)SARS-COV-2 Vaccination 10/25/2019, 11/25/2019   PNEUMOCOCCAL CONJUGATE-20 08/20/2021   Pneumococcal Polysaccharide-23 11/15/2016    Diabetes She presents for her follow-up diabetic visit. She has type 2 diabetes mellitus. Onset time: diagnosed at approx age of 36. Her disease course has been stable. There are no hypoglycemic associated symptoms. There are no hypoglycemic complications. There are no diabetic complications. Risk factors for coronary artery disease include diabetes mellitus, dyslipidemia, obesity, sedentary lifestyle and post-menopausal. Current diabetic treatments: Mounjaro . She is compliant with treatment most of the time (cut back on Metformin  to once weekly (says causing her constipation)). Her weight is fluctuating minimally. She is following a generally healthy diet. When asked about meal planning, she reported none. She has not had a previous visit with a dietitian. She participates in exercise intermittently. (She presents today with no meter or logs to review (does not need to routinely monitor given safe regimen).  Her POCT A1c today is 5.5%, essentially unchanged  from last visit.  She has done well managing without the Metformin .) An ACE inhibitor/angiotensin II receptor blocker is not being taken. She does not see a podiatrist.Eye exam is current.     Review of systems  Constitutional: + Minimally fluctuating body weight,  current Body mass index is 26.37 kg/m. , no fatigue, no subjective hyperthermia, no subjective hypothermia Eyes: no blurry vision, no xerophthalmia ENT: no sore throat, no nodules palpated in throat, no dysphagia/odynophagia, no hoarseness  Cardiovascular: no chest pain, no shortness of breath, no palpitations, no leg swelling Respiratory: no cough, no shortness of breath Gastrointestinal: no nausea/vomiting/diarrhea Musculoskeletal: no muscle/joint aches Skin: no rashes, no hyperemia Neurological: no tremors, no numbness, no tingling, no dizziness Psychiatric: no depression, no anxiety  Objective:     BP 122/76 (BP Location: Left Arm, Patient Position: Sitting, Cuff Size: Large)   Pulse 64   Ht 5' (1.524 m)   Wt 135 lb (61.2 kg)   BMI 26.37 kg/m   Wt Readings from Last 3 Encounters:  11/17/24 135 lb (61.2 kg)  10/15/24 135 lb 8 oz (61.5 kg)  09/16/24 139 lb (63 kg)     BP Readings from Last 3 Encounters:  11/17/24 122/76  10/15/24 129/85  09/16/24 129/82     Physical Exam- Limited  Constitutional:  Body mass index is 26.37 kg/m. , not in acute distress, normal state of mind Eyes:  EOMI, no exophthalmos Musculoskeletal: no gross deformities, strength intact in all four extremities, no gross restriction of joint movements Skin:  no rashes, no hyperemia Neurological: no tremor with outstretched hands   Diabetic Foot Exam - Simple   No data filed     CMP ( most recent) CMP     Component Value Date/Time   NA 138 07/22/2024 0805   K 4.1 07/22/2024 0805   CL 98 07/22/2024 0805   CO2 23 07/22/2024 0805   GLUCOSE 106 (H) 07/22/2024 0805   GLUCOSE 116 (H) 08/25/2023 1338   BUN 11 07/22/2024 0805    CREATININE 0.78 07/22/2024 0805   CALCIUM  10.0 07/22/2024 0805   PROT 7.2 07/22/2024 0805   ALBUMIN 4.6 07/22/2024 0805   AST 15 07/22/2024 0805   ALT 12 07/22/2024 0805   ALKPHOS 64 07/22/2024 0805   BILITOT 0.6 07/22/2024 0805   GFRNONAA >60 08/25/2023 1338   GFRAA 113 06/16/2020 0754     Diabetic Labs (most recent): Lab Results  Component Value Date   HGBA1C 5.5 11/17/2024   HGBA1C 5.6 05/07/2024   HGBA1C 5.9 (A) 11/07/2023     Lipid Panel ( most recent) Lipid Panel     Component Value Date/Time   CHOL 169 07/22/2024 0805   TRIG 61 07/22/2024 0805   HDL 79 07/22/2024 0805   CHOLHDL 2.1 07/22/2024 0805   CHOLHDL 2.9 01/21/2014 0945   VLDL 23 01/21/2014 0945   LDLCALC 78 07/22/2024 0805   LABVLDL 12 07/22/2024 0805      Lab Results  Component Value Date   TSH 0.911 03/28/2018   TSH 1.270 11/15/2016   TSH 1.073 01/21/2014           Assessment & Plan:   1) Type 2 diabetes mellitus without complication, without long-term current use of insulin   She presents today with no meter or logs to review (does not need to routinely monitor given safe regimen).  Her POCT A1c today is 5.5%, essentially unchanged from last visit.  She has done well managing without the Metformin .  - Molly Ryan has currently uncontrolled symptomatic type 2 DM since 64 years of age.   -Recent labs reviewed.  - I had a long discussion with her about the progressive nature of diabetes and the pathology behind its complications. -her diabetes is not currently complicated but she remains at a high risk for more acute and chronic complications which include CAD, CVA, CKD, retinopathy, and neuropathy. These are all discussed in detail with her.  The following Lifestyle Medicine recommendations according  to [employer] of Lifestyle Medicine [facility]) were discussed and offered to patient and she agrees to start the journey:  A. Whole Foods, Plant-based plate comprising of fruits and  vegetables, plant-based proteins, whole-grain carbohydrates was discussed in detail with the patient.   A list for source of those nutrients were also provided to the patient.  Patient will use only water or unsweetened tea for hydration. B.  The need to stay away from risky substances including alcohol, smoking; obtaining 7 to 9 hours of restorative sleep, at least 150 minutes of moderate intensity exercise weekly, the importance of healthy social connections,  and stress reduction techniques were discussed. C.  A full color page of  Calorie density of various food groups per pound showing examples of each food groups was provided to the patient.  - Nutritional counseling repeated/built upon at each appointment.  - The patient admits there is a room for improvement in their diet and drink choices. -  Suggestion is made for the patient to avoid simple carbohydrates from their diet including Cakes, Sweet Desserts / Pastries, Ice Cream, Soda (diet and regular), Sweet Tea, Candies, Chips, Cookies, Sweet Pastries, Store Bought Juices, Alcohol in Excess of 1-2 drinks a day, Artificial Sweeteners, Coffee Creamer, and Sugar-free Products. This will help patient to have stable blood glucose profile and potentially avoid unintended weight gain.   - I encouraged the patient to switch to unprocessed or minimally processed complex starch and increased protein intake (animal or plant source), fruits, and vegetables.   - Patient is advised to stick to a routine mealtimes to eat 3 meals a day and avoid unnecessary snacks (to snack only to correct hypoglycemia).  - I have approached her with the following individualized plan to manage her diabetes and patient agrees:   -Based on her stable, at goal glycemic profile, no changes will be made to her regimen today.  She is advised to continue her Mounjaro  10 mg SQ weekly.    -she can take a break from routine monitoring given safe regimen.  - Adjustment parameters  are given to her for hypo and hyperglycemia in writing.  - Specific targets for  A1c; LDL, HDL, and Triglycerides were discussed with the patient.  2) Blood Pressure /Hypertension:  her blood pressure is controlled to target without any antihypertensive medications.    3) Lipids/Hyperlipidemia:    Review of her recent lipid panel from 06/24/23 showed controlled LDL at 50, improving greatly.  she is advised to continue Crestor  20 mg daily at bedtime.  Side effects and precautions discussed with her.  She has upcoming appt with PCP for annual physical.  Will defer labs to them.  4)  Weight/Diet:  her Body mass index is 26.37 kg/m.  -  clearly complicating her diabetes care.   she is a candidate for weight loss. I discussed with her the fact that loss of 5 - 10% of her  current body weight will have the most impact on her diabetes management.  Exercise, and detailed carbohydrates information provided  -  detailed on discharge instructions.  5) Chronic Care/Health Maintenance: -she is not on ACEI/ARB and is on Statin medications and is encouraged to initiate and continue to follow up with Ophthalmology, Dentist, Podiatrist at least yearly or according to recommendations, and advised to stay away from smoking. I have recommended yearly flu vaccine and pneumonia vaccine at least every 5 years; moderate intensity exercise for up to 150 minutes weekly; and sleep for at least 7  hours a day.  - she is advised to maintain close follow up with Alphonsa Glendia LABOR, MD for primary care needs, as well as her other providers for optimal and coordinated care.     I spent  30  minutes in the care of the patient today including review of labs from CMP, Lipids, Thyroid  Function, Hematology (current and previous including abstractions from other facilities); face-to-face time discussing  her blood glucose readings/logs, discussing hypoglycemia and hyperglycemia episodes and symptoms, medications doses, her options of  short and long term treatment based on the latest standards of care / guidelines;  discussion about incorporating lifestyle medicine;  and documenting the encounter. Risk reduction counseling performed per USPSTF guidelines to reduce obesity and cardiovascular risk factors.     Please refer to Patient Instructions for Blood Glucose Monitoring and Insulin /Medications Dosing Guide  in media tab for additional information. Please  also refer to  Patient Self Inventory in the Media  tab for reviewed elements of pertinent patient history.  Molly Ryan participated in the discussions, expressed understanding, and voiced agreement with the above plans.  All questions were answered to her satisfaction. she is encouraged to contact clinic should she have any questions or concerns prior to her return visit.    Follow up plan: - Return in about 6 months (around 05/17/2025) for Diabetes F/U with A1c in office, No previsit labs.   Benton Rio, [facility] [address] [place], [place] [address] Phone: [phone] Fax: [phone]   11/17/2024, 1:46 PM    "

## 2024-12-28 ENCOUNTER — Ambulatory Visit: Admitting: Nurse Practitioner

## 2024-12-31 ENCOUNTER — Ambulatory Visit: Admitting: Family Medicine

## 2025-05-17 ENCOUNTER — Ambulatory Visit: Admitting: Nurse Practitioner
